# Patient Record
Sex: Male | Born: 1950
Health system: Southern US, Community
[De-identification: ages and names within clinical notes are randomized; demographics above are authoritative.]

## PROBLEM LIST (undated history)

## (undated) DIAGNOSIS — E669 Obesity, unspecified: Secondary | ICD-10-CM

## (undated) DIAGNOSIS — H409 Unspecified glaucoma: Secondary | ICD-10-CM

## (undated) DIAGNOSIS — H269 Unspecified cataract: Secondary | ICD-10-CM

## (undated) DIAGNOSIS — I714 Abdominal aortic aneurysm, without rupture, unspecified: Secondary | ICD-10-CM

## (undated) DIAGNOSIS — F410 Panic disorder [episodic paroxysmal anxiety] without agoraphobia: Secondary | ICD-10-CM

## (undated) DIAGNOSIS — E1142 Type 2 diabetes mellitus with diabetic polyneuropathy: Principal | ICD-10-CM

## (undated) DIAGNOSIS — E785 Hyperlipidemia, unspecified: Secondary | ICD-10-CM

## (undated) DIAGNOSIS — K219 Gastro-esophageal reflux disease without esophagitis: Secondary | ICD-10-CM

## (undated) DIAGNOSIS — R0683 Snoring: Secondary | ICD-10-CM

## (undated) DIAGNOSIS — F329 Major depressive disorder, single episode, unspecified: Secondary | ICD-10-CM

## (undated) DIAGNOSIS — R7989 Other specified abnormal findings of blood chemistry: Secondary | ICD-10-CM

## (undated) DIAGNOSIS — E119 Type 2 diabetes mellitus without complications: Secondary | ICD-10-CM

## (undated) HISTORY — DX: Major depressive disorder, single episode, unspecified: F32.9

## (undated) HISTORY — DX: Panic disorder (episodic paroxysmal anxiety): F41.0

## (undated) HISTORY — PX: OTHER SURGICAL HISTORY: SHX169

## (undated) HISTORY — DX: Snoring: R06.83

## (undated) HISTORY — DX: Abdominal aortic aneurysm, without rupture, unspecified: I71.40

## (undated) HISTORY — DX: Type 2 diabetes mellitus without complications: E11.9

## (undated) HISTORY — DX: Unspecified glaucoma: H40.9

## (undated) HISTORY — DX: Other specified abnormal findings of blood chemistry: R79.89

## (undated) HISTORY — DX: Hyperlipidemia, unspecified: E78.5

## (undated) HISTORY — DX: Obesity, unspecified: E66.9

## (undated) HISTORY — DX: Type 2 diabetes mellitus with diabetic polyneuropathy: E11.42

## (undated) HISTORY — DX: Unspecified cataract: H26.9

## (undated) HISTORY — DX: Gastro-esophageal reflux disease without esophagitis: K21.9

---

## 2001-03-17 ENCOUNTER — Emergency Department (HOSPITAL_COMMUNITY): Admission: EM | Admit: 2001-03-17 | Discharge: 2001-03-17 | Payer: Self-pay | Admitting: Emergency Medicine

## 2001-03-27 ENCOUNTER — Emergency Department (HOSPITAL_COMMUNITY): Admission: EM | Admit: 2001-03-27 | Discharge: 2001-03-27 | Payer: Self-pay | Admitting: Emergency Medicine

## 2004-03-13 ENCOUNTER — Ambulatory Visit: Admission: RE | Admit: 2004-03-13 | Discharge: 2004-03-13 | Payer: Self-pay | Admitting: Family Medicine

## 2004-03-20 ENCOUNTER — Ambulatory Visit: Admission: RE | Admit: 2004-03-20 | Discharge: 2004-03-20 | Payer: Self-pay | Admitting: Family Medicine

## 2011-09-14 ENCOUNTER — Other Ambulatory Visit: Payer: Self-pay | Admitting: Physician Assistant

## 2011-09-14 ENCOUNTER — Other Ambulatory Visit: Payer: Self-pay | Admitting: Family Medicine

## 2011-09-23 ENCOUNTER — Telehealth: Payer: Self-pay | Admitting: Family Medicine

## 2011-09-23 ENCOUNTER — Ambulatory Visit: Payer: Self-pay | Admitting: Family Medicine

## 2011-09-23 DIAGNOSIS — E785 Hyperlipidemia, unspecified: Secondary | ICD-10-CM | POA: Insufficient documentation

## 2011-09-23 DIAGNOSIS — E119 Type 2 diabetes mellitus without complications: Secondary | ICD-10-CM | POA: Insufficient documentation

## 2011-09-23 DIAGNOSIS — E291 Testicular hypofunction: Secondary | ICD-10-CM | POA: Insufficient documentation

## 2011-09-23 DIAGNOSIS — Z021 Encounter for pre-employment examination: Secondary | ICD-10-CM

## 2011-09-23 DIAGNOSIS — Z1211 Encounter for screening for malignant neoplasm of colon: Secondary | ICD-10-CM

## 2011-09-23 LAB — POCT GLYCOSYLATED HEMOGLOBIN (HGB A1C): Hemoglobin A1C: 6.5

## 2011-09-23 LAB — IFOBT (OCCULT BLOOD): IFOBT: NEGATIVE

## 2011-09-23 LAB — PSA: PSA: 1.4 ng/mL (ref <=4.00)

## 2011-09-23 MED ORDER — TESTOSTERONE CYPIONATE 200 MG/ML IM SOLN: 200.0000 mg | INTRAMUSCULAR | Status: DC

## 2011-09-23 MED ORDER — METFORMIN HCL 500 MG PO TABS: 500.0000 mg | ORAL_TABLET | ORAL | Status: DC

## 2011-09-23 MED ORDER — PRAVASTATIN SODIUM 40 MG PO TABS: 40.0000 mg | ORAL_TABLET | Freq: Every day | ORAL | Status: AC

## 2011-09-23 NOTE — Progress Notes (Signed)
  Patient Name: Randy Orr Date of Birth: [birth year] Medical Record Number: [id number] Gender: male Date of Encounter: 09/23/2011  History of Present Illness:  Randy Orr is a 61 y.o. very pleasant male patient who presents with the following:  Here for a physical exam/ follow- up of DM and hypogonadism.  Is having a very hard time with his employment status- has a part- time job but is trying very hard to try something else.  He has applied for a substitute teaching job and hopes this will turn into something permanent.  Finances are very difficult for him and he is currently living with his mother.  He hopes very much that things will improve for him soon.   Patient Active Problem List  Diagnoses  . Diabetes mellitus  . Hypogonadism male  . Hyperlipidemia   Past Medical History  Diagnosis Date  . Panic attacks   . Depression    History reviewed. No pertinent past surgical history. History  Substance Use Topics  . Smoking status: Former Games developer  . Smokeless tobacco: Never Used  . Alcohol Use: No   History reviewed. No pertinent family history. No Known Allergies  Medication list has been reviewed and updated.  Review of Systems: Feeling pretty good despite his situation.  Has not had any alcohol in about a year and a half.    Physical Examination: Filed Vitals:   09/23/11 0850  BP: 113/77  Pulse: 84  Temp: 98.1 F (36.7 C)  TempSrc: Oral  Resp: 16  Height: 6\' 3"  (1.905 m)  Weight: 233 lb 12.8 oz (106.051 kg)    Body mass index is 29.22 kg/(m^2).  GEN: WDWN, NAD, Non-toxic, A & O x 3 HEENT: Atraumatic, Normocephalic. Neck supple. No masses, No LAD. Ears and Nose: No external deformity. CV: RRR, No M/G/R. No JVD. No thrill. No extra heart sounds. PULM: CTA B, no wheezes, crackles, rhonchi. No retractions. No resp. distress. No accessory muscle use. ABD: S, NT, ND, +BS. No rebound. No HSM. GU: normal penis and scrotum/ testicles.  Prostate smooth, normal  size Extr: No c/c/e NEURO Normal gait.  PSYCH: Normally interactive. Conversant. Not depressed or anxious appearing.  Calm demeanor.   Results for orders placed in visit on 09/23/11  POCT GLYCOSYLATED HEMOGLOBIN (HGB A1C)      Component Value Range   Hemoglobin A1C 6.5    IFOBT (OCCULT BLOOD)      Component Value Range   IFOBT Negative      Assessment and Plan: 1. Diabetes mellitus  POCT glycosylated hemoglobin (Hb A1C), metFORMIN (GLUCOPHAGE) 500 MG tablet  2. Hypogonadism male  PSA, testosterone cypionate (DEPOTESTOTERONE CYPIONATE) 200 MG/ML injection  3. Hyperlipidemia  pravastatin (PRAVACHOL) 40 MG tablet  4. Colon cancer screening  IFOBT POC (occult bld, rslt in office)  5. 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   He cannot currently afford to do a colonoscopy, but is aware that he should have one.  Did FOBT testing today which was negative DM is well controlled- continue once daily metformin Restart statin.  Continue testosterone replacement. PSA- follow level. May need urology consultation if not stable.

## 2011-09-23 NOTE — Telephone Encounter (Signed)
Message copied by Kerney Elbe on Mon Sep 23, 2011  4:27 PM ------      Message from: Abbe Amsterdam C      Created: Mon Sep 23, 2011  3:45 PM       Please give me this paper chart.  Thanks!  JC

## 2011-09-25 ENCOUNTER — Encounter: Payer: Self-pay | Admitting: Family Medicine

## 2012-04-23 ENCOUNTER — Ambulatory Visit: Payer: Self-pay | Admitting: Family Medicine

## 2012-04-23 VITALS — BP 118/82 | HR 85 | Temp 98.4°F | Resp 16 | Ht 75.5 in | Wt 228.6 lb

## 2012-04-23 DIAGNOSIS — E291 Testicular hypofunction: Secondary | ICD-10-CM

## 2012-04-23 DIAGNOSIS — E119 Type 2 diabetes mellitus without complications: Secondary | ICD-10-CM

## 2012-04-23 LAB — POCT GLYCOSYLATED HEMOGLOBIN (HGB A1C): Hemoglobin A1C: 6.1

## 2012-04-23 MED ORDER — TESTOSTERONE CYPIONATE 200 MG/ML IM SOLN: 200.0000 mg | INTRAMUSCULAR | Status: DC

## 2012-04-23 NOTE — Progress Notes (Signed)
[facility] [address], [place] [address] [phone]  Date:  04/23/2012   Name:  Randy Orr   DOB:  27-Aug-1950   MRN:  [id number]  PCP:  No primary provider on file.    Chief Complaint: Follow-up   History of Present Illness:  Randy Orr is a 61 y.o. very pleasant male patient who presents with the following:  Here today to do blood work.  He will be getting insurance in about 2.5 months as he has a new job!  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   He last had his testosterone level checked about a year ago and it was ok.  He last had a CBC and PSA in February of this year  [name] feels well and that his testosterone level is ok- his hypogonadism symptoms are controlled  He does have DM but his A1c is under contol  Patient Active Problem List  Diagnosis  . Diabetes mellitus  . Hypogonadism male  . Hyperlipidemia    Past Medical History  Diagnosis Date  . Panic attacks   . Depression     No past surgical history on file.  History  Substance Use Topics  . Smoking status: Former Games developer  . Smokeless tobacco: Never Used  . Alcohol Use: No    No family history on file.  No Known Allergies  Medication list has been reviewed and updated.  Current Outpatient Prescriptions on File Prior to Visit  Medication Sig Dispense Refill  . metFORMIN (GLUCOPHAGE) 500 MG tablet Take 1 tablet (500 mg total) by mouth 1 day or 1 dose.  90 tablet  3  . pravastatin (PRAVACHOL) 40 MG tablet Take 1 tablet (40 mg total) by mouth daily.  90 tablet  3  . testosterone cypionate (DEPOTESTOTERONE CYPIONATE) 200 MG/ML injection Inject 1 mL (200 mg total) into the muscle every 14 (fourteen) days.  10 mL  5    Review of Systems:  As per HPI- otherwise negative.   Physical Examination: Filed Vitals:   04/23/12 0948  BP: 118/82  Pulse: 85  Temp: 98.4 F (36.9 C)  Resp: 16    Filed Vitals:   04/23/12 0948  Height: 6' 3.5" (1.918 m)  Weight: 228 lb 10.1 oz (103.706 kg)   Body mass index is 28.20 kg/(m^2). Ideal Body Weight: Weight in (lb) to have BMI = 25: 202.3   GEN: WDWN, NAD, Non-toxic, A & O x 3, mild overweight HEENT: Atraumatic, Normocephalic. Neck supple. No masses, No LAD. Ears and Nose: No external deformity. CV: RRR, No M/G/R. No JVD. No thrill. No extra heart sounds. PULM: CTA B, no wheezes, crackles, rhonchi. No retractions. No resp. distress. No accessory muscle use. EXTR: No c/c/e NEURO Normal gait.  PSYCH: Normally interactive. Conversant. Not depressed or anxious appearing.  Calm demeanor.   Results for orders placed in visit on 04/23/12  POCT GLYCOSYLATED HEMOGLOBIN (HGB A1C)      Component Value Range   Hemoglobin A1C 6.1      Assessment and Plan: 1. Diabetes mellitus type II  POCT glycosylated hemoglobin (Hb A1C)  2. Hypogonadism male  testosterone cypionate (DEPOTESTOTERONE CYPIONATE) 200 MG/ML injection   DM is controlled.  We need to recheck lipids/ PSA/ cholesterol/ testosterone.  However, he should have insurance within abou 2 months so we can delay until the .  He will RTC for labs once  his insurance is in place.  Let me know if any problems in the meantime   COPLAND,JESSICA, MD

## 2012-09-02 ENCOUNTER — Encounter: Payer: Self-pay | Admitting: Family Medicine

## 2012-09-02 ENCOUNTER — Ambulatory Visit: Payer: PRIVATE HEALTH INSURANCE

## 2012-09-02 ENCOUNTER — Ambulatory Visit (INDEPENDENT_AMBULATORY_CARE_PROVIDER_SITE_OTHER): Payer: PRIVATE HEALTH INSURANCE | Admitting: Family Medicine

## 2012-09-02 VITALS — BP 114/77 | HR 74 | Temp 98.1°F | Resp 18 | Ht 75.75 in | Wt 224.5 lb

## 2012-09-02 DIAGNOSIS — M79673 Pain in unspecified foot: Secondary | ICD-10-CM

## 2012-09-02 DIAGNOSIS — N529 Male erectile dysfunction, unspecified: Secondary | ICD-10-CM

## 2012-09-02 DIAGNOSIS — B001 Herpesviral vesicular dermatitis: Secondary | ICD-10-CM

## 2012-09-02 DIAGNOSIS — E119 Type 2 diabetes mellitus without complications: Secondary | ICD-10-CM

## 2012-09-02 DIAGNOSIS — M79609 Pain in unspecified limb: Secondary | ICD-10-CM

## 2012-09-02 DIAGNOSIS — E291 Testicular hypofunction: Secondary | ICD-10-CM

## 2012-09-02 DIAGNOSIS — E785 Hyperlipidemia, unspecified: Secondary | ICD-10-CM

## 2012-09-02 LAB — POCT CBC
HCT, POC: 47.1 % (ref 43.5–53.7)
Hemoglobin: 15.2 g/dL (ref 14.1–18.1)
MCH, POC: 31.1 pg (ref 27–31.2)
MCV: 96.3 fL (ref 80–97)
MID (cbc): 0.6 (ref 0–0.9)
RBC: 4.89 M/uL (ref 4.69–6.13)
WBC: 5.6 10*3/uL (ref 4.6–10.2)

## 2012-09-02 LAB — LIPID PANEL
Cholesterol: 153 mg/dL (ref 0–200)
HDL: 37 mg/dL — ABNORMAL LOW (ref >39)
Total CHOL/HDL Ratio: 4.1 Ratio
Triglycerides: 105 mg/dL (ref <150)
VLDL: 21 mg/dL (ref 0–40)

## 2012-09-02 LAB — COMPREHENSIVE METABOLIC PANEL
Alkaline Phosphatase: 39 U/L (ref 39–117)
BUN: 13 mg/dL (ref 6–23)
CO2: 28 mEq/L (ref 19–32)
Glucose, Bld: 100 mg/dL — ABNORMAL HIGH (ref 70–99)
Total Bilirubin: 0.4 mg/dL (ref 0.3–1.2)

## 2012-09-02 MED ORDER — TADALAFIL 5 MG PO TABS: 5.0000 mg | ORAL_TABLET | ORAL | Status: DC | PRN

## 2012-09-02 MED ORDER — METFORMIN HCL 500 MG PO TABS: 500.0000 mg | ORAL_TABLET | ORAL | Status: DC

## 2012-09-02 MED ORDER — TESTOSTERONE CYPIONATE 200 MG/ML IM SOLN: 200.0000 mg | INTRAMUSCULAR | Status: DC

## 2012-09-02 MED ORDER — VALACYCLOVIR HCL 1 G PO TABS: ORAL_TABLET | ORAL | Status: DC

## 2012-09-02 MED ORDER — TADALAFIL 5 MG PO TABS: 5.0000 mg | ORAL_TABLET | Freq: Every day | ORAL | Status: DC

## 2012-09-02 NOTE — Progress Notes (Signed)
[facility] [address], [place] [address] [phone]  Date:  09/02/2012   Name:  Randy Orr   DOB:  05/26/1951   MRN:  [id number]  PCP:  Abbe Amsterdam, MD    Chief Complaint: Follow-up, Chills, Generalized Body Aches, Cough and Mouth Lesions   History of Present Illness:  Randy Orr is a 62 y.o. very pleasant male patient who presents with the following:  Here for PSA/ fasting BW as well as illness today.  He is maintained on testosterone replacement therapy.    He was ill over the weekend with chills, aches, cough and ST.  He did have a fever- subjective. He felt pretty bad but thinks he is getting over it now.  He started to feel better 2 days ago.    He has health insurance now- he is very pleased about this.  He would like to catch up on some health issues.    He had noted lateral right foot pain- he had looked online and thought he might have "cuboid syndrome."  This has been a problem for abut 9 months.  There was no known acute injury.  It is feeling better than it had been.   He is using injectable testosterone right now.  He was finally able to do the injection himself at home.  He uses 200 mg every 2 weeks.  He is not sure if he wants to go back on androgel or not as he is concerned about transfer to others- He also met a new woman and is interested in pursing a relationship. He is concerned about possible contact to others with topical testosterone so he would like to stay with the injectable medication at least for now.    He might like to try some cialis or another similar medication in anticipation of becoming sexually active again.    He does not have heart disease, no history of CP.    He also has a recent cold sore in association with his illness and would like a medication for this if possible  Patient Active Problem List  Diagnosis  . Diabetes mellitus  . Hypogonadism male  . Hyperlipidemia    Past Medical History    Diagnosis Date  . Panic attacks   . Depression   . Low testosterone     History reviewed. No pertinent past surgical history.  History  Substance Use Topics  . Smoking status: Former Games developer  . Smokeless tobacco: Never Used  . Alcohol Use: No    Family History  Problem Relation Age of Onset  . Diabetes Brother     No Known Allergies  Medication list has been reviewed and updated.  Current Outpatient Prescriptions on File Prior to Visit  Medication Sig Dispense Refill  . metFORMIN (GLUCOPHAGE) 500 MG tablet Take 1 tablet (500 mg total) by mouth 1 day or 1 dose.  90 tablet  3  . pravastatin (PRAVACHOL) 40 MG tablet Take 1 tablet (40 mg total) by mouth daily.  90 tablet  3  . testosterone cypionate (DEPOTESTOTERONE CYPIONATE) 200 MG/ML injection Inject 1 mL (200 mg total) into the muscle every 14 (fourteen) days.  10 mL  5    Review of Systems:  As per HPI- otherwise negative.  Physical Examination: Filed Vitals:   09/02/12 0806  BP: 114/77  Pulse: 74  Temp: 98.1 F (36.7 C)  Resp: 18   Filed Vitals:   09/02/12 0806  Height: 6' 3.75" (1.924 m)  Weight: 224 lb 8 oz (101.833 kg)   Body mass index is 27.51 kg/(m^2). Ideal Body Weight: Weight in (lb) to have BMI = 25: 203.6   GEN: WDWN, NAD, Non-toxic, A & O x 3 HEENT: Atraumatic, Normocephalic. Neck supple. No masses, No LAD. Bilateral TM wnl, oropharynx normal.  PEERL,EOMI.   Ears and Nose: No external deformity. CV: RRR, No M/G/R. No JVD. No thrill. No extra heart sounds. PULM: CTA B, no wheezes, crackles, rhonchi. No retractions. No resp. distress. No accessory muscle use. ABD: S, NT, ND, +BS. No rebound. No HSM. EXTR: No c/c/e NEURO Normal gait.  PSYCH: Normally interactive. Conversant. Not depressed or anxious appearing.  Calm demeanor.  Right foot- no tenderness, swelling, or redness noted.  Foot and ankle with normal ROM [facility] reading (PRIMARY) by  Dr. Patsy Lager. Right foot:  Normal RIGHT FOOT COMPLETE -  3+ VIEW  Comparison: Preliminary reading of Dr. Patsy Lager  Findings: Three views of the right foot submitted. No acute fracture or subluxation. No radiopaque foreign body.  IMPRESSION: No acute fracture or subluxation.   Results for orders placed in visit on 09/02/12  POCT CBC      Component Value Range   WBC 5.6  4.6 - 10.2 K/uL   Lymph, poc 1.9  0.6 - 3.4   POC LYMPH PERCENT 33.7  10 - 50 %L   MID (cbc) 0.6  0 - 0.9   POC MID % 11.5  0 - 12 %M   POC Granulocyte 3.1  2 - 6.9   Granulocyte percent 54.8  37 - 80 %G   RBC 4.89  4.69 - 6.13 M/uL   Hemoglobin 15.2  14.1 - 18.1 g/dL   HCT, POC 16.1  09.6 - 53.7 %   MCV 96.3  80 - 97 fL   MCH, POC 31.1  27 - 31.2 pg   MCHC 32.3  31.8 - 35.4 g/dL   RDW, POC 04.5     Platelet Count, POC 234  142 - 424 K/uL   MPV 8.7  0 - 99.8 fL  POCT GLYCOSYLATED HEMOGLOBIN (HGB A1C)      Component Value Range   Hemoglobin A1C 6.2     Assessment and Plan: 1. Hypogonadism male  POCT CBC, PSA, Testosterone, testosterone cypionate (DEPOTESTOTERONE CYPIONATE) 200 MG/ML injection  2. Diabetes mellitus, type 2  POCT glycosylated hemoglobin (Hb A1C), Lipid panel, Comprehensive metabolic panel, metFORMIN (GLUCOPHAGE) 500 MG tablet  3. Foot pain  DG Foot Complete Right  4. Hyperlipidemia  Lipid panel  5. ED (erectile dysfunction)  tadalafil (CIALIS) 5 MG tablet   Await the rest of his labs today.  He is on T replacement, check T level today and refilled his medication DM is very well controlled.  Continue metformin Chronic foot pain.  Normal plain films today.  Offered to refer him to ortho but he would prefer to observe for now.  Let me know if getting worse Continue pravachol and await FLP Will try cialis daily dosing- See patient instructions for more details.   He does not use nitro or have any known CAD Gave valtrex Rx to use prn cold sore  Meds ordered this encounter  Medications  . Multiple Vitamin (MULTIVITAMIN) tablet    Sig: Take 1 tablet  by mouth daily.  Marland Kitchen DISCONTD: tadalafil (CIALIS) 5 MG tablet    Sig: Take 1 tablet (5 mg total) by mouth every other day as needed for erectile dysfunction.    Dispense:  30  tablet    Refill:  11  . metFORMIN (GLUCOPHAGE) 500 MG tablet    Sig: Take 1 tablet (500 mg total) by mouth 1 day or 1 dose.    Dispense:  90 tablet    Refill:  3  . testosterone cypionate (DEPOTESTOTERONE CYPIONATE) 200 MG/ML injection    Sig: Inject 1 mL (200 mg total) into the muscle every 14 (fourteen) days.    Dispense:  10 mL    Refill:  5  . tadalafil (CIALIS) 5 MG tablet    Sig: Take 1 tablet (5 mg total) by mouth daily.    Dispense:  30 tablet    Refill:  11  . valACYclovir (VALTREX) 1000 MG tablet    Sig: Take 2 pills in 2 doses, 12 hours apart.    Dispense:  20 tablet    Refill:  0     He stopped taking his pravachol several weeks ago- he has trouble with leg cramps when taking this medication COPLAND,JESSICA, MD

## 2012-09-02 NOTE — Patient Instructions (Addendum)
I will be in touch with the rest of your labs.  Try taking 1/2 of the daily cialis pill first- if that is not enough try taking a whole pill

## 2012-09-08 ENCOUNTER — Ambulatory Visit (INDEPENDENT_AMBULATORY_CARE_PROVIDER_SITE_OTHER): Payer: PRIVATE HEALTH INSURANCE | Admitting: Family Medicine

## 2012-09-08 VITALS — BP 111/73 | HR 103 | Temp 98.5°F | Resp 16 | Ht 73.5 in | Wt 224.0 lb

## 2012-09-08 DIAGNOSIS — R05 Cough: Secondary | ICD-10-CM

## 2012-09-08 DIAGNOSIS — R059 Cough, unspecified: Secondary | ICD-10-CM

## 2012-09-08 DIAGNOSIS — J310 Chronic rhinitis: Secondary | ICD-10-CM

## 2012-09-08 DIAGNOSIS — J019 Acute sinusitis, unspecified: Secondary | ICD-10-CM

## 2012-09-08 MED ORDER — HYDROCODONE-HOMATROPINE 5-1.5 MG/5ML PO SYRP: 5.0000 mL | ORAL_SOLUTION | Freq: Every evening | ORAL | Status: DC | PRN

## 2012-09-08 MED ORDER — AMOXICILLIN-POT CLAVULANATE 875-125 MG PO TABS: 1.0000 | ORAL_TABLET | Freq: Two times a day (BID) | ORAL | Status: DC

## 2012-09-08 MED ORDER — BENZONATATE 100 MG PO CAPS: 200.0000 mg | ORAL_CAPSULE | Freq: Two times a day (BID) | ORAL | Status: AC | PRN

## 2012-09-08 MED ORDER — IPRATROPIUM BROMIDE 0.03 % NA SOLN: 2.0000 | Freq: Two times a day (BID) | NASAL | Status: DC

## 2012-09-08 NOTE — Progress Notes (Signed)
[facility]:  Office Visit  Chief Complaint:  Chief Complaint  Patient presents with  . Cough  . Sinusitis  . Nasal Congestion    with green mucus    HPI: Randy Orr is a 62 y.o. male who complains of  Cough mostly dry, sinus congestion, mucus is green, fever. Aleve, Claritin and MUcinex D. BIlateral maxillary pain. NO ear pain. No throat pain. Has hoarsness, he is an Radio producer and cannot sing.   Past Medical History  Diagnosis Date  . Panic attacks   . Depression   . Low testosterone    No past surgical history on file. History   Social History  . Marital Status: Divorced    Spouse Name: N/A    Number of Children: N/A  . Years of Education: N/A   Social History Main Topics  . Smoking status: Former Games developer  . Smokeless tobacco: Never Used  . Alcohol Use: No  . Drug Use: No  . Sexually Active: None   Other Topics Concern  . None   Social History Narrative  . None   Family History  Problem Relation Age of Onset  . Diabetes Brother    No Known Allergies Prior to Admission medications   Medication Sig Start Date End Date Taking? Authorizing Provider  metFORMIN (GLUCOPHAGE) 500 MG tablet Take 1 tablet (500 mg total) by mouth 1 day or 1 dose. 09/02/12 09/02/13  Pearline Cables, MD  Multiple Vitamin (MULTIVITAMIN) tablet Take 1 tablet by mouth daily.    Historical Provider, MD  pravastatin (PRAVACHOL) 40 MG tablet Take 1 tablet (40 mg total) by mouth daily. 09/23/11 09/22/12  Gwenlyn Found Copland, MD  tadalafil (CIALIS) 5 MG tablet Take 1 tablet (5 mg total) by mouth daily. 09/02/12   Pearline Cables, MD  testosterone cypionate (DEPOTESTOTERONE CYPIONATE) 200 MG/ML injection Inject 1 mL (200 mg total) into the muscle every 14 (fourteen) days. 09/02/12   Gwenlyn Found Copland, MD  valACYclovir (VALTREX) 1000 MG tablet Take 2 pills in 2 doses, 12 hours apart. 09/02/12   Gwenlyn Found Copland, MD     ROS: The patient denies night sweats, unintentional weight  loss, chest pain, palpitations, wheezing, dyspnea on exertion, nausea, vomiting, abdominal pain, dysuria, hematuria, melena, numbness, weakness, or tingling.  All other systems have been reviewed and were otherwise negative with the exception of those mentioned in the HPI and as above.    PHYSICAL EXAM: Filed Vitals:   09/08/12 1738  BP: 111/73  Pulse: 103  Temp: 98.5 F (36.9 C)  Resp: 16   Filed Vitals:   09/08/12 1738  Height: 6' 1.5" (1.867 m)  Weight: 224 lb (101.606 kg)   Body mass index is 29.15 kg/(m^2).  General: Alert, no acute distress HEENT:  Normocephalic, atraumatic, oropharynx patent. + sinus tenderness, TM nl, + boggy nares, no exudates, TM nl Cardiovascular:  Regular rate and rhythm, no rubs murmurs or gallops.  No Carotid bruits, radial pulse intact. No pedal edema.  Respiratory: Clear to auscultation bilaterally.  No wheezes, rales, or rhonchi.  No cyanosis, no use of accessory musculature GI: No organomegaly, abdomen is soft and non-tender, positive bowel sounds.  No masses. Skin: No rashes. Neurologic: Facial musculature symmetric. Psychiatric: Patient is appropriate throughout our interaction. Lymphatic: No cervical lymphadenopathy Musculoskeletal: Gait intact.   LABS: Results for orders placed in visit on 09/02/12  POCT CBC      Component Value Range   WBC 5.6  4.6 - 10.2  K/uL   Lymph, poc 1.9  0.6 - 3.4   POC LYMPH PERCENT 33.7  10 - 50 %L   MID (cbc) 0.6  0 - 0.9   POC MID % 11.5  0 - 12 %M   POC Granulocyte 3.1  2 - 6.9   Granulocyte percent 54.8  37 - 80 %G   RBC 4.89  4.69 - 6.13 M/uL   Hemoglobin 15.2  14.1 - 18.1 g/dL   HCT, POC 16.1  09.6 - 53.7 %   MCV 96.3  80 - 97 fL   MCH, POC 31.1  27 - 31.2 pg   MCHC 32.3  31.8 - 35.4 g/dL   RDW, POC 04.5     Platelet Count, POC 234  142 - 424 K/uL   MPV 8.7  0 - 99.8 fL  POCT GLYCOSYLATED HEMOGLOBIN (HGB A1C)      Component Value Range   Hemoglobin A1C 6.2    PSA      Component Value Range     PSA 2.16  <=4.00 ng/mL  LIPID PANEL      Component Value Range   Cholesterol 153  0 - 200 mg/dL   Triglycerides 409  <811 mg/dL   HDL 37 (*) >91 mg/dL   Total CHOL/HDL Ratio 4.1     VLDL 21  0 - 40 mg/dL   LDL Cholesterol 95  0 - 99 mg/dL  TESTOSTERONE      Component Value Range   Testosterone 373.64  300 - 890 ng/dL  COMPREHENSIVE METABOLIC PANEL      Component Value Range   Sodium 137  135 - 145 mEq/L   Potassium 4.2  3.5 - 5.3 mEq/L   Chloride 103  96 - 112 mEq/L   CO2 28  19 - 32 mEq/L   Glucose, Bld 100 (*) 70 - 99 mg/dL   BUN 13  6 - 23 mg/dL   Creat 4.78  2.95 - 6.21 mg/dL   Total Bilirubin 0.4  0.3 - 1.2 mg/dL   Alkaline Phosphatase 39  39 - 117 U/L   AST 31  0 - 37 U/L   ALT 37  0 - 53 U/L   Total Protein 6.1  6.0 - 8.3 g/dL   Albumin 4.0  3.5 - 5.2 g/dL   Calcium 8.6  8.4 - 30.8 mg/dL     EKG/XRAY:   Primary read interpreted by Dr. Conley Rolls at [facility].   ASSESSMENT/PLAN: Encounter Diagnoses  Name Primary?  . Acute sinusitis Yes  . Rhinitis   . Cough    Augmentin Hydromet Tessalon PErles Atrovent NS F/u prn    Randy Orr PHUONG, DO 09/09/2012 11:28 AM

## 2012-09-10 ENCOUNTER — Telehealth: Payer: Self-pay

## 2012-09-10 DIAGNOSIS — E291 Testicular hypofunction: Secondary | ICD-10-CM

## 2012-09-10 NOTE — Telephone Encounter (Signed)
PT HAD LABS DONE AND WOULD LIKE TO DISCUSS WITH DR [place] THE RESULTS PLEASE CALL [phone]

## 2012-09-10 NOTE — Telephone Encounter (Signed)
Called pt and LMOM that the labs look good and that Dr Patsy Lager had sent him a letter/copy last week, apologized that he has not received it and advised we have resent it to him. Asked for CB w/any specific?s.

## 2012-09-10 NOTE — Telephone Encounter (Signed)
Please give him a call- labs looked good, I sent a letter last week.  Something must have happened to it, so will resend today.  Sorry for the delay!

## 2012-09-10 NOTE — Addendum Note (Signed)
Addended by: Sheppard Plumber A on: 09/10/2012 03:41 PM   Modules accepted: Orders

## 2012-09-10 NOTE — Telephone Encounter (Signed)
Review labs and advise what I should tell pt.

## 2012-09-10 NOTE — Telephone Encounter (Signed)
Pt CB and has a concern because his testosterone level is so much lower than it was when he was halfway between injections at the last lab test. Pt has also noticed a difference in the way he feels w/his testosterone level running lower. Pt requests to be changed back to the Androgel 1% packs that he used to be on w/better success. He was increased to using #2 of the 5 gm packs QD. Dr Patsy Lager is it OK to send in a Rx for this to [facility]? I have pended it for your approval.

## 2012-09-10 NOTE — Telephone Encounter (Signed)
Dr Patsy Lager, I believe these are the 1% Androgel packs that I have pended. I did not put any RFs on Rx, please fill in the # you want to give him.

## 2012-09-11 MED ORDER — TESTOSTERONE 50 MG/5GM (1%) TD GEL: 10.0000 g | Freq: Every day | TRANSDERMAL | Status: DC

## 2012-09-11 NOTE — Telephone Encounter (Signed)
Filled the androgel rx with one RF, called and let him know.  Let's check his T in 4- 6 weeks.  Left message on machine with this infor

## 2012-09-11 NOTE — Addendum Note (Signed)
Addended by: Abbe Amsterdam C on: 09/11/2012 08:47 PM   Modules accepted: Orders

## 2012-10-01 NOTE — Progress Notes (Unsigned)
I completed a prior auth for pt's Rx for Cialis 5 mg taken daily. PA was denied d/t they only cover #4/30 days. I have put the denial letter in Dr Cyndie Chime box. Dr Patsy Lager do you want to change the Rx quantity and sig?

## 2012-10-02 ENCOUNTER — Telehealth: Payer: Self-pay | Admitting: Family Medicine

## 2012-10-02 DIAGNOSIS — N529 Male erectile dysfunction, unspecified: Secondary | ICD-10-CM

## 2012-10-02 MED ORDER — TADALAFIL 20 MG PO TABS: ORAL_TABLET | ORAL | Status: DC

## 2012-10-02 NOTE — Progress Notes (Signed)
Changed rx for him, left message on his phone

## 2012-10-02 NOTE — Progress Notes (Signed)
Called and LMOM for pt- I had given him a one month free voucher- was he able to get the rx and start using it, and if so how is it going? If not we need to talk about doing prn dosing instead

## 2012-10-02 NOTE — Telephone Encounter (Signed)
Changed to prn dosing of cialis and erx for him

## 2012-10-02 NOTE — Progress Notes (Unsigned)
Pt CB and stated he had gotten a message from Dr Patsy Lager but could not understand it d/t the connection being garbled. I explained PA being denied for daily dosing of Cialis and pt does request that we send in a Rx for the #4 per mos. I did not have this message open while speaking w/pt so I did not get to ask him if he was able to get the 1 mos free from voucher. Do you want to Rx the prn dosing?

## 2012-10-04 ENCOUNTER — Telehealth: Payer: Self-pay

## 2012-10-04 NOTE — Telephone Encounter (Signed)
Pt is calling because he has had a Insurance change and the medicine he is on is not covered and wants to know if Dr. Patsy Lager would be able to do something so he could still be able to take the medication and for to be covered.

## 2012-10-05 ENCOUNTER — Telehealth: Payer: Self-pay | Admitting: *Deleted

## 2012-10-05 NOTE — Telephone Encounter (Signed)
Androgel is not covered. He states injectable testosterone is difficult for him because he can not inject himself. He wants to know if we can try to get this medication approved. He will go to the pharmacy and see if he can have them send the forms to [place] for prior authorization. He states if this does not work he will come here to have [place] do his injections.

## 2012-10-05 NOTE — Telephone Encounter (Signed)
Which medication? I have left message for him to call me back.

## 2012-10-05 NOTE — Telephone Encounter (Signed)
Only need return call if his new script for Cialis is not appropriate.

## 2012-10-09 ENCOUNTER — Ambulatory Visit (INDEPENDENT_AMBULATORY_CARE_PROVIDER_SITE_OTHER): Payer: PRIVATE HEALTH INSURANCE | Admitting: Emergency Medicine

## 2012-10-09 ENCOUNTER — Ambulatory Visit: Payer: PRIVATE HEALTH INSURANCE

## 2012-10-09 VITALS — BP 142/88 | HR 75 | Temp 97.5°F | Resp 18 | Ht 75.75 in | Wt 225.8 lb

## 2012-10-09 DIAGNOSIS — J3489 Other specified disorders of nose and nasal sinuses: Secondary | ICD-10-CM

## 2012-10-09 DIAGNOSIS — R059 Cough, unspecified: Secondary | ICD-10-CM

## 2012-10-09 DIAGNOSIS — R0981 Nasal congestion: Secondary | ICD-10-CM

## 2012-10-09 DIAGNOSIS — J329 Chronic sinusitis, unspecified: Secondary | ICD-10-CM

## 2012-10-09 MED ORDER — FLUTICASONE PROPIONATE 50 MCG/ACT NA SUSP: 2.0000 | Freq: Every day | NASAL | Status: DC

## 2012-10-09 MED ORDER — AZITHROMYCIN 250 MG PO TABS: ORAL_TABLET | ORAL | Status: DC

## 2012-10-09 MED ORDER — PREDNISONE 20 MG PO TABS: ORAL_TABLET | ORAL | Status: DC

## 2012-10-09 NOTE — Patient Instructions (Addendum)

## 2012-10-09 NOTE — Progress Notes (Signed)
  Subjective:    Patient ID: Randy Orr, male    DOB: [birth year], 62 y.o.   MRN: [id number]  HPI patient enters with the chief complaint of head congestion and dry cough. He has already been seen twice for this illness . He has been treated with Atrovent inhaler Tessalon Perles and received a course of amoxicillin. He continues to feel poorly but mainly of having head congestion. He is unable to breathe. He has minimal cough but has had a colored nasal drainage    Review of Systems is a nonsmoker     Objective:   Physical Exam HEENT exam reveals nasal congestion. TMs are dull. Throat is normal. Chest is clear to both auscultation and percussion  [facility] reading (PRIMARY) by  Dr. Luciana Axe x-ray has a possible retrocardiac infiltrate on lateral. Sinus films revealed thickening on the left          Assessment & Plan:   Patient here with an acute sinusitis we'll treat with Zithromax for antibiotic coverage since he has done well with this before along with Flonase nasal spray and a prednisone taper.

## 2012-10-11 ENCOUNTER — Encounter: Payer: Self-pay | Admitting: Family Medicine

## 2012-10-12 ENCOUNTER — Telehealth: Payer: Self-pay

## 2012-10-12 NOTE — Telephone Encounter (Signed)
I had faxed completed prior auth form to [facility] on 10/06/12 for pt's Androgel 1% 2 pkgs QD. Received approval from 10/08/12 - 10/08/13 and notified pt. Faxed approval notice to pharmacy.  Dr Patsy Lager, forwarding this FYI. Pt had sent you a MyChart's email, and he is aware that I am notifying you of the approval.

## 2012-11-09 ENCOUNTER — Other Ambulatory Visit: Payer: Self-pay | Admitting: Family Medicine

## 2012-11-09 ENCOUNTER — Telehealth: Payer: Self-pay

## 2012-11-09 DIAGNOSIS — E291 Testicular hypofunction: Secondary | ICD-10-CM

## 2012-11-09 NOTE — Telephone Encounter (Signed)
He should be aware this is a controlled substance, it is the law, we must follow the law. He is asking for 12 month refill. I have advised him there are regulations regarding how often we can renew this for him.

## 2012-11-09 NOTE — Telephone Encounter (Signed)
PT ISN'T VERY HAPPY ABOUT EVERYTIME HE GOES TO THE PHARMACY FOR HIS ANDROGEL  STATES HE IS GETTING TIRED OF THIS. PLEASE CALL [phone] IT HAVE ALREADY BEEN APPROVED BY THE INSURANCE COMPANY FROM DR COPLAND SO HE DOESN'T SEE WHAT THE PROBLEM IS

## 2012-11-10 ENCOUNTER — Encounter: Payer: Self-pay | Admitting: Family Medicine

## 2012-11-10 NOTE — Telephone Encounter (Signed)
Called [facility] and confirmed that his androgel is available and ready for pick- up

## 2012-11-20 NOTE — Telephone Encounter (Signed)
Patient was advised, Randy Orr

## 2013-03-10 ENCOUNTER — Ambulatory Visit (INDEPENDENT_AMBULATORY_CARE_PROVIDER_SITE_OTHER): Payer: PRIVATE HEALTH INSURANCE | Admitting: Family Medicine

## 2013-03-10 VITALS — BP 112/68 | HR 68 | Temp 98.0°F | Resp 16 | Ht 76.0 in | Wt 226.0 lb

## 2013-03-10 DIAGNOSIS — E119 Type 2 diabetes mellitus without complications: Secondary | ICD-10-CM

## 2013-03-10 DIAGNOSIS — E785 Hyperlipidemia, unspecified: Secondary | ICD-10-CM

## 2013-03-10 DIAGNOSIS — E291 Testicular hypofunction: Secondary | ICD-10-CM

## 2013-03-10 LAB — POCT CBC
Granulocyte percent: 61.4 %G (ref 37–80)
Hemoglobin: 15.8 g/dL (ref 14.1–18.1)
MCH, POC: 32.4 pg — AB (ref 27–31.2)
MPV: 9.2 fL (ref 0–99.8)
POC MID %: 7.6 %M (ref 0–12)
RBC: 4.87 M/uL (ref 4.69–6.13)
WBC: 5.6 10*3/uL (ref 4.6–10.2)

## 2013-03-10 LAB — LIPID PANEL
Cholesterol: 187 mg/dL (ref 0–200)
HDL: 43 mg/dL (ref >39)
LDL Cholesterol: 126 mg/dL — ABNORMAL HIGH (ref 0–99)
Triglycerides: 90 mg/dL (ref <150)

## 2013-03-10 LAB — BASIC METABOLIC PANEL
BUN: 11 mg/dL (ref 6–23)
CO2: 28 mEq/L (ref 19–32)
Chloride: 105 mEq/L (ref 96–112)
Glucose, Bld: 114 mg/dL — ABNORMAL HIGH (ref 70–99)
Potassium: 4.1 mEq/L (ref 3.5–5.3)

## 2013-03-10 MED ORDER — METFORMIN HCL 500 MG PO TABS: 500.0000 mg | ORAL_TABLET | ORAL | Status: DC

## 2013-03-10 MED ORDER — TESTOSTERONE 50 MG/5GM (1%) TD GEL: 10.0000 g | Freq: Every day | TRANSDERMAL | Status: DC

## 2013-03-10 NOTE — Progress Notes (Signed)
[facility] [address], [place] [address] [phone]  Date:  03/10/2013   Name:  Randy Orr   DOB:  [birth year]   MRN:  [id number]  PCP:  Abbe Amsterdam, MD    Chief Complaint: Follow-up   History of Present Illness:  Randy Orr is a 62 y.o. very pleasant male patient who presents with the following:  Here today for a recheck.  He is doing well- he has a new SO, they moved in together and are thinking of getting married.   He is still trying to act as a career, although he would like to find a steadier form of employment.  However, he has not found much else for work.  His GF does have a good job which helps.   Overall he is happier that he has been in some time.   He has been off his metformin for about 3 days.  He has not had time to get a RF.    He did have some pain in his foot about one year ago.  He now notes some minimal numbness in the toes of his right foot for about one year.  He is not sure if it is there all the time or if it comes and goes. It does not effect his function- he moslty just notices it at night when he pulls the sheet over his toes.    He is fasting today  Patient Active Problem List   Diagnosis Date Noted  . Diabetes mellitus 09/23/2011  . Hypogonadism male 09/23/2011  . Hyperlipidemia 09/23/2011    Past Medical History  Diagnosis Date  . Panic attacks   . Depression   . Low testosterone   . Diabetes mellitus without complication     History reviewed. No pertinent past surgical history.  History  Substance Use Topics  . Smoking status: Former Games developer  . Smokeless tobacco: Never Used  . Alcohol Use: Yes    Family History  Problem Relation Age of Onset  . Diabetes Brother     No Known Allergies  Medication list has been reviewed and updated.  Current Outpatient Prescriptions on File Prior to Visit  Medication Sig Dispense Refill  . metFORMIN (GLUCOPHAGE) 500 MG tablet Take 1 tablet (500 mg total)  by mouth 1 day or 1 dose.  90 tablet  3  . Multiple Vitamin (MULTIVITAMIN) tablet Take 1 tablet by mouth daily.      Marland Kitchen testosterone cypionate (DEPOTESTOTERONE CYPIONATE) 200 MG/ML injection Inject 1 mL (200 mg total) into the muscle every 14 (fourteen) days.  10 mL  5  . triamcinolone (KENALOG) 0.025 % cream Apply topically 2 (two) times daily.      Marland Kitchen amoxicillin-clavulanate (AUGMENTIN) 875-125 MG per tablet Take 1 tablet by mouth 2 (two) times daily.  20 tablet  0  . ANDROGEL 50 MG/5GM GEL APPLY CONTENTS OF 2 PACKS ONTO SKIN ONCE A DAY  300 g  5  . azithromycin (ZITHROMAX) 250 MG tablet Take 2 tabs PO x 1 dose, then 1 tab PO QD x 4 days  6 tablet  0  . fluticasone (FLONASE) 50 MCG/ACT nasal spray Place 2 sprays into the nose daily.  16 g  6  . HYDROcodone-homatropine (HYCODAN) 5-1.5 MG/5ML syrup Take 5 mLs by mouth at bedtime as needed for cough.  120 mL  0  . ipratropium (ATROVENT) 0.03 % nasal spray Place 2 sprays into the nose every 12 (twelve) hours.  30 mL  0  . predniSONE (DELTASONE) 20 MG tablet Take 3 a  Day for 3 days 2 a day for 3 days one a day for 3 days  18 tablet  0  . tadalafil (CIALIS) 20 MG tablet Take 1/2 or 1 tablet as needed daily prior to sexual intercourse.  Max 20 mg in 24 hours.  4 tablet  11  . valACYclovir (VALTREX) 1000 MG tablet Take 2 pills in 2 doses, 12 hours apart.  20 tablet  0   No current facility-administered medications on file prior to visit.    Review of Systems:  As per HPI- otherwise negative.   Physical Examination: Filed Vitals:   03/10/13 0754  BP: 112/68  Pulse: 68  Temp: 98 F (36.7 C)  Resp: 16   Filed Vitals:   03/10/13 0754  Height: 6\' 4"  (1.93 m)  Weight: 226 lb (102.513 kg)   Body mass index is 27.52 kg/(m^2). Ideal Body Weight: Weight in (lb) to have BMI = 25: 205  GEN: WDWN, NAD, Non-toxic, A & O x 3, looks well HEENT: Atraumatic, Normocephalic. Neck supple. No masses, No LAD. Ears and Nose: No external deformity. CV:  RRR, No M/G/R. No JVD. No thrill. No extra heart sounds. PULM: CTA B, no wheezes, crackles, rhonchi. No retractions. No resp. distress. No accessory muscle use. ABD: S, NT, ND, +BS. No rebound. No HSM. EXTR: No c/c/e NEURO Normal gait.  PSYCH: Normally interactive. Conversant. Not depressed or anxious appearing.  Calm demeanor.  Performed diabetic foot exam- normal except he has slight decreased sensation over the right great toe. Can still perceive filament   Results for orders placed in visit on 03/10/13  POCT CBC      Result Value Range   WBC 5.6  4.6 - 10.2 K/uL   Lymph, poc 1.7  0.6 - 3.4   POC LYMPH PERCENT 31.0  10 - 50 %L   MID (cbc) 0.4  0 - 0.9   POC MID % 7.6  0 - 12 %M   POC Granulocyte 3.4  2 - 6.9   Granulocyte percent 61.4  37 - 80 %G   RBC 4.87  4.69 - 6.13 M/uL   Hemoglobin 15.8  14.1 - 18.1 g/dL   HCT, POC 16.1  09.6 - 53.7 %   MCV 98.5 (*) 80 - 97 fL   MCH, POC 32.4 (*) 27 - 31.2 pg   MCHC 32.9  31.8 - 35.4 g/dL   RDW, POC 04.5     Platelet Count, POC 225  142 - 424 K/uL   MPV 9.2  0 - 99.8 fL  POCT GLYCOSYLATED HEMOGLOBIN (HGB A1C)      Result Value Range   Hemoglobin A1C 5.5      Assessment and Plan: Hypogonadism male - Plan: POCT CBC, PSA, Lipid panel, Testosterone, testosterone (ANDROGEL) 50 MG/5GM GEL  Type II or unspecified type diabetes mellitus without mention of complication, not stated as uncontrolled - Plan: POCT glycosylated hemoglobin (Hb A1C), Basic metabolic panel  Diabetes mellitus, type 2 - Plan: metFORMIN (GLUCOPHAGE) 500 MG tablet  DM is well controlled. Await other labs.  Refilled his testosterone.   Overall Randy Orr is doing very well- he will keep up the good work!   Signed Abbe Amsterdam, MD

## 2013-03-10 NOTE — Patient Instructions (Addendum)
I will be in touch with the rest of your labs when they come in.  Keep up the good work!   Let's see you again in about 6 months.   If your foot numbness is getting worse please let me know.

## 2013-03-26 ENCOUNTER — Encounter: Payer: Self-pay | Admitting: Family Medicine

## 2013-05-14 ENCOUNTER — Other Ambulatory Visit: Payer: Self-pay | Admitting: Family Medicine

## 2013-06-17 ENCOUNTER — Other Ambulatory Visit: Payer: Self-pay

## 2013-08-28 ENCOUNTER — Other Ambulatory Visit: Payer: Self-pay | Admitting: Family Medicine

## 2013-09-16 ENCOUNTER — Other Ambulatory Visit: Payer: Self-pay | Admitting: Family Medicine

## 2013-09-16 DIAGNOSIS — E291 Testicular hypofunction: Secondary | ICD-10-CM

## 2013-09-16 NOTE — Telephone Encounter (Signed)
Ok to refill androgel with 3RF.   Please do for me- thanks!

## 2013-09-17 MED ORDER — TESTOSTERONE 50 MG/5GM (1%) TD GEL: 10.0000 g | Freq: Every day | TRANSDERMAL | Status: DC

## 2013-09-17 NOTE — Telephone Encounter (Signed)
Called in RFs. 

## 2013-09-17 NOTE — Addendum Note (Signed)
Addended by: Elwyn Reach A on: 09/17/2013 01:17 PM   Modules accepted: Orders

## 2013-10-20 ENCOUNTER — Ambulatory Visit (INDEPENDENT_AMBULATORY_CARE_PROVIDER_SITE_OTHER): Payer: PRIVATE HEALTH INSURANCE | Admitting: Family Medicine

## 2013-10-20 VITALS — BP 126/78 | HR 77 | Temp 98.4°F | Resp 16 | Ht 76.0 in | Wt 235.2 lb

## 2013-10-20 DIAGNOSIS — Z5181 Encounter for therapeutic drug level monitoring: Secondary | ICD-10-CM

## 2013-10-20 DIAGNOSIS — N529 Male erectile dysfunction, unspecified: Secondary | ICD-10-CM

## 2013-10-20 DIAGNOSIS — H9190 Unspecified hearing loss, unspecified ear: Secondary | ICD-10-CM

## 2013-10-20 DIAGNOSIS — E119 Type 2 diabetes mellitus without complications: Secondary | ICD-10-CM

## 2013-10-20 DIAGNOSIS — E291 Testicular hypofunction: Secondary | ICD-10-CM

## 2013-10-20 DIAGNOSIS — Z23 Encounter for immunization: Secondary | ICD-10-CM

## 2013-10-20 LAB — MICROALBUMIN, URINE: Microalb, Ur: 0.5 mg/dL (ref 0.00–1.89)

## 2013-10-20 LAB — POCT GLYCOSYLATED HEMOGLOBIN (HGB A1C): Hemoglobin A1C: 6.2

## 2013-10-20 LAB — LIPID PANEL
CHOL/HDL RATIO: 4.6 ratio
Cholesterol: 213 mg/dL — ABNORMAL HIGH (ref 0–200)
HDL: 46 mg/dL (ref >39)
LDL CALC: 145 mg/dL — AB (ref 0–99)
TRIGLYCERIDES: 110 mg/dL (ref <150)
VLDL: 22 mg/dL (ref 0–40)

## 2013-10-20 LAB — POCT CBC
Granulocyte percent: 64.3 %G (ref 37–80)
HCT, POC: 47.7 % (ref 43.5–53.7)
Hemoglobin: 16.1 g/dL (ref 14.1–18.1)
Lymph, poc: 2 (ref 0.6–3.4)
MCH, POC: 32.3 pg — AB (ref 27–31.2)
MCHC: 33.8 g/dL (ref 31.8–35.4)
MCV: 95.7 fL (ref 80–97)
MID (CBC): 0.5 (ref 0–0.9)
MPV: 9.3 fL (ref 0–99.8)
PLATELET COUNT, POC: 269 10*3/uL (ref 142–424)
POC Granulocyte: 4.5 (ref 2–6.9)
POC LYMPH PERCENT: 28.3 %L (ref 10–50)
POC MID %: 7.4 % (ref 0–12)
RBC: 4.98 M/uL (ref 4.69–6.13)
RDW, POC: 13 %
WBC: 7 10*3/uL (ref 4.6–10.2)

## 2013-10-20 LAB — COMPREHENSIVE METABOLIC PANEL
ALK PHOS: 49 U/L (ref 39–117)
ALT: 44 U/L (ref 0–53)
AST: 24 U/L (ref 0–37)
Albumin: 4.2 g/dL (ref 3.5–5.2)
BUN: 14 mg/dL (ref 6–23)
CALCIUM: 9.3 mg/dL (ref 8.4–10.5)
CO2: 27 mEq/L (ref 19–32)
CREATININE: 1.1 mg/dL (ref 0.50–1.35)
Chloride: 104 mEq/L (ref 96–112)
Glucose, Bld: 146 mg/dL — ABNORMAL HIGH (ref 70–99)
Potassium: 4.7 mEq/L (ref 3.5–5.3)
Sodium: 139 mEq/L (ref 135–145)
Total Bilirubin: 0.5 mg/dL (ref 0.2–1.2)
Total Protein: 6.8 g/dL (ref 6.0–8.3)

## 2013-10-20 LAB — TESTOSTERONE: Testosterone: 259 ng/dL — ABNORMAL LOW (ref 300–890)

## 2013-10-20 LAB — PSA: PSA: 1.64 ng/mL (ref <=4.00)

## 2013-10-20 MED ORDER — METFORMIN HCL 500 MG PO TABS: 500.0000 mg | ORAL_TABLET | ORAL | Status: DC

## 2013-10-20 MED ORDER — TADALAFIL 20 MG PO TABS: ORAL_TABLET | ORAL | Status: DC

## 2013-10-20 NOTE — Progress Notes (Addendum)
[facility] [address] [place] [address] [phone]  Date:  10/20/2013   Name:  Randy Orr   DOB:  09-17-1950   MRN:  [id number]  PCP:  Lamar Blinks, MD    Chief Complaint: Medication Refill   History of Present Illness:  Randy Orr is a 63 y.o. very pleasant male patient who presents with the following:  Here to discuss labs and medication today.  Last seen in July 2014.   At that time his A1c was 5.5, LDL 126.   PSA has been stable, T low at 216 at last check. However did explain that these numbers are based off of younger controls.  He is taking androgel.  Has noted some ED over the last 6 weeks or so- just some of the time.  He may want to go back to shots for his T replacement due to possible decreased efficacy.  He is also concerned about transferring the testosterone to his fiance.  He is not really using the cialis but might like to have a refill  He is a little concerned about his hearing and would like to have this looked at today.  He has noted some tinnitus and is aware that his hearing is not great.   He has never had a colonoscopy- he is somewhat interested in having this done  He has not had a pneumovax, his last tetanus was in 2005 or 2006.    He ran out of his androgel yesterday Declines flu shot today   Patient Active Problem List   Diagnosis Date Noted  . Diabetes mellitus 09/23/2011  . Hypogonadism male 09/23/2011  . Hyperlipidemia 09/23/2011    Past Medical History  Diagnosis Date  . Panic attacks   . Depression   . Low testosterone   . Diabetes mellitus without complication     History reviewed. No pertinent past surgical history.  History  Substance Use Topics  . Smoking status: Former Research scientist (life sciences)  . Smokeless tobacco: Never Used  . Alcohol Use: Yes    Family History  Problem Relation Age of Onset  . Diabetes Brother     No Known Allergies  Medication list has been reviewed and updated.  Current  Outpatient Prescriptions on File Prior to Visit  Medication Sig Dispense Refill  . metFORMIN (GLUCOPHAGE) 500 MG tablet Take 1 tablet (500 mg total) by mouth 1 day or 1 dose.  90 tablet  3  . Multiple Vitamin (MULTIVITAMIN) tablet Take 1 tablet by mouth daily.      . tadalafil (CIALIS) 20 MG tablet Take 1/2 or 1 tablet as needed daily prior to sexual intercourse.  Max 20 mg in 24 hours.  4 tablet  11  . testosterone (ANDROGEL) 50 MG/5GM GEL Place 10 g onto the skin daily.  300 g  3  . triamcinolone (KENALOG) 0.025 % cream Apply topically 2 (two) times daily.      . fluticasone (FLONASE) 50 MCG/ACT nasal spray Place 2 sprays into the nose daily.  16 g  6  . HYDROcodone-homatropine (HYCODAN) 5-1.5 MG/5ML syrup Take 5 mLs by mouth at bedtime as needed for cough.  120 mL  0  . ipratropium (ATROVENT) 0.03 % nasal spray Place 2 sprays into the nose every 12 (twelve) hours.  30 mL  0  . valACYclovir (VALTREX) 1000 MG tablet Take 2 pills in 2 doses, 12 hours apart.  20 tablet  0   No current facility-administered medications on file prior  to visit.    Review of Systems:  As per HPI- otherwise negative.   Physical Examination: Filed Vitals:   10/20/13 0806  BP: 126/78  Pulse: 77  Temp: 98.4 F (36.9 C)  Resp: 16   Filed Vitals:   10/20/13 0806  Height: 6\' 4"  (1.93 m)  Weight: 235 lb 3.2 oz (106.686 kg)   Body mass index is 28.64 kg/(m^2). Ideal Body Weight: Weight in (lb) to have BMI = 25: 205  GEN: WDWN, NAD, Non-toxic, A & O x 3, looks well HEENT: Atraumatic, Normocephalic. Neck supple. No masses, No LAD.  Bilateral TM wnl, oropharynx normal.  PEERL,EOMI.  Right ear with cerumen impaction- cleared and ear then normal Ears and Nose: No external deformity. CV: RRR, No M/G/R. No JVD. No thrill. No extra heart sounds. PULM: CTA B, no wheezes, crackles, rhonchi. No retractions. No resp. distress. No accessory muscle use. ABD: S, NT, ND, +BS. No rebound. No HSM. EXTR: No c/c/e NEURO  Normal gait.  PSYCH: Normally interactive. Conversant. Not depressed or anxious appearing.  Calm demeanor.  Foot exam today- see coments  Audiometry: see results. Some hearing loss, especially at higher ranges  Results for orders placed in visit on 10/20/13  POCT CBC      Result Value Ref Range   WBC 7.0  4.6 - 10.2 K/uL   Lymph, poc 2.0  0.6 - 3.4   POC LYMPH PERCENT 28.3  10 - 50 %L   MID (cbc) 0.5  0 - 0.9   POC MID % 7.4  0 - 12 %M   POC Granulocyte 4.5  2 - 6.9   Granulocyte percent 64.3  37 - 80 %G   RBC 4.98  4.69 - 6.13 M/uL   Hemoglobin 16.1  14.1 - 18.1 g/dL   HCT, POC 47.7  43.5 - 53.7 %   MCV 95.7  80 - 97 fL   MCH, POC 32.3 (*) 27 - 31.2 pg   MCHC 33.8  31.8 - 35.4 g/dL   RDW, POC 13.0     Platelet Count, POC 269  142 - 424 K/uL   MPV 9.3  0 - 99.8 fL  POCT GLYCOSYLATED HEMOGLOBIN (HGB A1C)      Result Value Ref Range   Hemoglobin A1C 6.2       Assessment and Plan: Hypogonadism male - Plan: PSA, Testosterone  Type II or unspecified type diabetes mellitus without mention of complication, not stated as uncontrolled - Plan: Lipid panel, POCT glycosylated hemoglobin (Hb A1C), Microalbumin, urine, HM DIABETES FOOT EXAM  Medication monitoring encounter - Plan: POCT CBC, Comprehensive metabolic panel, Lipid panel  Immunization due - Plan: Pneumococcal polysaccharide vaccine 23-valent greater than or equal to 2yo subcutaneous/IM, Tdap vaccine greater than or equal to 7yo IM  Diabetes mellitus, type 2 - Plan: metFORMIN (GLUCOPHAGE) 500 MG tablet  ED (erectile dysfunction) - Plan: tadalafil (CIALIS) 20 MG tablet  Dm is still under control. He is working on his diet again- got off course over the holidays Updated pneumovax and tdap as above, gave rx for zostavax Will check his labs as above today for ED and hypogonadism.  He may prefer to go back to testosterone shots; will be in touch with him pending his labs  See patient instructions for more details.    Encouraged an eye exam  Signed Lamar Blinks, MD  Prior auth for androgel: [phone]

## 2013-10-20 NOTE — Patient Instructions (Signed)
I will be in touch with your labs as soon as they come in.  We can decide what to do about your testosterone then.  Congratulations on your engagement!   Your A1c looks good today.   Now that you are 62 you are eligible to have the shingles vaccine.  You can have this done at most major drug stores.  You do want to wait one month after the pneumonia shot to have the shingles vaccine.   It is time to get your colonoscopy!  Look into [facility] or [facility]  As a diabetic, there are several things you can do to monitor your condition and maintain your health.  1. Check your feet daily for any skin breakdown 2. Exercise and keep track of your diet 3. Let us know before you run out of your medications 4. Get your annual flu shot, and ask if you need a pneumonia shot 5. Ask if you are up to date on your labs; you should have an A1c every 6 months, a urine protein test annually, and a cholesterol test annually.  Your doctor may decide to do labs more often if indicated 6. Take off your shoes and socks at each visit.  Be sure your doctor examines your feet.   7. Ask about your blood pressure.  Your goal is 130/ 80 or less 8. Get an annual eye exam.  Please ask your ophthalmologist to send [place] your report 9. Keep up with your dental cleanings and exams.

## 2013-10-21 ENCOUNTER — Encounter: Payer: Self-pay | Admitting: Family Medicine

## 2013-10-21 DIAGNOSIS — E291 Testicular hypofunction: Secondary | ICD-10-CM

## 2013-10-22 ENCOUNTER — Telehealth: Payer: Self-pay

## 2013-10-22 NOTE — Telephone Encounter (Signed)
error 
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
## 2013-10-22 NOTE — Telephone Encounter (Signed)
Lm for rtn call-  This is a normal reaction to the PN shot. Pt should not be concerned it will resolve in a few days. May take ibuprofen or tylenol for the pain. RTC if area does not resolve or worsens.

## 2013-10-23 ENCOUNTER — Telehealth: Payer: Self-pay | Admitting: Family Medicine

## 2013-10-23 MED ORDER — TESTOSTERONE CYPIONATE 200 MG/ML IM SOLN: INTRAMUSCULAR | Status: DC

## 2013-10-23 NOTE — Addendum Note (Signed)
Addended by: Lamar Blinks C on: 10/23/2013 02:55 PM   Modules accepted: Orders

## 2013-10-23 NOTE — Telephone Encounter (Signed)
See email. Pt is talking to Dr. Lorelei Pont about this subject.

## 2013-10-23 NOTE — Telephone Encounter (Signed)
Patient is calling to find out when he can come in for his testosterone injections. Would like to know if he can schedule an appointment to have this done every two weeks. Also, wants to know if he needs a script for the injection or will that already be available at the office.   [phone]

## 2013-10-24 NOTE — Telephone Encounter (Signed)
Pt notified and it the area is getting better.

## 2013-10-25 ENCOUNTER — Ambulatory Visit (INDEPENDENT_AMBULATORY_CARE_PROVIDER_SITE_OTHER): Payer: PRIVATE HEALTH INSURANCE | Admitting: Family Medicine

## 2013-10-25 ENCOUNTER — Encounter: Payer: Self-pay | Admitting: Family Medicine

## 2013-10-25 VITALS — BP 123/80 | HR 82

## 2013-10-25 DIAGNOSIS — E291 Testicular hypofunction: Secondary | ICD-10-CM

## 2013-10-25 MED ORDER — TESTOSTERONE CYPIONATE 200 MG/ML IM SOLN
200.0000 mg | INTRAMUSCULAR | Status: DC
  Administered 2013-10-25 – 2014-07-08 (×15): 200 mg via INTRAMUSCULAR

## 2013-10-25 MED ORDER — TESTOSTERONE CYPIONATE 200 MG/ML IM SOLN: INTRAMUSCULAR | Status: DC

## 2013-10-25 NOTE — Progress Notes (Signed)
Ok to have 200mg  of depo- testosterone today.  Repeat in 2 weeks.

## 2013-10-25 NOTE — Addendum Note (Signed)
Addended by: Lamar Blinks C on: 10/25/2013 09:48 PM   Modules accepted: Orders

## 2013-11-06 ENCOUNTER — Other Ambulatory Visit: Payer: Self-pay | Admitting: Family Medicine

## 2013-11-08 ENCOUNTER — Ambulatory Visit (INDEPENDENT_AMBULATORY_CARE_PROVIDER_SITE_OTHER): Payer: PRIVATE HEALTH INSURANCE | Admitting: Family Medicine

## 2013-11-08 DIAGNOSIS — E291 Testicular hypofunction: Secondary | ICD-10-CM

## 2013-11-11 MED ORDER — TESTOSTERONE CYPIONATE 200 MG/ML IM SOLN: 200.0000 mg | INTRAMUSCULAR | Status: DC

## 2013-11-22 ENCOUNTER — Ambulatory Visit (INDEPENDENT_AMBULATORY_CARE_PROVIDER_SITE_OTHER): Payer: PRIVATE HEALTH INSURANCE | Admitting: Radiology

## 2013-11-22 DIAGNOSIS — E291 Testicular hypofunction: Secondary | ICD-10-CM

## 2013-12-06 ENCOUNTER — Ambulatory Visit (INDEPENDENT_AMBULATORY_CARE_PROVIDER_SITE_OTHER): Payer: PRIVATE HEALTH INSURANCE | Admitting: *Deleted

## 2013-12-06 DIAGNOSIS — E291 Testicular hypofunction: Secondary | ICD-10-CM

## 2013-12-17 ENCOUNTER — Other Ambulatory Visit: Payer: Self-pay | Admitting: Family Medicine

## 2013-12-20 ENCOUNTER — Ambulatory Visit (INDEPENDENT_AMBULATORY_CARE_PROVIDER_SITE_OTHER): Payer: PRIVATE HEALTH INSURANCE | Admitting: Radiology

## 2013-12-20 DIAGNOSIS — E291 Testicular hypofunction: Secondary | ICD-10-CM

## 2013-12-24 ENCOUNTER — Other Ambulatory Visit: Payer: Self-pay | Admitting: Family Medicine

## 2013-12-28 ENCOUNTER — Other Ambulatory Visit: Payer: Self-pay | Admitting: Family Medicine

## 2014-01-04 ENCOUNTER — Ambulatory Visit (INDEPENDENT_AMBULATORY_CARE_PROVIDER_SITE_OTHER): Payer: PRIVATE HEALTH INSURANCE | Admitting: Family Medicine

## 2014-01-04 DIAGNOSIS — E291 Testicular hypofunction: Secondary | ICD-10-CM

## 2014-01-04 MED ORDER — TESTOSTERONE CYPIONATE 100 MG/ML IM SOLN
200.0000 mg | Freq: Once | INTRAMUSCULAR | Status: AC
  Administered 2014-01-04: 200 mg via INTRAMUSCULAR

## 2014-01-18 ENCOUNTER — Ambulatory Visit (INDEPENDENT_AMBULATORY_CARE_PROVIDER_SITE_OTHER): Payer: PRIVATE HEALTH INSURANCE | Admitting: Radiology

## 2014-01-18 DIAGNOSIS — E291 Testicular hypofunction: Secondary | ICD-10-CM

## 2014-01-31 ENCOUNTER — Ambulatory Visit (INDEPENDENT_AMBULATORY_CARE_PROVIDER_SITE_OTHER): Payer: PRIVATE HEALTH INSURANCE | Admitting: Radiology

## 2014-01-31 DIAGNOSIS — E291 Testicular hypofunction: Secondary | ICD-10-CM

## 2014-02-15 ENCOUNTER — Ambulatory Visit (INDEPENDENT_AMBULATORY_CARE_PROVIDER_SITE_OTHER): Payer: PRIVATE HEALTH INSURANCE | Admitting: *Deleted

## 2014-02-15 DIAGNOSIS — E291 Testicular hypofunction: Secondary | ICD-10-CM

## 2014-02-15 NOTE — Progress Notes (Signed)
   Subjective:    Patient ID: Randy Orr, male    DOB: 25-Nov-1950, 63 y.o.   MRN: [id number]  HPI  pt here for testerone injection only.   Review of Systems     Objective:   Physical Exam        Assessment & Plan:

## 2014-02-28 ENCOUNTER — Ambulatory Visit (INDEPENDENT_AMBULATORY_CARE_PROVIDER_SITE_OTHER): Payer: PRIVATE HEALTH INSURANCE | Admitting: Family Medicine

## 2014-02-28 DIAGNOSIS — E291 Testicular hypofunction: Secondary | ICD-10-CM

## 2014-02-28 MED ORDER — TESTOSTERONE CYPIONATE 100 MG/ML IM SOLN
200.0000 mg | Freq: Once | INTRAMUSCULAR | Status: AC
  Administered 2014-02-28: 200 mg via INTRAMUSCULAR

## 2014-02-28 NOTE — Progress Notes (Signed)
   Subjective:    Patient ID: Randy Orr, male    DOB: 01-14-1951, 63 y.o.   MRN: [id number]  HPI  Patient here for injection of testosterone per Dr. Lorelei Pont    Review of Systems     Objective:   Physical Exam        Assessment & Plan:

## 2014-03-14 ENCOUNTER — Encounter: Payer: Self-pay | Admitting: *Deleted

## 2014-03-14 ENCOUNTER — Ambulatory Visit (INDEPENDENT_AMBULATORY_CARE_PROVIDER_SITE_OTHER): Payer: PRIVATE HEALTH INSURANCE | Admitting: *Deleted

## 2014-03-14 VITALS — BP 132/80 | Temp 98.3°F

## 2014-03-14 DIAGNOSIS — E291 Testicular hypofunction: Secondary | ICD-10-CM

## 2014-03-14 MED ORDER — TESTOSTERONE CYPIONATE 200 MG/ML IM SOLN
200.0000 mg | Freq: Once | INTRAMUSCULAR | Status: AC
  Administered 2014-03-14: 200 mg via INTRAMUSCULAR

## 2014-03-14 NOTE — Progress Notes (Signed)
   Subjective:    Patient ID: Randy Orr, male    DOB: [birth year], 63 y.o.   MRN: [id number]  HPI  Patient here for testosterone injection only.    Review of Systems     Objective:   Physical Exam        Assessment & Plan:

## 2014-03-28 ENCOUNTER — Ambulatory Visit (INDEPENDENT_AMBULATORY_CARE_PROVIDER_SITE_OTHER): Payer: PRIVATE HEALTH INSURANCE | Admitting: *Deleted

## 2014-03-28 DIAGNOSIS — E291 Testicular hypofunction: Secondary | ICD-10-CM

## 2014-03-28 NOTE — Progress Notes (Signed)
   Subjective:    Patient ID: Randy Orr, male    DOB: 05/16/1951, 63 y.o.   MRN: [id number]  HPI patient here for Testosterone injection only.   Review of Systems     Objective:   Physical Exam        Assessment & Plan:

## 2014-04-11 ENCOUNTER — Ambulatory Visit (INDEPENDENT_AMBULATORY_CARE_PROVIDER_SITE_OTHER): Payer: PRIVATE HEALTH INSURANCE | Admitting: Family Medicine

## 2014-04-11 DIAGNOSIS — E291 Testicular hypofunction: Secondary | ICD-10-CM

## 2014-04-11 NOTE — Progress Notes (Signed)
   Subjective:    Patient ID: Randy Orr, male    DOB: October 03, 1950, 64 y.o.   MRN: [id number]  HPI Pt presents to office for testosterone inj only.   Review of Systems     Objective:   Physical Exam        Assessment & Plan:

## 2014-04-26 ENCOUNTER — Ambulatory Visit (INDEPENDENT_AMBULATORY_CARE_PROVIDER_SITE_OTHER): Payer: PRIVATE HEALTH INSURANCE | Admitting: Family Medicine

## 2014-04-26 DIAGNOSIS — E291 Testicular hypofunction: Secondary | ICD-10-CM

## 2014-05-09 ENCOUNTER — Ambulatory Visit (INDEPENDENT_AMBULATORY_CARE_PROVIDER_SITE_OTHER): Payer: PRIVATE HEALTH INSURANCE | Admitting: Radiology

## 2014-05-09 DIAGNOSIS — E291 Testicular hypofunction: Secondary | ICD-10-CM

## 2014-05-16 ENCOUNTER — Encounter: Payer: Self-pay | Admitting: Family Medicine

## 2014-05-16 ENCOUNTER — Ambulatory Visit (INDEPENDENT_AMBULATORY_CARE_PROVIDER_SITE_OTHER): Payer: PRIVATE HEALTH INSURANCE | Admitting: Family Medicine

## 2014-05-16 VITALS — BP 118/72 | HR 92 | Temp 98.2°F | Resp 16 | Ht 75.25 in | Wt 226.0 lb

## 2014-05-16 DIAGNOSIS — E1110 Type 2 diabetes mellitus with ketoacidosis without coma: Secondary | ICD-10-CM

## 2014-05-16 DIAGNOSIS — Z23 Encounter for immunization: Secondary | ICD-10-CM

## 2014-05-16 DIAGNOSIS — R200 Anesthesia of skin: Secondary | ICD-10-CM

## 2014-05-16 DIAGNOSIS — L309 Dermatitis, unspecified: Secondary | ICD-10-CM

## 2014-05-16 DIAGNOSIS — E291 Testicular hypofunction: Secondary | ICD-10-CM

## 2014-05-16 DIAGNOSIS — R208 Other disturbances of skin sensation: Secondary | ICD-10-CM

## 2014-05-16 DIAGNOSIS — E1310 Other specified diabetes mellitus with ketoacidosis without coma: Secondary | ICD-10-CM

## 2014-05-16 LAB — POCT GLYCOSYLATED HEMOGLOBIN (HGB A1C): Hemoglobin A1C: 6.2

## 2014-05-16 LAB — CBC
HCT: 45.9 % (ref 39.0–52.0)
Hemoglobin: 16.1 g/dL (ref 13.0–17.0)
MCH: 31.3 pg (ref 26.0–34.0)
MCHC: 35.1 g/dL (ref 30.0–36.0)
MCV: 89.1 fL (ref 78.0–100.0)
PLATELETS: 252 10*3/uL (ref 150–400)
RBC: 5.15 MIL/uL (ref 4.22–5.81)
RDW: 13.2 % (ref 11.5–15.5)
WBC: 5.6 10*3/uL (ref 4.0–10.5)

## 2014-05-16 LAB — BASIC METABOLIC PANEL
BUN: 17 mg/dL (ref 6–23)
CO2: 25 mEq/L (ref 19–32)
Calcium: 9.3 mg/dL (ref 8.4–10.5)
Chloride: 102 mEq/L (ref 96–112)
Creat: 1.15 mg/dL (ref 0.50–1.35)
Glucose, Bld: 109 mg/dL — ABNORMAL HIGH (ref 70–99)
Potassium: 4.3 mEq/L (ref 3.5–5.3)
Sodium: 136 mEq/L (ref 135–145)

## 2014-05-16 LAB — TESTOSTERONE: Testosterone: 721 ng/dL (ref 300–890)

## 2014-05-16 MED ORDER — TESTOSTERONE CYPIONATE 200 MG/ML IM SOLN: 200.0000 mg | INTRAMUSCULAR | Status: DC

## 2014-05-16 MED ORDER — TRIAMCINOLONE ACETONIDE 0.025 % EX CREA: TOPICAL_CREAM | Freq: Two times a day (BID) | CUTANEOUS | Status: DC

## 2014-05-16 NOTE — Patient Instructions (Addendum)
We will get you in to see neurology about the numbness in your feet.  They may want to do some nerve conduction studies  Please give [facility] or [facility] a call and schedule a screening colonoscopy.  You can look them up online and choose from there.    I will be in touch with the rest of your labs.    Please come and see me in about 6 months!   Results for orders placed in visit on 05/16/14  POCT GLYCOSYLATED HEMOGLOBIN (HGB A1C)      Result Value Ref Range   Hemoglobin A1C 6.2     Your A1c looks great!  Also great job on your weight loss and congratulations on your marriage!!

## 2014-05-16 NOTE — Progress Notes (Signed)
[facility] [address], [place] [address] [phone]  Date:  05/16/2014   Name:  Randy Orr   DOB:  Oct 02, 1950   MRN:  [id number]  PCP:  Lamar Blinks, MD    Chief Complaint: Diabetes and Hyperlipidemia   History of Present Illness:  Randy Orr is a 63 y.o. very pleasant male patient who presents with the following:  Here today to go over health maintenance. Last labs in March of this year.   He is now married and quite happy, he is working on a low carb diet along with his wife.  He would like to get to around 210 lbs.   His testosterone was last administered one week ago; he does 200 mg every 2 weeks  He still has numbness in his toes- he is trying some sore of infrared light treatment that his wife suggested. This does not seem to be helping.  He notes that his shoe size has gone up from 12 to 13. The numbness does not really bother him that much but he is concerned about what is causing it.  He would like to see a neurologist in hopes of making sure this is nothing to be more concerned about.    Wt Readings from Last 3 Encounters:  05/16/14 226 lb (102.513 kg)  10/20/13 235 lb 3.2 oz (106.686 kg)  03/10/13 226 lb (102.513 kg)    Lab Results  Component Value Date   HGBA1C 6.2 10/20/2013     Patient Active Problem List   Diagnosis Date Noted  . Diabetes mellitus 09/23/2011  . Hypogonadism male 09/23/2011  . Hyperlipidemia 09/23/2011    Past Medical History  Diagnosis Date  . Panic attacks   . Depression   . Low testosterone   . Diabetes mellitus without complication     No past surgical history on file.  History  Substance Use Topics  . Smoking status: Former Research scientist (life sciences)  . Smokeless tobacco: Never Used  . Alcohol Use: Yes    Family History  Problem Relation Age of Onset  . Diabetes Brother     No Known Allergies  Medication list has been reviewed and updated.  Current Outpatient Prescriptions on File Prior to Visit   Medication Sig Dispense Refill  . metFORMIN (GLUCOPHAGE) 500 MG tablet Take 1 tablet (500 mg total) by mouth 1 day or 1 dose.  90 tablet  2  . Multiple Vitamin (MULTIVITAMIN) tablet Take 1 tablet by mouth daily.      . tadalafil (CIALIS) 20 MG tablet Take 1/2 or 1 tablet as needed daily prior to sexual intercourse.  Max 20 mg in 24 hours.  4 tablet  11  . testosterone cypionate (DEPOTESTOTERONE CYPIONATE) 200 MG/ML injection Inject 1 mL (200 mg total) into the muscle every 14 (fourteen) days.  6 mL  1  . triamcinolone (KENALOG) 0.025 % cream Apply topically 2 (two) times daily.      Marland Kitchen ipratropium (ATROVENT) 0.03 % nasal spray Place 2 sprays into the nose every 12 (twelve) hours.  30 mL  0  . testosterone (ANDROGEL) 50 MG/5GM GEL Place 10 g onto the skin daily.  300 g  3  . valACYclovir (VALTREX) 1000 MG tablet Take 2 pills in 2 doses, 12 hours apart.  20 tablet  0   Current Facility-Administered Medications on File Prior to Visit  Medication Dose Route Frequency Provider Last Rate Last Dose  . testosterone cypionate (DEPOTESTOTERONE CYPIONATE) injection 200 mg  200  mg Intramuscular Q14 Days [name], MD   200 mg at 05/09/14 1223    Review of Systems:  As per HPI- otherwise negative.   Physical Examination: Filed Vitals:   05/16/14 0805  BP: 118/72  Pulse: 92  Temp: 98.2 F (36.8 C)  Resp: 16   Filed Vitals:   05/16/14 0805  Height: 6' 3.25" (1.911 m)  Weight: 226 lb (102.513 kg)   Body mass index is 28.07 kg/(m^2). Ideal Body Weight: Weight in (lb) to have BMI = 25: 200.9  GEN: WDWN, NAD, Non-toxic, A & O x 3, looks well today HEENT: Atraumatic, Normocephalic. Neck supple. No masses, No LAD. Ears and Nose: No external deformity. CV: RRR, No M/G/R. No JVD. No thrill. No extra heart sounds. PULM: CTA B, no wheezes, crackles, rhonchi. No retractions. No resp. distress. No accessory muscle use. EXTR: No c/c/e NEURO Normal gait.  PSYCH: Normally interactive.  Conversant. Not depressed or anxious appearing.  Calm demeanor.  Feet: both appear to be in excellent condition with normal pulses and cap refill. Healthy skin, no lesions.  Grossly no numbness is present  Results for orders placed in visit on 05/16/14  POCT GLYCOSYLATED HEMOGLOBIN (HGB A1C)      Result Value Ref Range   Hemoglobin A1C 6.2      Assessment and Plan: DM (diabetes mellitus) type 2, uncontrolled, with ketoacidosis - Plan: POCT glycosylated hemoglobin (Hb H6P), Basic metabolic panel  Immunization due - Plan: Flu Vaccine QUAD 36+ mos IM  Numbness in feet - Plan: Ambulatory referral to Neurology  Hypogonadism male - Plan: testosterone cypionate (DEPOTESTOTERONE CYPIONATE) 200 MG/ML injection, Testosterone, CBC  Eczema - Plan: triamcinolone (KENALOG) 0.025 % cream  He continues to have excellent DM control.  Other labs done in spring, await BMP Will refer to neurology to evaluate his feet Refilled his prn triamcinolone and his testosterone, await T level  Signed Lamar Blinks, MD

## 2014-05-17 ENCOUNTER — Encounter: Payer: Self-pay | Admitting: Neurology

## 2014-05-17 ENCOUNTER — Ambulatory Visit (INDEPENDENT_AMBULATORY_CARE_PROVIDER_SITE_OTHER): Payer: PRIVATE HEALTH INSURANCE | Admitting: Neurology

## 2014-05-17 ENCOUNTER — Encounter: Payer: Self-pay | Admitting: Internal Medicine

## 2014-05-17 VITALS — BP 155/90 | HR 72 | Resp 16 | Ht 75.5 in | Wt 228.0 lb

## 2014-05-17 DIAGNOSIS — E1142 Type 2 diabetes mellitus with diabetic polyneuropathy: Secondary | ICD-10-CM

## 2014-05-17 DIAGNOSIS — E1149 Type 2 diabetes mellitus with other diabetic neurological complication: Secondary | ICD-10-CM

## 2014-05-17 DIAGNOSIS — R0683 Snoring: Secondary | ICD-10-CM

## 2014-05-17 DIAGNOSIS — E669 Obesity, unspecified: Secondary | ICD-10-CM

## 2014-05-17 HISTORY — DX: Obesity, unspecified: E66.9

## 2014-05-17 HISTORY — DX: Type 2 diabetes mellitus with diabetic polyneuropathy: E11.42

## 2014-05-17 HISTORY — DX: Snoring: R06.83

## 2014-05-17 MED ORDER — L-METHYLFOLATE-B6-B12 3-35-2 MG PO TABS: 1.0000 | ORAL_TABLET | Freq: Every day | ORAL | Status: AC

## 2014-05-17 NOTE — Progress Notes (Signed)
S[facility]EP [facility]INE [facility]IC   Provider:  Larey Seat, M D  Referring Provider: [name], MD Primary Care Physician:  Randy Blinks, MD  Abnormal skin sensation.  HPI:  Randy Orr is a 63 y.o. caucasian, right handed male, who is seen here as a referral from Dr. Janett Billow  Orr for a neuropathy evaluation,  Randy Orr , a "underemployed " Art professor at [employer], was seen just yesterday by Dr. Edilia Bo for health maintenance visit. He recently got married and as working on maintaining a low carb diet. The patient also 10 pounds over the last 6 months and 30 pounds over the last 24 months, soon after being diagnosed with diabetes at weight 255 pounds.  He was briefly on a statin medication but he discontinued after experiencing achiness and soreness in the thigh muscles. A recent testosterone test revealed low numbers and he started with androgel supplement. He recovered energy and libido with the supplement, now using injectable testosterone.    Over the last 2 years he developed a full increase in foot size form size 12 to 13, felt as if paper scratched between his toes, a layer of cover decreasing his fine touch sensation. The heel feels occasional burning pain. When the pain stops , the numbness takes over . He noted, that he cannot feel the sheets covering his legs in bed. He has tenderness of the bunion area and normal circulation, no wounds, no cyanosis and no palor. He likely has a small fiber neuropathy. No edema and no circulatory problems. No dysautonomia.    Review of Systems: Out of a complete 14 system review, the patient complains of only the following symptoms, and all other reviewed systems are negative. sensory impairment. Numbness.  Snorer, retrognathia.   Epworth score 3 , Fatigue severity score not obtained  , depression score not obtained    History   Social History  . Marital Status: Divorced    Spouse Name: N/A    Number of Children: 1  . Years of  Education: AAS   Occupational History  . SECURITY OFFICER    Social History Main Topics  . Smoking status: Former Research scientist (life sciences)  . Smokeless tobacco: Never Used  . Alcohol Use: Yes  . Drug Use: No  . Sexual Activity: Not on file   Other Topics Concern  . Not on file   Social History Narrative  . No narrative on file    Family History  Problem Relation Age of Onset  . Diabetes Brother   . Heart attack Father     Past Medical History  Diagnosis Date  . Panic attacks   . Depression   . Low testosterone   . Diabetes mellitus without complication     Past Surgical History  Procedure Laterality Date  . None      Current Outpatient Prescriptions  Medication Sig Dispense Refill  . metFORMIN (GLUCOPHAGE) 500 MG tablet Take 1 tablet (500 mg total) by mouth 1 day or 1 dose.  90 tablet  2  . Multiple Vitamin (MULTIVITAMIN) tablet Take 1 tablet by mouth daily.      Marland Kitchen testosterone cypionate (DEPOTESTOTERONE CYPIONATE) 200 MG/ML injection Inject 1 mL (200 mg total) into the muscle every 14 (fourteen) days.  6 mL  4  . triamcinolone (KENALOG) 0.025 % cream Apply topically 2 (two) times daily.  60 g  2  . l-methylfolate-B6-B12 (METANX) 3-35-2 MG TABS Take 1 tablet by mouth daily.  60 tablet  1   Current Facility-Administered  Medications  Medication Dose Route Frequency Provider Last Rate Last Dose  . testosterone cypionate (DEPOTESTOTERONE CYPIONATE) injection 200 mg  200 mg Intramuscular Q14 Days Gay Filler Copland, MD   200 mg at 05/09/14 1223    Allergies as of 05/17/2014  . (No Known Allergies)    Vitals: BP 155/90  Pulse 72  Resp 16  Ht 6' 3.5" (1.918 m)  Wt 228 lb (103.42 kg)  BMI 28.11 kg/m2 Last Weight:  Wt Readings from Last 1 Encounters:  05/17/14 228 lb (103.42 kg)       Last Height:   Ht Readings from Last 1 Encounters:  05/17/14 6' 3.5" (1.918 m)    Physical exam:  General: The patient is awake, alert and appears not in acute distress. The patient is well  groomed. Head: Normocephalic, atraumatic. Neck is supple. Mallampati 2 ,  neck circumference: 17. Nasal airflow unrestricted  TMJ is  Evident, mild click  . Retrognathia is seen.  Cardiovascular:  Regular rate and rhythm , without  murmurs or carotid bruit, and without distended neck veins. Respiratory: Lungs are clear to auscultation. Skin:  Without evidence of edema, or rash Trunk: BMI is mildly  elevated and patient  has normal posture.  Neurologic exam : The patient is awake and alert, oriented to place and time.   Memory subjective described as intact. There is a normal attention span & concentration ability. Speech is fluent, eloquent, without  dysarthria, dysphonia or aphasia. Mood and affect are appropriate.  Cranial nerves: Pupils are equal and briskly reactive to light. Funduscopic exam without  evidence of pallor or edema.  Extraocular movements  in vertical and horizontal planes intact and without nystagmus. Visual fields by finger perimetry are intact. Hearing to finger rub intact.  Facial sensation intact to fine touch. Facial motor strength is symmetric and tongue and uvula move midline.  Motor exam:  Normal tone, muscle bulk and symmetric strength in all extremities.  Sensory:  Fine touch, pinprick and vibration were tested in all extremities.  Proprioception is normal.  Coordination: Rapid alternating movements in the fingers/hands is normal.  Finger-to-nose maneuver  normal without evidence of ataxia, dysmetria or tremor.  Gait and station: Patient walks without assistive device and is able unassisted to climb up to the exam table.  Strength within normal limits. Stance is stable and normal. Tandem gait is unfragmented. Romberg testing is negative.  Deep tendon reflexes: in the upper and lower extremities are symmetric and intact. Babinski maneuver downgoing.  Assessment:  After physical and neurologic examination, review of laboratory studies, imaging, neurophysiology  testing and pre-existing records, assessment is    1)Sensory , not motor,  neuropathy. diabetes related , small fiber, no dysautonomia.  2) Loss of the arch of the foot- likely the reason for his increase in  shoe size.  Weight will contribute.   No sign of acromegaly. 3) not fatigued- testosterone has helped, he is a snorer and has retrognathia. He sleeps well and sound.   The patient was advised of the nature of the diagnosed sleep disorder , the treatment options and risks for general a health and wellness arising from not treating the condition. Visit duration was 30 minutes.   Plan:  Treatment plan and additional workup :  I suggested a NCV , but the outcome would not influence the treatment. I Recommend Metanex. Vit B 12 and vit D , continue infra-red treatments.     Asencion Partridge Rafaela Dinius MD  05/17/2014

## 2014-05-17 NOTE — Patient Instructions (Signed)
Diabetic Neuropathy Diabetic neuropathy is a nerve disease or nerve damage that is caused by diabetes mellitus. About half of all people with diabetes mellitus have some form of nerve damage. Nerve damage is more common in those who have had diabetes mellitus for many years and who generally have not had good control of their blood sugar (glucose) level. Diabetic neuropathy is a common complication of diabetes mellitus. There are three more common types of diabetic neuropathy and a fourth type that is less common and less understood:   Peripheral neuropathy--This is the most common type of diabetic neuropathy. It causes damage to the nerves of the feet and legs first and then eventually the hands and arms.The damage affects the ability to sense touch.  Autonomic neuropathy--This type causes damage to the autonomic nervous system, which controls the following functions:  Heartbeat.  Body temperature.  Blood pressure.  Urination.  Digestion.  Sweating.  Sexual function.  Focal neuropathy--Focal neuropathy can be painful and unpredictable and occurs most often in older adults with diabetes mellitus. It involves a specific nerve or one area and often comes on suddenly. It usually does not cause long-term problems.  Radiculoplexus neuropathy-- Sometimes called lumbosacral radiculoplexus neuropathy, radiculoplexus neuropathy affects the nerves of the thighs, hips, buttocks, or legs. It is more common in people with type 2 diabetes mellitus and in older men. It is characterized by debilitating pain, weakness, and atrophy, usually in the thigh muscles. CAUSES  The cause of peripheral, autonomic, and focal neuropathies is diabetes mellitus that is uncontrolled and high glucose levels. The cause of radiculoplexus neuropathy is unknown. However, it is thought to be caused by inflammation related to uncontrolled glucose levels. SIGNS AND SYMPTOMS  Peripheral Neuropathy Peripheral neuropathy develops  slowly over time. When the nerves of the feet and legs no longer work there may be:   Burning, stabbing, or aching pain in the legs or feet.  Inability to feel pressure or pain in your feet. This can lead to:  Thick calluses over pressure areas.  Pressure sores.  Ulcers.  Foot deformities.  Reduced ability to feel temperature changes.  Muscle weakness. Autonomic Neuropathy The symptoms of autonomic neuropathy vary depending on which nerves are affected. Symptoms may include:  Problems with digestion, such as:  Feeling sick to your stomach (nausea).  Vomiting.  Bloating.  Constipation.  Diarrhea.  Abdominal pain.  Difficulty with urination. This occurs if you lose your ability to sense when your bladder is full. Problems include:  Urine leakage (incontinence).  Inability to empty your bladder completely (retention).  Rapid or irregular heartbeat (palpitations).  Blood pressure drops when you stand up (orthostatic hypotension). When you stand up you may feel:  Dizzy.  Weak.  Faint.  In men, inability to attain and maintain an erection.  In women, vaginal dryness and problems with decreased sexual desire and arousal.  Problems with body temperature regulation.  Increased or decreased sweating. Focal Neuropathy  Abnormal eye movements or abnormal alignment of both eyes.  Weakness in the wrist.  Foot drop. This results in an inability to lift the foot properly and abnormal walking or foot movement.  Paralysis on one side of your face (Bell palsy).  Chest or abdominal pain. Radiculoplexus Neuropathy  Sudden, severe pain in your hip, thigh, or buttocks.  Weakness and wasting of thigh muscles.  Difficulty rising from a seated position.  Abdominal swelling.  Unexplained weight loss (usually more than 10 lb [4.5 kg]). DIAGNOSIS  Peripheral Neuropathy Your senses may   be tested. Sensory function testing can be done with:  A light touch using a  monofilament.  A vibration with tuning fork.  A sharp sensation with a pin prick. Other tests that can help diagnose neuropathy are:  Nerve conduction velocity. This test checks the transmission of an electrical current through a nerve.  Electromyography. This shows how muscles respond to electrical signals transmitted by nearby nerves.  Quantitative sensory testing. This is used to assess how your nerves respond to vibrations and changes in temperature. Autonomic Neuropathy Diagnosis is often based on reported symptoms. Tell your health care provider if you experience:   Dizziness.   Constipation.   Diarrhea.   Inappropriate urination or inability to urinate.   Inability to get or maintain an erection.  Tests that may be done include:   Electrocardiography or Holter monitor. These are tests that can help show problems with the heart rate or heart rhythm.   An X-ray exam may be done. Focal Neuropathy Diagnosis is made based on your symptoms and what your health care provider finds during your exam. Other tests may be done. They may include:  Nerve conduction velocities. This checks the transmission of electrical current through a nerve.  Electromyography. This shows how muscles respond to electrical signals transmitted by nearby nerves.  Quantitative sensory testing. This test is used to assess how your nerves respond to vibration and changes in temperature. Radiculoplexus Neuropathy  Often the first thing is to eliminate any other issue or problems that might be the cause, as there is no stick test for diagnosis.  X-ray exam of your spine and lumbar region.  Spinal tap to rule out cancer.  MRI to rule out other lesions. TREATMENT  Once nerve damage occurs, it cannot be reversed. The goal of treatment is to keep the disease or nerve damage from getting worse and affecting more nerve fibers. Controlling your blood glucose level is the key. Most people with  radiculoplexus neuropathy see at least a partial improvement over time. You will need to keep your blood glucose and HbA1c levels in the target range determined by your health care provider. Things that help control blood glucose levels include:   Blood glucose monitoring.   Meal planning.   Physical activity.   Diabetes medicine.  Over time, maintaining lower blood glucose levels helps lessen symptoms. Sometimes, prescription pain medicine is needed. HOME CARE INSTRUCTIONS:  Do not smoke.  Keep your blood glucose level in the range that you and your health care provider have determined acceptable for you.  Keep your blood pressure level in the range that you and your health care provider have determined acceptable for you.  Eat a well-balanced diet.  Be active every day.  Check your feet every day. SEEK MEDICAL CARE IF:   You have burning, stabbing, or aching pain in the legs or feet.  You are unable to feel pressure or pain in your feet.  You develop problems with digestion such as:  Nausea.  Vomiting.  Bloating.  Constipation.  Diarrhea.  Abdominal pain.  You have difficulty with urination, such as:  Incontinence.  Retention.  You have palpitations.  You develop orthostatic hypotension. When you stand up you may feel:  Dizzy.  Weak.  Faint.  You cannot attain and maintain an erection (in men).  You have vaginal dryness and problems with decreased sexual desire and arousal (in women).  You have severe pain in your thighs, legs, or buttocks.  You have unexplained weight loss.   Document Released: 10/07/2001 Document Revised: 05/19/2013 Document Reviewed: 01/07/2013 [facility] Patient Information 2015 [place], [place]. This information is not intended to replace advice given to you by your health care provider. Make sure you discuss any questions you have with your health care provider. Diabetes and Foot Care Diabetes may cause you to have problems  because of poor blood supply (circulation) to your feet and legs. This may cause the skin on your feet to become thinner, break easier, and heal more slowly. Your skin may become dry, and the skin may peel and crack. You may also have nerve damage in your legs and feet causing decreased feeling in them. You may not notice minor injuries to your feet that could lead to infections or more serious problems. Taking care of your feet is one of the most important things you can do for yourself.  HOME CARE INSTRUCTIONS  Wear shoes at all times, even in the house. Do not go barefoot. Bare feet are easily injured.  Check your feet daily for blisters, cuts, and redness. If you cannot see the bottom of your feet, use a mirror or ask someone for help.  Wash your feet with warm water (do not use hot water) and mild soap. Then pat your feet and the areas between your toes until they are completely dry. Do not soak your feet as this can dry your skin.  Apply a moisturizing lotion or petroleum jelly (that does not contain alcohol and is unscented) to the skin on your feet and to dry, brittle toenails. Do not apply lotion between your toes.  Trim your toenails straight across. Do not dig under them or around the cuticle. File the edges of your nails with an emery board or nail file.  Do not cut corns or calluses or try to remove them with medicine.  Wear clean socks or stockings every day. Make sure they are not too tight. Do not wear knee-high stockings since they may decrease blood flow to your legs.  Wear shoes that fit properly and have enough cushioning. To break in new shoes, wear them for just a few hours a day. This prevents you from injuring your feet. Always look in your shoes before you put them on to be sure there are no objects inside.  Do not cross your legs. This may decrease the blood flow to your feet.  If you find a minor scrape, cut, or break in the skin on your feet, keep it and the skin  around it clean and dry. These areas may be cleansed with mild soap and water. Do not cleanse the area with peroxide, alcohol, or iodine.  When you remove an adhesive bandage, be sure not to damage the skin around it.  If you have a wound, look at it several times a day to make sure it is healing.  Do not use heating pads or hot water bottles. They may burn your skin. If you have lost feeling in your feet or legs, you may not know it is happening until it is too late.  Make sure your health care provider performs a complete foot exam at least annually or more often if you have foot problems. Report any cuts, sores, or bruises to your health care provider immediately. SEEK MEDICAL CARE IF:   You have an injury that is not healing.  You have cuts or breaks in the skin.  You have an ingrown nail.  You notice redness on your legs or feet.  You feel burning or tingling in your legs or feet.  You have pain or cramps in your legs and feet.  Your legs or feet are numb.  Your feet always feel cold. SEEK IMMEDIATE MEDICAL CARE IF:   There is increasing redness, swelling, or pain in or around a wound.  There is a red line that goes up your leg.  Pus is coming from a wound.  You develop a fever or as directed by your health care provider.  You notice a bad smell coming from an ulcer or wound. Document Released: 07/26/2000 Document Revised: 03/31/2013 Document Reviewed: 01/05/2013 [facility] Patient Information 2015 [place], [place]. This information is not intended to replace advice given to you by your health care provider. Make sure you discuss any questions you have with your health care provider.

## 2014-05-22 ENCOUNTER — Other Ambulatory Visit: Payer: Self-pay | Admitting: Family Medicine

## 2014-05-22 DIAGNOSIS — E291 Testicular hypofunction: Secondary | ICD-10-CM

## 2014-05-22 DIAGNOSIS — E668 Other obesity: Secondary | ICD-10-CM

## 2014-05-26 ENCOUNTER — Ambulatory Visit (INDEPENDENT_AMBULATORY_CARE_PROVIDER_SITE_OTHER): Payer: PRIVATE HEALTH INSURANCE | Admitting: *Deleted

## 2014-05-26 DIAGNOSIS — E291 Testicular hypofunction: Secondary | ICD-10-CM

## 2014-05-26 NOTE — Progress Notes (Signed)
   Subjective:    Patient ID: Randy Orr, male    DOB: Jun 29, 1951, 63 y.o.   MRN: [id number]  HPI    Review of Systems     Objective:   Physical Exam        Assessment & Plan:  Patient here for testosterone cypionate injection only

## 2014-06-09 ENCOUNTER — Ambulatory Visit (INDEPENDENT_AMBULATORY_CARE_PROVIDER_SITE_OTHER): Payer: PRIVATE HEALTH INSURANCE | Admitting: *Deleted

## 2014-06-09 DIAGNOSIS — E291 Testicular hypofunction: Secondary | ICD-10-CM

## 2014-06-09 NOTE — Progress Notes (Signed)
   Subjective:    Patient ID: Randy Orr, male    DOB: 1951/05/30, 64 y.o.   MRN: [id number]  HPI    Review of Systems     Objective:   Physical Exam        Assessment & Plan:  Patient here for testosterone injection only.

## 2014-06-24 ENCOUNTER — Ambulatory Visit (INDEPENDENT_AMBULATORY_CARE_PROVIDER_SITE_OTHER): Payer: PRIVATE HEALTH INSURANCE

## 2014-06-24 DIAGNOSIS — E291 Testicular hypofunction: Secondary | ICD-10-CM

## 2014-06-24 NOTE — Progress Notes (Signed)
   Subjective:    Patient ID: Randy Orr, male    DOB: 02/06/1951, 63 y.o.   MRN: [id number]  HPI  Patient here for injection only.  Review of Systems     Objective:   Physical Exam        Assessment & Plan:

## 2014-07-04 ENCOUNTER — Ambulatory Visit (AMBULATORY_SURGERY_CENTER): Payer: Self-pay | Admitting: *Deleted

## 2014-07-04 VITALS — Ht 75.0 in | Wt 235.0 lb

## 2014-07-04 DIAGNOSIS — Z1211 Encounter for screening for malignant neoplasm of colon: Secondary | ICD-10-CM

## 2014-07-04 MED ORDER — MOVIPREP 100 G PO SOLR: ORAL | Status: DC

## 2014-07-04 NOTE — Progress Notes (Signed)
Patient denies any allergies to eggs or soy. Patient denies any past surgeries. Patient denies any oxygen use at home and does not take any diet/weight loss medications. EMMI education assisgned to patient on colonoscopy, this was explained and instructions given to patient. 

## 2014-07-05 ENCOUNTER — Encounter: Payer: Self-pay | Admitting: Family Medicine

## 2014-07-05 DIAGNOSIS — E291 Testicular hypofunction: Secondary | ICD-10-CM

## 2014-07-08 ENCOUNTER — Ambulatory Visit (INDEPENDENT_AMBULATORY_CARE_PROVIDER_SITE_OTHER): Payer: PRIVATE HEALTH INSURANCE | Admitting: Radiology

## 2014-07-08 VITALS — BP 118/76 | HR 72

## 2014-07-08 DIAGNOSIS — E291 Testicular hypofunction: Secondary | ICD-10-CM

## 2014-07-11 NOTE — Progress Notes (Signed)
Hypogonadism in male injection only.

## 2014-07-17 MED ORDER — TESTOSTERONE CYPIONATE 200 MG/ML IM SOLN: INTRAMUSCULAR | Status: DC

## 2014-07-17 NOTE — Addendum Note (Signed)
Addended by: Lamar Blinks C on: 07/17/2014 06:08 PM   Modules accepted: Orders

## 2014-07-18 ENCOUNTER — Encounter: Payer: Self-pay | Admitting: Internal Medicine

## 2014-07-18 ENCOUNTER — Ambulatory Visit (AMBULATORY_SURGERY_CENTER): Payer: No Typology Code available for payment source | Admitting: Internal Medicine

## 2014-07-18 VITALS — BP 125/82 | HR 69 | Temp 97.5°F | Resp 11 | Ht 75.0 in | Wt 235.0 lb

## 2014-07-18 DIAGNOSIS — D124 Benign neoplasm of descending colon: Secondary | ICD-10-CM

## 2014-07-18 DIAGNOSIS — D122 Benign neoplasm of ascending colon: Secondary | ICD-10-CM

## 2014-07-18 DIAGNOSIS — D128 Benign neoplasm of rectum: Secondary | ICD-10-CM

## 2014-07-18 DIAGNOSIS — Z1211 Encounter for screening for malignant neoplasm of colon: Secondary | ICD-10-CM

## 2014-07-18 HISTORY — PX: COLONOSCOPY: SHX174

## 2014-07-18 MED ORDER — SODIUM CHLORIDE 0.9 % IV SOLN: 500.0000 mL | INTRAVENOUS | Status: DC

## 2014-07-18 NOTE — Progress Notes (Signed)
Called to room to assist during endoscopic procedure.  Patient ID and intended procedure confirmed with present staff. Received instructions for my participation in the procedure from the performing physician.  

## 2014-07-18 NOTE — Progress Notes (Signed)
Report to PACU, RN, vss, BBS= Clear.  

## 2014-07-18 NOTE — Patient Instructions (Signed)
Discharge instructions given. Handouts on polyps. Resume previous medications. YOU HAD AN ENDOSCOPIC PROCEDURE TODAY AT THE [facility]: Refer to the procedure report that was given to you for any specific questions about what was found during the examination.  If the procedure report does not answer your questions, please call your gastroenterologist to clarify.  If you requested that your care partner not be given the details of your procedure findings, then the procedure report has been included in a sealed envelope for you to review at your convenience later.  YOU SHOULD EXPECT: Some feelings of bloating in the abdomen. Passage of more gas than usual.  Walking can help get rid of the air that was put into your GI tract during the procedure and reduce the bloating. If you had a lower endoscopy (such as a colonoscopy or flexible sigmoidoscopy) you may notice spotting of blood in your stool or on the toilet paper. If you underwent a bowel prep for your procedure, then you may not have a normal bowel movement for a few days.  DIET: Your first meal following the procedure should be a light meal and then it is ok to progress to your normal diet.  A half-sandwich or bowl of soup is an example of a good first meal.  Heavy or fried foods are harder to digest and may make you feel nauseous or bloated.  Likewise meals heavy in dairy and vegetables can cause extra gas to form and this can also increase the bloating.  Drink plenty of fluids but you should avoid alcoholic beverages for 24 hours.  ACTIVITY: Your care partner should take you home directly after the procedure.  You should plan to take it easy, moving slowly for the rest of the day.  You can resume normal activity the day after the procedure however you should NOT DRIVE or use heavy machinery for 24 hours (because of the sedation medicines used during the test).    SYMPTOMS TO REPORT IMMEDIATELY: A gastroenterologist can be reached at any  hour.  During normal business hours, 8:30 AM to 5:00 PM Monday through Friday, call [phone].  After hours and on weekends, please call the GI answering service at [phone] who will take a message and have the physician on call contact you.   Following lower endoscopy (colonoscopy or flexible sigmoidoscopy):  Excessive amounts of blood in the stool  Significant tenderness or worsening of abdominal pains  Swelling of the abdomen that is new, acute  Fever of 100F or higher  FOLLOW UP: If any biopsies were taken you will be contacted by phone or by letter within the next 1-3 weeks.  Call your gastroenterologist if you have not heard about the biopsies in 3 weeks.  Our staff will call the home number listed on your records the next business day following your procedure to check on you and address any questions or concerns that you may have at that time regarding the information given to you following your procedure. This is a courtesy call and so if there is no answer at the home number and we have not heard from you through the emergency physician on call, we will assume that you have returned to your regular daily activities without incident.  SIGNATURES/CONFIDENTIALITY: You and/or your care partner have signed paperwork which will be entered into your electronic medical record.  These signatures attest to the fact that that the information above on your After Visit Summary has been reviewed and is   understood.  Full responsibility of the confidentiality of this discharge information lies with you and/or your care-partner. 

## 2014-07-19 ENCOUNTER — Telehealth: Payer: Self-pay

## 2014-07-19 NOTE — Telephone Encounter (Signed)
  Follow up Call-  Call back number 07/18/2014  Post procedure Call Back phone  # [phone] cell  Permission to leave phone message Yes     Patient questions:  Do you have a fever, pain , or abdominal swelling? No. Pain Score  0 *  Have you tolerated food without any problems? Yes.    Have you been able to return to your normal activities? Yes.    Do you have any questions about your discharge instructions: Diet   No. Medications  No. Follow up visit  No.  Do you have questions or concerns about your Care? No.  Actions: * If pain score is 4 or above: No action needed, pain <4.

## 2014-07-20 NOTE — Op Note (Signed)
[facility]  [employer] [place], [id number]   COLONOSCOPY PROCEDURE REPORT  PATIENT: Randy Orr, Randy Orr  MR#: [id number] BIRTHDATE: 1951-06-29 , 63  yrs. old GENDER: male ENDOSCOPIST: Jerene Bears, MD REFERRED BS:[id number] Copland, M.D. PROCEDURE DATE:  07/18/2014 PROCEDURE:   Colonoscopy with snare polypectomy First Screening Colonoscopy - Avg.  risk and is 50 yrs.  old or older Yes.  Prior Negative Screening - Now for repeat screening. N/A  History of Adenoma - Now for follow-up colonoscopy & has been > or = to 3 yrs.  N/A  Polyps Removed Today? Yes. ASA CLASS:   Class II INDICATIONS:average risk for colorectal cancer and first colonoscopy. MEDICATIONS: Monitored anesthesia care and Propofol 200 mg IV  DESCRIPTION OF PROCEDURE:   After the risks benefits and alternatives of the procedure were thoroughly explained, informed consent was obtained.  The digital rectal exam revealed no rectal mass.   The LB [id number]  endoscope was introduced through the anus and advanced to the cecum, which was identified by both the appendix and ileocecal valve. No adverse events experienced. The quality of the prep was good, using MoviPrep  The instrument was then slowly withdrawn as the colon was fully examined.  COLON FINDINGS: Three sessile polyps ranging from 3 to 67mm in size were found in the descending colon, ascending colon, and rectum. Polypectomies were performed with a cold snare.  The resection was complete, the polyp tissue was partially retrieved (diminutive rectal polyp not retrieved) and sent to histology.   The examination was otherwise normal.  Retroflexed views revealed no abnormalities. The time to cecum=3 minutes 21 seconds.  Withdrawal time=11 minutes 42 seconds.  The scope was withdrawn and the procedure completed. COMPLICATIONS: There were no immediate complications.  ENDOSCOPIC IMPRESSION: 1.   Three sessile polyps ranging from 3 to 62mm in size  were found in the descending colon, ascending colon, and rectum; polypectomies were performed with a cold snare 2.   The examination was otherwise normal  RECOMMENDATIONS: 1.  Await pathology results 2.  If the polyps removed today are proven to be adenomatous (pre-cancerous) polyps, you will need a repeat colonoscopy in 5 years.  Otherwise you should continue to follow colorectal cancer screening guidelines for "routine risk" patients with colonoscopy in 10 years.  You will receive a letter within 1-2 weeks with the results of your biopsy as well as final recommendations.  Please call my office if you have not received a letter after 3 weeks.  eSigned:  Jerene Bears, MD 07/18/2014 9:21 AM   cc: Lamar Blinks, MD and The Patient

## 2014-07-22 ENCOUNTER — Ambulatory Visit (INDEPENDENT_AMBULATORY_CARE_PROVIDER_SITE_OTHER): Payer: PRIVATE HEALTH INSURANCE | Admitting: *Deleted

## 2014-07-22 ENCOUNTER — Encounter: Payer: Self-pay | Admitting: Internal Medicine

## 2014-07-22 DIAGNOSIS — E291 Testicular hypofunction: Secondary | ICD-10-CM

## 2014-07-22 MED ORDER — TESTOSTERONE CYPIONATE 200 MG/ML IM SOLN
200.0000 mg | INTRAMUSCULAR | Status: AC
  Administered 2014-07-22 – 2014-08-08 (×2): 200 mg via INTRAMUSCULAR

## 2014-07-22 NOTE — Progress Notes (Signed)
   Subjective:    Patient ID: Randy Orr, male    DOB: 05/29/1951, 63 y.o.   MRN: [id number]  HPI Patient is here for testosterone injection only.    Review of Systems     Objective:   Physical Exam        Assessment & Plan:   

## 2014-08-08 ENCOUNTER — Ambulatory Visit (INDEPENDENT_AMBULATORY_CARE_PROVIDER_SITE_OTHER): Payer: PRIVATE HEALTH INSURANCE | Admitting: *Deleted

## 2014-08-08 DIAGNOSIS — E291 Testicular hypofunction: Secondary | ICD-10-CM

## 2014-08-08 NOTE — Progress Notes (Signed)
   Subjective:    Patient ID: Randy Orr, male    DOB: May 09, 1951, 63 y.o.   MRN: [id number]  HPI Patient is here for testosterone injection only.    Review of Systems     Objective:   Physical Exam        Assessment & Plan:

## 2014-10-27 ENCOUNTER — Other Ambulatory Visit: Payer: Self-pay | Admitting: Family Medicine

## 2014-12-03 ENCOUNTER — Other Ambulatory Visit: Payer: Self-pay | Admitting: Family Medicine

## 2015-01-25 ENCOUNTER — Encounter: Payer: Self-pay | Admitting: Family Medicine

## 2015-01-25 ENCOUNTER — Other Ambulatory Visit: Payer: Self-pay | Admitting: Physician Assistant

## 2015-01-26 MED ORDER — METFORMIN HCL 500 MG PO TABS: ORAL_TABLET | ORAL | Status: DC

## 2015-03-01 ENCOUNTER — Ambulatory Visit (INDEPENDENT_AMBULATORY_CARE_PROVIDER_SITE_OTHER): Payer: Self-pay | Admitting: Physician Assistant

## 2015-03-01 VITALS — BP 110/70 | HR 75 | Temp 98.7°F | Resp 16 | Ht 75.0 in | Wt 219.0 lb

## 2015-03-01 DIAGNOSIS — E291 Testicular hypofunction: Secondary | ICD-10-CM

## 2015-03-01 DIAGNOSIS — E118 Type 2 diabetes mellitus with unspecified complications: Secondary | ICD-10-CM

## 2015-03-01 LAB — POCT CBC
Granulocyte percent: 71.6 %G (ref 37–80)
HCT, POC: 48.2 % (ref 43.5–53.7)
Hemoglobin: 16.3 g/dL (ref 14.1–18.1)
Lymph, poc: 1.8 (ref 0.6–3.4)
MCH, POC: 30.7 pg (ref 27–31.2)
MCHC: 33.9 g/dL (ref 31.8–35.4)
MCV: 90.7 fL (ref 80–97)
MID (CBC): 0.2 (ref 0–0.9)
MPV: 7.1 fL (ref 0–99.8)
POC GRANULOCYTE: 5 (ref 2–6.9)
POC LYMPH %: 25 % (ref 10–50)
POC MID %: 3.4 %M (ref 0–12)
Platelet Count, POC: 254 10*3/uL (ref 142–424)
RBC: 5.31 M/uL (ref 4.69–6.13)
RDW, POC: 13 %
WBC: 7 10*3/uL (ref 4.6–10.2)

## 2015-03-01 LAB — LIPID PANEL
Cholesterol: 239 mg/dL — ABNORMAL HIGH (ref 0–200)
HDL: 53 mg/dL (ref >=40)
LDL Cholesterol: 163 mg/dL — ABNORMAL HIGH (ref 0–99)
Total CHOL/HDL Ratio: 4.5 Ratio
Triglycerides: 114 mg/dL (ref <150)
VLDL: 23 mg/dL (ref 0–40)

## 2015-03-01 LAB — POCT GLYCOSYLATED HEMOGLOBIN (HGB A1C): Hemoglobin A1C: 6.3

## 2015-03-01 LAB — COMPLETE METABOLIC PANEL WITH GFR
ALT: 27 U/L (ref 0–53)
AST: 20 U/L (ref 0–37)
Albumin: 4.4 g/dL (ref 3.5–5.2)
Alkaline Phosphatase: 46 U/L (ref 39–117)
BUN: 16 mg/dL (ref 6–23)
CALCIUM: 9.6 mg/dL (ref 8.4–10.5)
CHLORIDE: 102 meq/L (ref 96–112)
CO2: 27 meq/L (ref 19–32)
CREATININE: 1.12 mg/dL (ref 0.50–1.35)
GFR, Est African American: 82 mL/min
GFR, Est Non African American: 71 mL/min
GLUCOSE: 137 mg/dL — AB (ref 70–99)
Potassium: 4.9 mEq/L (ref 3.5–5.3)
Sodium: 138 mEq/L (ref 135–145)
Total Bilirubin: 0.6 mg/dL (ref 0.2–1.2)
Total Protein: 7 g/dL (ref 6.0–8.3)

## 2015-03-01 LAB — TESTOSTERONE: Testosterone: 274 ng/dL — ABNORMAL LOW (ref 300–890)

## 2015-03-01 LAB — GLUCOSE, POCT (MANUAL RESULT ENTRY): POC Glucose: 119 mg/dl — AB (ref 70–99)

## 2015-03-01 MED ORDER — METFORMIN HCL 500 MG PO TABS: ORAL_TABLET | ORAL | Status: DC

## 2015-03-01 MED ORDER — TESTOSTERONE CYPIONATE 200 MG/ML IM SOLN
INTRAMUSCULAR | Status: DC
Start: 2015-03-01 — End: 2016-02-21

## 2015-03-01 NOTE — Progress Notes (Signed)
[facility] [address] [place] [address] [phone]  Date:  03/01/2015   Name:  Randy Orr   DOB:  1951/07/03   MRN:  [id number]  PCP:  Lamar Blinks, MD    History of Present Illness:  Randy Orr is a 64 y.o. male patient who presents to [facility] for refill of metformin and his testosterone.  He reports that he is doing well.  He denies any fatigue, chest pains, palpitations, dizziness, or lower extremity swelling. He does not monitor his fasting blood sugar at home.  He takes 1 metformin daily.  He denies any frequency, nausea, vomiting, diarrhea, or polyuria.  He continues to have that 1st right toe neuropathy that was followed by a neurologist.  He reports no increase in severity.  He currently is not exercising at this time, but will be moving in closer proximity to his gym.  He has a new home, busy with renovations, and he should be able to start a regimen in the next 2 weeks.   He has been having a restricted diet, of low calorie intake during the days, with lean meats, shakes, broiled fish, and spinach salads.  He has witnessed 15lb weight loss due to this change.   He gives his testosterone injections by wife bi-weekly, which has worked out well.  He also has concern of knee pain that has gone on for months.  It is a nagging pain that occurs more at night when he lays down at his left medial anterior knee.  He has tried ice to no avail.   Patient Active Problem List   Diagnosis Date Noted  . Peripheral sensory neuropathy due to type 2 diabetes mellitus 05/17/2014  . Snoring 05/17/2014  . Obesity 05/17/2014  . Diabetes mellitus 09/23/2011  . Hypogonadism male 09/23/2011  . Hyperlipidemia 09/23/2011    Past Medical History  Diagnosis Date  . Panic attacks   . Depression   . Low testosterone   . Peripheral sensory neuropathy due to type 2 diabetes mellitus 05/17/2014  . Snoring 05/17/2014  . Obesity 05/17/2014  . Diabetes mellitus without  complication     DM type 2    Past Surgical History  Procedure Laterality Date  . None      History  Substance Use Topics  . Smoking status: Former Research scientist (life sciences)  . Smokeless tobacco: Never Used     Comment: smoked 30 years ago.  . Alcohol Use: 3.0 oz/week    5 Glasses of wine per week    Family History  Problem Relation Age of Onset  . Diabetes Brother   . Heart attack Father   . Colon cancer Neg Hx   . Esophageal cancer Neg Hx   . Rectal cancer Neg Hx   . Stomach cancer Neg Hx     No Known Allergies  Medication list has been reviewed and updated.  Current Outpatient Prescriptions on File Prior to Visit  Medication Sig Dispense Refill  . l-methylfolate-B6-B12 (METANX) 3-35-2 MG TABS Take 1 tablet by mouth daily. 60 tablet 1  . metFORMIN (GLUCOPHAGE) 500 MG tablet TAKE 1 TABLET (500 MG TOTAL) BY MOUTH DAILY.  "OV NEEDED FOR ADDITIONAL REFILLS" -2ND 30 tablet 0  . Multiple Vitamin (MULTIVITAMIN) tablet Take 1 tablet by mouth daily.    Marland Kitchen testosterone cypionate (DEPOTESTOTERONE CYPIONATE) 200 MG/ML injection INJECT 1 ML INTO THE MUSCLE EVERY 14 DAYS. 10 mL 5  . triamcinolone (KENALOG) 0.025 % cream Apply topically 2 (two) times  daily. 60 g 2  . Coenzyme Q10 (CO Q 10 PO) Take 1 tablet by mouth daily.     No current facility-administered medications on file prior to visit.    ROS ROS otherwise unremarkable unless listed above.   Physical Examination: BP 110/70 mmHg  Pulse 75  Temp(Src) 98.7 F (37.1 C) (Oral)  Resp 16  Ht 6\' 3"  (1.905 m)  Wt 219 lb (99.338 kg)  BMI 27.37 kg/m2  SpO2 98% Ideal Body Weight: Weight in (lb) to have BMI = 25: 199.6 Wt Readings from Last 3 Encounters:  03/01/15 219 lb (99.338 kg)  07/18/14 235 lb (106.595 kg)  07/04/14 235 lb (106.595 kg)     Physical Exam Alert cooperative and oriented 4. PERRLA with normal conjunctiva. No thyromegaly to be noted. Heart sounds are particular rate and rhythm without murmurs, rubs, or gallops. Breath  sounds are normal without wheezing or rhonchi no lower extremity swelling DP pulses are 2+ as well as radial.  Left knee without crepitus.  Normal ROM and strength.  Normal DP pulses.  Monofilament 4/5 of right foot (1st toe), and 5/5 of the left foot.  Normal capillary refill.    Results for orders placed or performed in visit on 03/01/15  POCT glucose (manual entry)  Result Value Ref Range   POC Glucose 119 (A) 70 - 99 mg/dl  POCT CBC  Result Value Ref Range   WBC 7.0 4.6 - 10.2 K/uL   Lymph, poc 1.8 0.6 - 3.4   POC LYMPH PERCENT 25.0 10 - 50 %L   MID (cbc) 0.2 0 - 0.9   POC MID % 3.4 0 - 12 %M   POC Granulocyte 5.0 2 - 6.9   Granulocyte percent 71.6 37 - 80 %G   RBC 5.31 4.69 - 6.13 M/uL   Hemoglobin 16.3 14.1 - 18.1 g/dL   HCT, POC 48.2 43.5 - 53.7 %   MCV 90.7 80 - 97 fL   MCH, POC 30.7 27 - 31.2 pg   MCHC 33.9 31.8 - 35.4 g/dL   RDW, POC 13.0 %   Platelet Count, POC 254.0 142 - 424 K/uL   MPV 7.1 0 - 99.8 fL   Results for orders placed or performed in visit on 03/01/15  POCT glycosylated hemoglobin (Hb A1C)  Result Value Ref Range   Hemoglobin A1C 6.3   POCT glucose (manual entry)  Result Value Ref Range   POC Glucose 119 (A) 70 - 99 mg/dl  POCT CBC  Result Value Ref Range   WBC 7.0 4.6 - 10.2 K/uL   Lymph, poc 1.8 0.6 - 3.4   POC LYMPH PERCENT 25.0 10 - 50 %L   MID (cbc) 0.2 0 - 0.9   POC MID % 3.4 0 - 12 %M   POC Granulocyte 5.0 2 - 6.9   Granulocyte percent 71.6 37 - 80 %G   RBC 5.31 4.69 - 6.13 M/uL   Hemoglobin 16.3 14.1 - 18.1 g/dL   HCT, POC 48.2 43.5 - 53.7 %   MCV 90.7 80 - 97 fL   MCH, POC 30.7 27 - 31.2 pg   MCHC 33.9 31.8 - 35.4 g/dL   RDW, POC 13.0 %   Platelet Count, POC 254.0 142 - 424 K/uL   MPV 7.1 0 - 99.8 fL   Assessment and Plan: 64 year old male is here today for refill of his metformin and his testosterone injections.  We have discussed including his exercise regimen asap.  I have advised him to ice and bandage the knee at this time.   Advised that he return in 6 months for followup of his testosterone and diabetes.  Encouraged his weight loss and exercise.   Hypogonadism in male - Plan: Lipid panel, COMPLETE METABOLIC PANEL WITH GFR, Testosterone, CANCELED: CBC  Type 2 diabetes mellitus with complication - Plan: POCT glycosylated hemoglobin (Hb A1C), POCT glucose (manual entry), Lipid panel, COMPLETE METABOLIC PANEL WITH GFR, metFORMIN (GLUCOPHAGE) 500 MG tablet  Hypogonadism male - Plan: testosterone cypionate (DEPOTESTOSTERONE CYPIONATE) 200 MG/ML injection, POCT CBC  Ivar Drape, PA-C [facility] 03/01/2015 8:24 AM

## 2015-03-01 NOTE — Patient Instructions (Addendum)
Continue to take your metformin and testosterone as instructed.  I will have your lab results within the next 7 days.  I would like you to return in the next 6 months for recheck.  Please be intentional about restarting your exercise regimen.  This is paramount to your health.  Your a1c went up by .1.  This can be difficult with moving for EVERYONE, because things just aren't accessbile.  BUT your health should come first.  I have added below, some dietary precautions to include in your regime.  All in all, doing very well.  See you in 6 months.

## 2015-04-21 ENCOUNTER — Other Ambulatory Visit: Payer: Self-pay | Admitting: Family Medicine

## 2015-09-01 ENCOUNTER — Encounter: Payer: Self-pay | Admitting: Family Medicine

## 2015-09-06 ENCOUNTER — Encounter: Payer: Self-pay | Admitting: Family Medicine

## 2015-09-21 ENCOUNTER — Other Ambulatory Visit: Payer: Self-pay | Admitting: Physician Assistant

## 2015-09-22 ENCOUNTER — Other Ambulatory Visit: Payer: Self-pay

## 2015-09-22 MED ORDER — METFORMIN HCL 500 MG PO TABS: ORAL_TABLET | ORAL | Status: DC

## 2015-10-26 ENCOUNTER — Other Ambulatory Visit: Payer: Self-pay

## 2015-10-26 MED ORDER — METFORMIN HCL 500 MG PO TABS: ORAL_TABLET | ORAL | Status: DC

## 2015-11-13 ENCOUNTER — Telehealth: Payer: Self-pay

## 2015-11-13 NOTE — Telephone Encounter (Signed)
Pharm reqs RF of metformin. I called and LMOM that pt is overdue for f/up and asked for RTC or CB w/plans.

## 2015-11-23 ENCOUNTER — Ambulatory Visit (INDEPENDENT_AMBULATORY_CARE_PROVIDER_SITE_OTHER): Payer: Self-pay | Admitting: Family Medicine

## 2015-11-23 ENCOUNTER — Encounter: Payer: Self-pay | Admitting: Family Medicine

## 2015-11-23 VITALS — BP 116/78 | HR 76 | Temp 97.7°F | Ht 75.0 in | Wt 239.0 lb

## 2015-11-23 DIAGNOSIS — Z79899 Other long term (current) drug therapy: Secondary | ICD-10-CM

## 2015-11-23 DIAGNOSIS — E119 Type 2 diabetes mellitus without complications: Secondary | ICD-10-CM

## 2015-11-23 DIAGNOSIS — M26609 Unspecified temporomandibular joint disorder, unspecified side: Secondary | ICD-10-CM

## 2015-11-23 DIAGNOSIS — E785 Hyperlipidemia, unspecified: Secondary | ICD-10-CM

## 2015-11-23 DIAGNOSIS — Z79890 Hormone replacement therapy (postmenopausal): Secondary | ICD-10-CM

## 2015-11-23 DIAGNOSIS — Z5181 Encounter for therapeutic drug level monitoring: Secondary | ICD-10-CM

## 2015-11-23 LAB — CBC
HEMATOCRIT: 45.6 % (ref 39.0–52.0)
HEMOGLOBIN: 15.9 g/dL (ref 13.0–17.0)
MCHC: 34.9 g/dL (ref 30.0–36.0)
MCV: 92.4 fl (ref 78.0–100.0)
Platelets: 230 10*3/uL (ref 150.0–400.0)
RBC: 4.94 Mil/uL (ref 4.22–5.81)
RDW: 12.8 % (ref 11.5–15.5)
WBC: 6.5 10*3/uL (ref 4.0–10.5)

## 2015-11-23 LAB — LIPID PANEL
CHOLESTEROL: 221 mg/dL — AB (ref 0–200)
HDL: 43.6 mg/dL (ref >39.00)
LDL CALC: 148 mg/dL — AB (ref 0–99)
NonHDL: 177.14
TRIGLYCERIDES: 144 mg/dL (ref 0.0–149.0)
Total CHOL/HDL Ratio: 5
VLDL: 28.8 mg/dL (ref 0.0–40.0)

## 2015-11-23 LAB — HEMOGLOBIN A1C: Hgb A1c MFr Bld: 7.5 % — ABNORMAL HIGH (ref 4.6–6.5)

## 2015-11-23 LAB — PSA: PSA: 1.67 ng/mL (ref 0.10–4.00)

## 2015-11-23 MED ORDER — METFORMIN HCL 500 MG PO TABS: ORAL_TABLET | ORAL | Status: DC

## 2015-11-23 MED ORDER — CYCLOBENZAPRINE HCL 10 MG PO TABS: 10.0000 mg | ORAL_TABLET | Freq: Every day | ORAL | Status: DC

## 2015-11-23 NOTE — Progress Notes (Signed)
Pre visit review using our clinic review tool, if applicable. No additional management support is needed unless otherwise documented below in the visit note. 

## 2015-11-23 NOTE — Progress Notes (Signed)
[place] [facility] [address] [place], [place] [address] [phone]  Date:  11/23/2015   Name:  Randy Orr   DOB:  1950/10/07   MRN:  [id number]  PCP:  Lamar Blinks, MD    Chief Complaint: Medication Refill   History of Present Illness:  Randy Orr is a 65 y.o. very pleasant male patient who presents with the following: Established pt for a periodic recheck today History of DM, hypogonadism, hyperlipidemia, overweight. Here today for a periodic recheck.  Last labs in July of 2016- at that time his A1c was 6.3, LDL was a bit high PSA check is due for a check  He is on testosterone replacement for hypogonadism.  Last shot was almost a week ago- he uses them every 2 weeks.  He feels like his T may be a little low. He has felt a bit sluggish but is not sure if this is due to gaining weight again.  He is disappointed that he gained back the weight he lost las summer  Wt Readings from Last 3 Encounters:  11/23/15 239 lb (108.41 kg)  03/01/15 219 lb (99.338 kg)  07/18/14 235 lb (106.595 kg)    He and his wife bought a house and were doing some renovations. They moved in with his MIL for a few months and had to rely on her rather unhelathy cooking. They are now back in their own home and he hopes to get back on track.   His son is getting married soon- he is marrying a newly graduated family doctor.  They will be living in [place] which he is pleased about.   He recently underwent a lot of dental work on the right only and seemed to develop some right sided TMJ.  He is using ibuprofen which helps some. He would like to try something stronger perhaps for night as his jaw will be really sore on the right in the am   Patient Active Problem List   Diagnosis Date Noted  . Peripheral sensory neuropathy due to type 2 diabetes mellitus ([place]) 05/17/2014  . Snoring 05/17/2014  . Obesity 05/17/2014  . Diabetes mellitus  ([place]) 09/23/2011  . Hypogonadism male 09/23/2011  . Hyperlipidemia 09/23/2011    Past Medical History  Diagnosis Date  . Panic attacks   . Depression   . Low testosterone   . Peripheral sensory neuropathy due to type 2 diabetes mellitus ([place]) 05/17/2014  . Snoring 05/17/2014  . Obesity 05/17/2014  . Diabetes mellitus without complication ([place])     DM type 2    Past Surgical History  Procedure Laterality Date  . None      Social History  Substance Use Topics  . Smoking status: Former Research scientist (life sciences)  . Smokeless tobacco: Never Used     Comment: smoked 30 years ago.  . Alcohol Use: 3.0 oz/week    5 Glasses of wine per week    Family History  Problem Relation Age of Onset  . Diabetes Brother   . Heart attack Father   . Colon cancer Neg Hx   . Esophageal cancer Neg Hx   . Rectal cancer Neg Hx   . Stomach cancer Neg Hx     No Known Allergies  Medication list has been reviewed and updated.  Current Outpatient Prescriptions on File Prior to Visit  Medication Sig Dispense Refill  . Coenzyme Q10 (CO Q 10 PO) Take 1 tablet by mouth daily.    Marland Kitchen  l-methylfolate-B6-B12 (METANX) 3-35-2 MG TABS Take 1 tablet by mouth daily. 60 tablet 1  . metFORMIN (GLUCOPHAGE) 500 MG tablet TAKE 1 TABLET (500 MG TOTAL) BY MOUTH DAILY. 15 tablet 0  . Multiple Vitamin (MULTIVITAMIN) tablet Take 1 tablet by mouth daily.    Marland Kitchen testosterone cypionate (DEPOTESTOSTERONE CYPIONATE) 200 MG/ML injection INJECT 1 ML INTO THE MUSCLE EVERY 14 DAYS. 10 mL 5  . triamcinolone (KENALOG) 0.025 % cream Apply topically 2 (two) times daily. (Patient taking differently: Apply 1 application topically 2 (two) times daily as needed. ) 60 g 2   No current facility-administered medications on file prior to visit.    Review of Systems:  As per HPI- otherwise negative.   Physical Examination: Filed Vitals:   11/23/15 0854  BP: 116/78  Pulse: 76  Temp: 97.7 F (36.5 C)   Filed Vitals:   11/23/15 0854  Height: 6\' 3"   (1.905 m)  Weight: 239 lb (108.41 kg)   Body mass index is 29.87 kg/(m^2). Ideal Body Weight: Weight in (lb) to have BMI = 25: 199.6  GEN: WDWN, NAD, Non-toxic, A & O x 3, overweight, looks well HEENT: Atraumatic, Normocephalic. Neck supple. No masses, No LAD.  Bilateral TM wnl, oropharynx normal.  PEERL,EOMI.   Tenderness right TMJ with ROM of jaw Ears and Nose: No external deformity. CV: RRR, No M/G/R. No JVD. No thrill. No extra heart sounds. PULM: CTA B, no wheezes, crackles, rhonchi. No retractions. No resp. distress. No accessory muscle use. EXTR: No c/c/e NEURO Normal gait.  PSYCH: Normally interactive. Conversant. Not depressed or anxious appearing.  Calm demeanor.    Assessment and Plan: Type 2 diabetes mellitus without complication, without long-term current use of insulin (HCC) - Plan: Hemoglobin A1c, Lipid panel, metFORMIN (GLUCOPHAGE) 500 MG tablet  Hyperlipidemia - Plan: Hemoglobin A1c, Lipid panel  Encounter for monitoring testosterone replacement therapy - Plan: PSA, Testosterone Total,Free,Bio, Males, CBC  Medication monitoring encounter - Plan: CBC  TMJ (temporomandibular joint syndrome) - Plan: cyclobenzaprine (FLEXERIL) 10 MG tablet  Labs today to monitor his lipids May need to adjust his T as he is having some sx of weight gain and sluggishness- await level today CBC and PSA needed as he is on testosterone  Lipids were a bit high last check- monitor today DM- A1c check is due, he is generally well controlled Continue ibuprofen and can try flexeril at night for his TMJ pain Will plan further follow- up pending labs. See patient instructions for more details.     Signed Lamar Blinks, MD

## 2015-11-23 NOTE — Patient Instructions (Signed)
It was great to see you today- congrats on the new house and on your son's upcoming wedding! Try taking a flexeril muscle relaxer at night for your jaw pain. You can continue ibuprofen as well We will check your T level and adjust your medication if needed I will be back in touch with your labs asap Work on losing those 10 lbs again- the warmer weather can make this easier   Let's plan to recheck in 6-9 months

## 2015-11-24 LAB — TESTOSTERONE TOTAL,FREE,BIO, MALES
Albumin: 4.4 g/dL (ref 3.6–5.1)
SEX HORMONE BINDING: 21 nmol/L — AB (ref 22–77)
TESTOSTERONE FREE: 147 pg/mL (ref 47.0–244.0)
TESTOSTERONE: 705 ng/dL (ref 250–827)
Testosterone, Bioavailable: 296 ng/dL (ref 130.5–681.7)

## 2016-02-21 ENCOUNTER — Telehealth: Payer: Self-pay | Admitting: Family Medicine

## 2016-02-21 DIAGNOSIS — E291 Testicular hypofunction: Secondary | ICD-10-CM

## 2016-02-21 MED ORDER — TESTOSTERONE CYPIONATE 200 MG/ML IM SOLN: INTRAMUSCULAR | Status: DC

## 2016-02-21 NOTE — Telephone Encounter (Signed)
Refill request for testosterone cypionate.    Pharmacy: [id number] [place], [place]

## 2016-05-27 ENCOUNTER — Encounter: Payer: Self-pay | Admitting: Family Medicine

## 2016-05-27 ENCOUNTER — Ambulatory Visit (INDEPENDENT_AMBULATORY_CARE_PROVIDER_SITE_OTHER): Payer: Self-pay | Admitting: Family Medicine

## 2016-05-27 VITALS — BP 138/80 | HR 66 | Temp 97.7°F | Ht 75.0 in | Wt 237.4 lb

## 2016-05-27 DIAGNOSIS — M25562 Pain in left knee: Secondary | ICD-10-CM

## 2016-05-27 DIAGNOSIS — Z5181 Encounter for therapeutic drug level monitoring: Secondary | ICD-10-CM

## 2016-05-27 DIAGNOSIS — E119 Type 2 diabetes mellitus without complications: Secondary | ICD-10-CM

## 2016-05-27 DIAGNOSIS — G8929 Other chronic pain: Secondary | ICD-10-CM

## 2016-05-27 DIAGNOSIS — E782 Mixed hyperlipidemia: Secondary | ICD-10-CM

## 2016-05-27 LAB — COMPREHENSIVE METABOLIC PANEL
ALT: 40 U/L (ref 0–53)
AST: 21 U/L (ref 0–37)
Albumin: 4.2 g/dL (ref 3.5–5.2)
Alkaline Phosphatase: 40 U/L (ref 39–117)
BILIRUBIN TOTAL: 0.5 mg/dL (ref 0.2–1.2)
BUN: 13 mg/dL (ref 6–23)
CALCIUM: 8.8 mg/dL (ref 8.4–10.5)
CHLORIDE: 103 meq/L (ref 96–112)
CO2: 28 meq/L (ref 19–32)
Creatinine, Ser: 1.04 mg/dL (ref 0.40–1.50)
GFR: 76.63 mL/min (ref >60.00)
GLUCOSE: 157 mg/dL — AB (ref 70–99)
Potassium: 4.2 mEq/L (ref 3.5–5.1)
Sodium: 137 mEq/L (ref 135–145)
Total Protein: 6.8 g/dL (ref 6.0–8.3)

## 2016-05-27 LAB — MICROALBUMIN / CREATININE URINE RATIO
CREATININE, U: 148.2 mg/dL
MICROALB UR: 0.7 mg/dL (ref 0.0–1.9)
MICROALB/CREAT RATIO: 0.5 mg/g (ref 0.0–30.0)

## 2016-05-27 LAB — CBC
HEMATOCRIT: 43.8 % (ref 39.0–52.0)
HEMOGLOBIN: 15.3 g/dL (ref 13.0–17.0)
MCHC: 34.9 g/dL (ref 30.0–36.0)
MCV: 92.3 fl (ref 78.0–100.0)
PLATELETS: 231 10*3/uL (ref 150.0–400.0)
RBC: 4.74 Mil/uL (ref 4.22–5.81)
RDW: 12.9 % (ref 11.5–15.5)
WBC: 5.3 10*3/uL (ref 4.0–10.5)

## 2016-05-27 LAB — HEMOGLOBIN A1C: Hgb A1c MFr Bld: 6.7 % — ABNORMAL HIGH (ref 4.6–6.5)

## 2016-05-27 NOTE — Progress Notes (Signed)
[place] [facility] [address] [place], [place] [address] [phone]  Date:  05/27/2016   Name:  Randy Orr   DOB:  1951-06-13   MRN:  [id number]  PCP:  Lamar Blinks, MD    Chief Complaint: Follow-up (Pt here for f/u visit. Will get flu vaccine today. Would like to disucss getting a hearing test. c/o knee swelling and pain. )   History of Present Illness:  Randy Orr is a 65 y.o. very pleasant male patient who presents with the following:  Here today for a follow-up visit History of DM, hypogonadism, hyperlipidemia He had been obese but has lost weight through diet and exercise.  He thinks he has lost more than shown on our scale today, at least per his scale at home.   BP Readings from Last 3 Encounters:  05/27/16 138/80  11/23/15 116/78  03/01/15 110/70   Lab Results  Component Value Date   HGBA1C 7.5 (H) 11/23/2015   Due for an A1c check, last lipids done in April as well He does his testosterone injections at home without a problem.  He is mid- way between injections now Last T level in April PSA in April of this year- normal at 1.67  Due for micro albumin, flu shot, A1c, eye exam  Wt Readings from Last 3 Encounters:  05/27/16 237 lb 6.4 oz (107.7 kg)  11/23/15 239 lb (108.4 kg)  03/01/15 219 lb (99.3 kg)   He is doing a "5-2" diet and has lost some weight.  He is overall doing well- his step- son got his driver license and is becoming more independent, but he can still be tough to deal with. He lives with them 50% of the time and is not very helpful around the house.    He has thought about getting a hearing test- he has noted some difficulty hearing at work.    He has also had some trouble with his left knee- it has been hurting "to some level" for the last 6 months. It is often swollen.  He thinks he may have a meniscal tear.  The knee does not click, pop or get stuck. The knee does hold him  back from his usual activities and can have pain with stairs.    He will get his flu shot at the drugstore for cost savings.    Patient Active Problem List   Diagnosis Date Noted  . Peripheral sensory neuropathy due to type 2 diabetes mellitus ([place]) 05/17/2014  . Snoring 05/17/2014  . Obesity 05/17/2014  . Diabetes mellitus ([place]) 09/23/2011  . Hypogonadism male 09/23/2011  . Hyperlipidemia 09/23/2011    Past Medical History:  Diagnosis Date  . Depression   . Diabetes mellitus without complication ([place])    DM type 2  . Low testosterone   . Obesity 05/17/2014  . Panic attacks   . Peripheral sensory neuropathy due to type 2 diabetes mellitus ([place]) 05/17/2014  . Snoring 05/17/2014    Past Surgical History:  Procedure Laterality Date  . none      Social History  Substance Use Topics  . Smoking status: Former Research scientist (life sciences)  . Smokeless tobacco: Never Used     Comment: smoked 30 years ago.  . Alcohol use 3.0 oz/week    5 Glasses of wine per week    Family History  Problem Relation Age of Onset  . Diabetes Brother   . Heart attack Father   .  Colon cancer Neg Hx   . Esophageal cancer Neg Hx   . Rectal cancer Neg Hx   . Stomach cancer Neg Hx     No Known Allergies  Medication list has been reviewed and updated.  Current Outpatient Prescriptions on File Prior to Visit  Medication Sig Dispense Refill  . Coenzyme Q10 (CO Q 10 PO) Take 1 tablet by mouth daily.    . cyclobenzaprine (FLEXERIL) 10 MG tablet Take 1 tablet (10 mg total) by mouth at bedtime. Use as needed for TMJ pain 30 tablet 0  . l-methylfolate-B6-B12 (METANX) 3-35-2 MG TABS Take 1 tablet by mouth daily. 60 tablet 1  . metFORMIN (GLUCOPHAGE) 500 MG tablet TAKE 1 TABLET (500 MG TOTAL) BY MOUTH DAILY. 90 tablet 3  . Multiple Vitamin (MULTIVITAMIN) tablet Take 1 tablet by mouth daily.    . Multiple Vitamins-Minerals (ZINC PO) Take by mouth.    . testosterone cypionate (DEPOTESTOSTERONE CYPIONATE) 200 MG/ML injection  INJECT 1 ML INTO THE MUSCLE EVERY 14 DAYS. 6 mL 1  . triamcinolone (KENALOG) 0.025 % cream Apply topically 2 (two) times daily. (Patient taking differently: Apply 1 application topically 2 (two) times daily as needed. ) 60 g 2   No current facility-administered medications on file prior to visit.     Review of Systems:  As per HPI- otherwise negative. No fever, chills, nausea or vomiting He has tried ibuprofen for his knee for a while, but does not like to have to take it every day. He also uses naproxen as needed, maybe once a day.    Physical Examination: Vitals:   05/27/16 0908  BP: 138/80  Pulse: 66  Temp: 97.7 F (36.5 C)   Vitals:   05/27/16 0908  Weight: 237 lb 6.4 oz (107.7 kg)  Height: 6\' 3"  (1.905 m)   Body mass index is 29.67 kg/m. Ideal Body Weight: Weight in (lb) to have BMI = 25: 199.6  GEN: WDWN, NAD, Non-toxic, A & O x 3, looks well,  HEENT: Atraumatic, Normocephalic. Neck supple. No masses, No LAD.  Bilateral TM wnl, oropharynx normal.  PEERL,EOMI.   Ears and Nose: No external deformity. CV: RRR, No M/G/R. No JVD. No thrill. No extra heart sounds. PULM: CTA B, no wheezes, crackles, rhonchi. No retractions. No resp. distress. No accessory muscle use. ABD: S, NT, ND, +BS. No rebound. No HSM. EXTR: No c/c/e NEURO Normal gait.  PSYCH: Normally interactive. Conversant. Not depressed or anxious appearing.  Calm demeanor.  Left knee: he has mild crepitus, no effusion, heat or redness, knee is stable, tender at medial joint line  He has had knee films per his chiropractor and will get a copy of his films.    Assessment and Plan: Type 2 diabetes mellitus without complication, without long-term current use of insulin ([place]) - Plan: Urine Microalbumin w/creat. ratio, Comprehensive metabolic panel, Hemoglobin A1C  Mixed hyperlipidemia  Medication monitoring encounter - Plan: CBC, Testosterone Total,Free,Bio, Males  Chronic pain of left knee  Here today for a  periodic recheck Follow-up on his DM today CBC and testosterone today Lipids done in April For the time being he does not wish to do anything further for his knee due to finances.  He will let me know if this changes.  His chiropractor did films for him and he will get a copy  Signed Lamar Blinks, MD

## 2016-05-27 NOTE — Progress Notes (Signed)
Pre visit review using our clinic review tool, if applicable. No additional management support is needed unless otherwise documented below in the visit note. 

## 2016-05-27 NOTE — Patient Instructions (Addendum)
It was good to see you today- we will be in touch with your labs asap  We are glad to help you see a sports doc for you knee if needed- let me know  You can certainly get your flu shot at the drug store- this is the least expensive way to get the shot.   Keep up the good work with your diet  It does look like you are due for an eye exam- please get this done when you can, a lower cost eye center such as at [facility] or [facility] is fine!

## 2016-05-28 LAB — TESTOSTERONE TOTAL,FREE,BIO, MALES
Albumin: 4.2 g/dL (ref 3.6–5.1)
Sex Hormone Binding: 25 nmol/L (ref 22–77)
TESTOSTERONE BIOAVAILABLE: 134.3 ng/dL (ref 110.0–575.0)
Testosterone, Free: 69.7 pg/mL (ref 46.0–224.0)
Testosterone: 415 ng/dL (ref 250–827)

## 2016-05-29 ENCOUNTER — Encounter: Payer: Self-pay | Admitting: Family Medicine

## 2016-05-29 ENCOUNTER — Telehealth: Payer: Self-pay | Admitting: Family Medicine

## 2016-05-29 DIAGNOSIS — M25562 Pain in left knee: Secondary | ICD-10-CM

## 2016-05-29 NOTE — Telephone Encounter (Signed)
Pt dropped off Disc for PCP to have and see and when done seeing disc, pt would like to be called to pick it up at tel [phone]. Disc given to [place].

## 2016-06-04 ENCOUNTER — Ambulatory Visit (INDEPENDENT_AMBULATORY_CARE_PROVIDER_SITE_OTHER): Payer: Self-pay | Admitting: Family Medicine

## 2016-06-04 ENCOUNTER — Encounter: Payer: Self-pay | Admitting: Family Medicine

## 2016-06-04 DIAGNOSIS — G8929 Other chronic pain: Secondary | ICD-10-CM

## 2016-06-04 DIAGNOSIS — M25562 Pain in left knee: Secondary | ICD-10-CM

## 2016-06-04 NOTE — Patient Instructions (Signed)
Your knee pain is either due to arthritis or a degenerative meniscus tear. Both are treated similarly initially. These are the different classes of medicine you can take for this: Tylenol 500mg  1-2 tabs three times a day for pain. Aleve 2 tabs twice a day with food for pain and inflammation (we could try a different one if this isn't helping enough). Glucosamine sulfate 750mg  twice a day is a supplement that may help. Capsaicin, aspercreme, or biofreeze topically up to four times a day may also help with pain. Cortisone injections are an option. If cortisone injections do not help, there are different types of shots that may help but they take longer to take effect. It's important that you continue to stay active. Straight leg raises, knee extensions 3 sets of 10 once a day (add ankle weight if these become too easy). Consider physical therapy to strengthen muscles around the joint that hurts to take pressure off of the joint itself. Shoe inserts with good arch support may be helpful. Heat or ice 15 minutes at a time 3-4 times a day as needed to help with pain. Follow up with me in 1 month or as needed. Call me if you want to go ahead with physical therapy or injection.

## 2016-06-06 DIAGNOSIS — M25562 Pain in left knee: Secondary | ICD-10-CM | POA: Insufficient documentation

## 2016-06-06 NOTE — Progress Notes (Signed)
PCP and consultation requested by: Lamar Blinks, MD  Subjective:   HPI: Patient is a 65 y.o. male here for left knee pain.  Patient reports he's had anterior left knee pain since April 2017. Reports achy sensation - currently 1/10 level. Pain is constant, worse after a lot of activity. Tried icing, ibuprofen, naproxen. Bothers him when squatting as well. Went for a walk on Saturday and next day knee was swollen. Tried inserts with some improvement. Sees a Restaurant manager, fast food. No skin changes, numbness.  Past Medical History:  Diagnosis Date  . Depression   . Diabetes mellitus without complication ([place])    DM type 2  . Low testosterone   . Obesity 05/17/2014  . Panic attacks   . Peripheral sensory neuropathy due to type 2 diabetes mellitus ([place]) 05/17/2014  . Snoring 05/17/2014    Current Outpatient Prescriptions on File Prior to Visit  Medication Sig Dispense Refill  . Coenzyme Q10 (CO Q 10 PO) Take 1 tablet by mouth daily.    . cyclobenzaprine (FLEXERIL) 10 MG tablet Take 1 tablet (10 mg total) by mouth at bedtime. Use as needed for TMJ pain 30 tablet 0  . l-methylfolate-B6-B12 (METANX) 3-35-2 MG TABS Take 1 tablet by mouth daily. 60 tablet 1  . metFORMIN (GLUCOPHAGE) 500 MG tablet TAKE 1 TABLET (500 MG TOTAL) BY MOUTH DAILY. 90 tablet 3  . Multiple Vitamin (MULTIVITAMIN) tablet Take 1 tablet by mouth daily.    . Multiple Vitamins-Minerals (ZINC PO) Take by mouth.    . testosterone cypionate (DEPOTESTOSTERONE CYPIONATE) 200 MG/ML injection INJECT 1 ML INTO THE MUSCLE EVERY 14 DAYS. 6 mL 1  . triamcinolone (KENALOG) 0.025 % cream Apply topically 2 (two) times daily. (Patient taking differently: Apply 1 application topically 2 (two) times daily as needed. ) 60 g 2   No current facility-administered medications on file prior to visit.     Past Surgical History:  Procedure Laterality Date  . none      No Known Allergies  Social History   Social History  . Marital status:  Divorced    Spouse name: N/A  . Number of children: 1  . Years of education: AAS   Occupational History  . [employer] History Main Topics  . Smoking status: Former Research scientist (life sciences)  . Smokeless tobacco: Never Used     Comment: smoked 30 years ago.  . Alcohol use 3.0 oz/week    5 Glasses of wine per week  . Drug use: No  . Sexual activity: Not on file   Other Topics Concern  . Not on file   Social History Narrative  . No narrative on file    Family History  Problem Relation Age of Onset  . Diabetes Brother   . Heart attack Father   . Colon cancer Neg Hx   . Esophageal cancer Neg Hx   . Rectal cancer Neg Hx   . Stomach cancer Neg Hx     BP 133/87   Pulse 89   Ht 6\' 4"  (1.93 m)   Wt 227 lb (103 kg)   BMI 27.63 kg/m   Review of Systems: See HPI above.    Objective:  Physical Exam:  Gen: NAD, comfortable in exam room  Left knee: No gross deformity, ecchymoses, effusion. No TTP. FROM. Negative ant/post drawers. Negative valgus/varus testing. Negative lachmanns. Negative mcmurrays, apleys, patellar apprehension. NV intact distally.  Right knee: FROM without pain.    Assessment & Plan:  1. Left  knee pain - exam reassuring.  History consistent with flare of arthritis vs degenerative meniscus tear.  Discussed tylenol, aleve, glucosamine, topical medications.  He will consider injection, physical therapy.  Follow up with [place] in 1 month or prn.

## 2016-06-06 NOTE — Assessment & Plan Note (Signed)
exam reassuring.  History consistent with flare of arthritis vs degenerative meniscus tear.  Discussed tylenol, aleve, glucosamine, topical medications.  He will consider injection, physical therapy.  Follow up with [place] in 1 month or prn.

## 2016-08-19 ENCOUNTER — Other Ambulatory Visit: Payer: Self-pay | Admitting: Family Medicine

## 2016-08-19 DIAGNOSIS — E291 Testicular hypofunction: Secondary | ICD-10-CM

## 2016-08-20 ENCOUNTER — Other Ambulatory Visit: Payer: Self-pay | Admitting: Emergency Medicine

## 2016-08-20 DIAGNOSIS — E291 Testicular hypofunction: Secondary | ICD-10-CM

## 2016-08-20 MED ORDER — TESTOSTERONE CYPIONATE 200 MG/ML IM SOLN: INTRAMUSCULAR | 1 refills | Status: DC

## 2016-08-20 NOTE — Telephone Encounter (Signed)
Received refill request for testosterone cypionate (DEPOTESTOSTERONE CYPIONATE) 200 MG/ML injection. Last office visit 05/27/16 and last refill 02/21/16. Is it ok to refill? Please advise.

## 2016-10-30 LAB — HM DIABETES EYE EXAM

## 2016-11-07 ENCOUNTER — Other Ambulatory Visit: Payer: Self-pay | Admitting: Family Medicine

## 2016-11-07 DIAGNOSIS — E119 Type 2 diabetes mellitus without complications: Secondary | ICD-10-CM

## 2016-11-25 ENCOUNTER — Ambulatory Visit: Payer: Self-pay | Admitting: Family Medicine

## 2017-01-01 ENCOUNTER — Encounter: Payer: Self-pay | Admitting: Family Medicine

## 2017-01-01 ENCOUNTER — Ambulatory Visit (INDEPENDENT_AMBULATORY_CARE_PROVIDER_SITE_OTHER): Payer: Self-pay | Admitting: Family Medicine

## 2017-01-01 VITALS — BP 120/78 | HR 75 | Temp 98.0°F | Ht 75.75 in | Wt 221.8 lb

## 2017-01-01 DIAGNOSIS — R229 Localized swelling, mass and lump, unspecified: Secondary | ICD-10-CM

## 2017-01-01 DIAGNOSIS — Z125 Encounter for screening for malignant neoplasm of prostate: Secondary | ICD-10-CM

## 2017-01-01 DIAGNOSIS — Z1159 Encounter for screening for other viral diseases: Secondary | ICD-10-CM

## 2017-01-01 DIAGNOSIS — E291 Testicular hypofunction: Secondary | ICD-10-CM

## 2017-01-01 DIAGNOSIS — Z5181 Encounter for therapeutic drug level monitoring: Secondary | ICD-10-CM

## 2017-01-01 DIAGNOSIS — E782 Mixed hyperlipidemia: Secondary | ICD-10-CM

## 2017-01-01 DIAGNOSIS — E119 Type 2 diabetes mellitus without complications: Secondary | ICD-10-CM

## 2017-01-01 DIAGNOSIS — L309 Dermatitis, unspecified: Secondary | ICD-10-CM

## 2017-01-01 LAB — CBC
HCT: 46.5 % (ref 39.0–52.0)
Hemoglobin: 16 g/dL (ref 13.0–17.0)
MCHC: 34.3 g/dL (ref 30.0–36.0)
MCV: 93.3 fl (ref 78.0–100.0)
Platelets: 251 10*3/uL (ref 150.0–400.0)
RBC: 4.98 Mil/uL (ref 4.22–5.81)
RDW: 12.7 % (ref 11.5–15.5)
WBC: 6 10*3/uL (ref 4.0–10.5)

## 2017-01-01 LAB — LIPID PANEL
CHOL/HDL RATIO: 6
Cholesterol: 232 mg/dL — ABNORMAL HIGH (ref 0–200)
HDL: 42 mg/dL (ref >39.00)
LDL CALC: 166 mg/dL — AB (ref 0–99)
NonHDL: 190.4
Triglycerides: 120 mg/dL (ref 0.0–149.0)
VLDL: 24 mg/dL (ref 0.0–40.0)

## 2017-01-01 LAB — COMPREHENSIVE METABOLIC PANEL
ALBUMIN: 4.3 g/dL (ref 3.5–5.2)
ALT: 31 U/L (ref 0–53)
AST: 20 U/L (ref 0–37)
Alkaline Phosphatase: 47 U/L (ref 39–117)
BUN: 16 mg/dL (ref 6–23)
CHLORIDE: 105 meq/L (ref 96–112)
CO2: 27 meq/L (ref 19–32)
CREATININE: 1.15 mg/dL (ref 0.40–1.50)
Calcium: 9.2 mg/dL (ref 8.4–10.5)
GFR: 68.1 mL/min (ref >60.00)
GLUCOSE: 158 mg/dL — AB (ref 70–99)
POTASSIUM: 4.5 meq/L (ref 3.5–5.1)
SODIUM: 138 meq/L (ref 135–145)
Total Bilirubin: 0.4 mg/dL (ref 0.2–1.2)
Total Protein: 6.5 g/dL (ref 6.0–8.3)

## 2017-01-01 LAB — HEMOGLOBIN A1C: HEMOGLOBIN A1C: 7.2 % — AB (ref 4.6–6.5)

## 2017-01-01 LAB — PSA: PSA: 2.16 ng/mL (ref 0.10–4.00)

## 2017-01-01 MED ORDER — TRIAMCINOLONE ACETONIDE 0.025 % EX CREA: TOPICAL_CREAM | Freq: Two times a day (BID) | CUTANEOUS | 2 refills | Status: DC

## 2017-01-01 NOTE — Progress Notes (Addendum)
[place] at [facility] [address] [place], [place] [address] [phone]  Date:  01/01/2017   Name:  Randy Orr   DOB:  15-Jul-1951   MRN:  [id number]  PCP:  [name], MD    Chief Complaint: Follow-up (Pt here for f/u visit with labs. Request refill on Triamcinolone Acetonide Cream.)   History of Present Illness:  Randy Orr is a 66 y.o. very pleasant male patient who presents with the following:  History of hyperlipidemia, DM, hypogonadism. Here today for a periodic recheck.  Last seen here in October of 2017 Needs PSA, hep C in addition to other monitoring labs today Lab Results  Component Value Date   HGBA1C 6.7 (H) 05/27/2016   BP Readings from Last 3 Encounters:  01/01/17 120/78  06/04/16 133/87  05/27/16 138/80    Also due foot exam which we will do today He was seen at [facility] for a URI not long ago- treated with zpack and cough meds, nasal spray.  Finished the zpack He is now on the upswing from recent illness although not 100% well yet He is in the middle of his T shot schedule, takes q 2 weeks- tolerating this well  His wife Maudie Mercury is concerned about a spot on his shoulder which he would like for me to examine.  He also has noted a spot on his scalp for 3 years or so  He has been feeling well except for recent illness.  He has tried some intermittent fasting.  He has lost several lbs by doing this and is pleased with his progress   Wt Readings from Last 3 Encounters:  01/01/17 221 lb 12.8 oz (100.6 kg)  06/04/16 227 lb (103 kg)  05/27/16 237 lb 6.4 oz (107.7 kg)      Patient Active Problem List   Diagnosis Date Noted  . Left knee pain 06/06/2016  . Peripheral sensory neuropathy due to type 2 diabetes mellitus ([place]) 05/17/2014  . Snoring 05/17/2014  . Obesity 05/17/2014  . Diabetes mellitus ([place]) 09/23/2011  . Hypogonadism male 09/23/2011  . Hyperlipidemia 09/23/2011    Past Medical  History:  Diagnosis Date  . Depression   . Diabetes mellitus without complication ([place])    DM type 2  . Low testosterone   . Obesity 05/17/2014  . Panic attacks   . Peripheral sensory neuropathy due to type 2 diabetes mellitus ([place]) 05/17/2014  . Snoring 05/17/2014    Past Surgical History:  Procedure Laterality Date  . none      Social History  Substance Use Topics  . Smoking status: Former Research scientist (life sciences)  . Smokeless tobacco: Never Used     Comment: smoked 30 years ago.  . Alcohol use 3.0 oz/week    5 Glasses of wine per week    Family History  Problem Relation Age of Onset  . Diabetes Brother   . Heart attack Father   . Colon cancer Neg Hx   . Esophageal cancer Neg Hx   . Rectal cancer Neg Hx   . Stomach cancer Neg Hx     No Known Allergies  Medication list has been reviewed and updated.  Current Outpatient Prescriptions on File Prior to Visit  Medication Sig Dispense Refill  . l-methylfolate-B6-B12 (METANX) 3-35-2 MG TABS Take 1 tablet by mouth daily. 60 tablet 1  . metFORMIN (GLUCOPHAGE) 500 MG tablet TAKE 1 TABLET (500 MG TOTAL) BY MOUTH DAILY. 90 tablet 3  .  Multiple Vitamin (MULTIVITAMIN) tablet Take 1 tablet by mouth daily.    . Multiple Vitamins-Minerals (ZINC PO) Take by mouth.    . testosterone cypionate (DEPOTESTOSTERONE CYPIONATE) 200 MG/ML injection INJECT 1 ML INTO THE MUSCLE EVERY 14 DAYS. 6 mL 1  . Coenzyme Q10 (CO Q 10 PO) Take 1 tablet by mouth daily.    . cyclobenzaprine (FLEXERIL) 10 MG tablet Take 1 tablet (10 mg total) by mouth at bedtime. Use as needed for TMJ pain (Patient not taking: Reported on 01/01/2017) 30 tablet 0   No current facility-administered medications on file prior to visit.     Review of Systems:  As per HPI- otherwise negative.   Physical Examination: Vitals:   01/01/17 0904  BP: 120/78  Pulse: 75  Temp: 98 F (36.7 C)   Vitals:   01/01/17 0904  Weight: 221 lb 12.8 oz (100.6 kg)  Height: 6' 3.75" (1.924 m)   Body  mass index is 27.18 kg/m. Ideal Body Weight: Weight in (lb) to have BMI = 25: 203.6  GEN: WDWN, NAD, Non-toxic, A & O x 3, looks well, mild overweight HEENT: Atraumatic, Normocephalic. Neck supple. No masses, No LAD.  Bilateral TM wnl, oropharynx normal.  PEERL,EOMI.   Ears and Nose: No external deformity. CV: RRR, No M/G/R. No JVD. No thrill. No extra heart sounds. PULM: CTA B, no wheezes, crackles, rhonchi. No retractions. No resp. distress. No accessory muscle use. ABD: S, NT, ND, +BS. No rebound. No HSM. EXTR: No c/c/e NEURO Normal gait.  PSYCH: Normally interactive. Conversant. Not depressed or anxious appearing.  Calm demeanor.  Normal foot exam Nodule right shoulder- raised, firm nodule approx 99mm in diameter  Assessment and Plan: Type 2 diabetes mellitus without complication, without long-term current use of insulin (HCC) - Plan: Comprehensive metabolic panel, Hemoglobin A1c  Mixed hyperlipidemia - Plan: Lipid panel  Medication monitoring encounter - Plan: CBC, Comprehensive metabolic panel, PSA, Testosterone Total,Free,Bio, Males  Hypogonadism male - Plan: PSA, Testosterone Total,Free,Bio, Males  Screening for prostate cancer - Plan: PSA  Encounter for hepatitis C screening test for low risk patient - Plan: Hepatitis C antibody  Eczema, unspecified type - Plan: triamcinolone (KENALOG) 0.025 % cream  Skin nodule  Here today for a followup visit Await A1c Lipid panel today Monitoring for his T therapy PSA Hep C screening  Refilled his eczema medication Will come back for skin nodule bx another day  Received his labs 5/24- message to pt  Results for orders placed or performed in visit on 01/01/17  CBC  Result Value Ref Range   WBC 6.0 4.0 - 10.5 K/uL   RBC 4.98 4.22 - 5.81 Mil/uL   Platelets 251.0 150.0 - 400.0 K/uL   Hemoglobin 16.0 13.0 - 17.0 g/dL   HCT 46.5 39.0 - 52.0 %   MCV 93.3 78.0 - 100.0 fl   MCHC 34.3 30.0 - 36.0 g/dL   RDW 12.7 11.5 - 15.5 %   Comprehensive metabolic panel  Result Value Ref Range   Sodium 138 135 - 145 mEq/L   Potassium 4.5 3.5 - 5.1 mEq/L   Chloride 105 96 - 112 mEq/L   CO2 27 19 - 32 mEq/L   Glucose, Bld 158 (H) 70 - 99 mg/dL   BUN 16 6 - 23 mg/dL   Creatinine, Ser 1.15 0.40 - 1.50 mg/dL   Total Bilirubin 0.4 0.2 - 1.2 mg/dL   Alkaline Phosphatase 47 39 - 117 U/L   AST 20 0 - 37 U/L  ALT 31 0 - 53 U/L   Total Protein 6.5 6.0 - 8.3 g/dL   Albumin 4.3 3.5 - 5.2 g/dL   Calcium 9.2 8.4 - 10.5 mg/dL   GFR 68.10 >60.00 mL/min  Hemoglobin A1c  Result Value Ref Range   Hgb A1c MFr Bld 7.2 (H) 4.6 - 6.5 %  Hepatitis C antibody  Result Value Ref Range   HCV Ab NEGATIVE NEGATIVE  Lipid panel  Result Value Ref Range   Cholesterol 232 (H) 0 - 200 mg/dL   Triglycerides 120.0 0.0 - 149.0 mg/dL   HDL 42.00 >39.00 mg/dL   VLDL 24.0 0.0 - 40.0 mg/dL   LDL Cholesterol 166 (H) 0 - 99 mg/dL   Total CHOL/HDL Ratio 6    NonHDL 190.40   PSA  Result Value Ref Range   PSA 2.16 0.10 - 4.00 ng/mL  Testosterone Total,Free,Bio, Males  Result Value Ref Range   Testosterone 824 250 - 827 ng/dL   Albumin 4.1 3.6 - 5.1 g/dL   Sex Hormone Binding 24 22 - 77 nmol/L   Testosterone, Free 167.5 46.0 - 224.0 pg/mL   Testosterone, Bioavailable 315.4 110.0 - 575.0 ng/dL     Signed Lamar Blinks, MD

## 2017-01-01 NOTE — Patient Instructions (Addendum)
It was excellent to see you today!  Take care and I will be in touch with your labs asap Great job with weight loss!  Hopefully we will see this reflected in your A1c today I would like to take the skin lesion off your right shoulder at your convenience in the next couple of months.  Please schedule a 30 minute appt with me at some point this summer

## 2017-01-02 LAB — HEPATITIS C ANTIBODY: HCV Ab: NEGATIVE

## 2017-01-02 LAB — TESTOSTERONE TOTAL,FREE,BIO, MALES
Albumin: 4.1 g/dL (ref 3.6–5.1)
SEX HORMONE BINDING: 24 nmol/L (ref 22–77)
TESTOSTERONE FREE: 167.5 pg/mL (ref 46.0–224.0)
Testosterone, Bioavailable: 315.4 ng/dL (ref 110.0–575.0)
Testosterone: 824 ng/dL (ref 250–827)

## 2017-01-05 ENCOUNTER — Encounter: Payer: Self-pay | Admitting: Family Medicine

## 2017-01-07 ENCOUNTER — Other Ambulatory Visit: Payer: Self-pay | Admitting: Family Medicine

## 2017-01-07 DIAGNOSIS — Z5181 Encounter for therapeutic drug level monitoring: Secondary | ICD-10-CM

## 2017-02-04 ENCOUNTER — Other Ambulatory Visit: Payer: Self-pay | Admitting: Family Medicine

## 2017-02-04 DIAGNOSIS — E291 Testicular hypofunction: Secondary | ICD-10-CM

## 2017-02-05 ENCOUNTER — Other Ambulatory Visit: Payer: Self-pay | Admitting: Family Medicine

## 2017-02-05 ENCOUNTER — Other Ambulatory Visit: Payer: Self-pay | Admitting: Emergency Medicine

## 2017-02-05 DIAGNOSIS — E291 Testicular hypofunction: Secondary | ICD-10-CM

## 2017-02-05 MED ORDER — TESTOSTERONE CYPIONATE 200 MG/ML IM SOLN: INTRAMUSCULAR | 1 refills | Status: DC

## 2017-02-05 NOTE — Telephone Encounter (Signed)
Seen by myself a month ago Last T labs recent, levels fine.   Not polycythemic Lab Results  Component Value Date   PSA 2.16 01/01/2017   PSA 1.67 11/23/2015   PSA 1.64 10/20/2013   NCCSR: last filled T from myself on 11/09/16 No unexpected entries Ok to fill today

## 2017-02-05 NOTE — Telephone Encounter (Signed)
Relation to ZX:YOFV Call back number:[phone]   Reason for call:  Patient checking on th status of testosterone cypionate (DEPOTESTOSTERONE CYPIONATE) 200 MG/ML injection request, completely out requesting Rx sent to   [id number] [place], [place] (Phone) [phone] (Fax)

## 2017-02-05 NOTE — Telephone Encounter (Signed)
Request has been sent and is awaiting provider approval. Will be sent by end of day today.

## 2017-02-06 ENCOUNTER — Other Ambulatory Visit: Payer: Self-pay | Admitting: Emergency Medicine

## 2017-02-06 DIAGNOSIS — E291 Testicular hypofunction: Secondary | ICD-10-CM

## 2017-02-06 MED ORDER — TESTOSTERONE CYPIONATE 200 MG/ML IM SOLN
INTRAMUSCULAR | 1 refills | Status: DC
Start: 2017-02-06 — End: 2017-07-24

## 2017-02-06 NOTE — Telephone Encounter (Signed)
Pt has been made aware. He has picked up.

## 2017-02-06 NOTE — Telephone Encounter (Signed)
[facility]

## 2017-02-06 NOTE — Telephone Encounter (Signed)
Pt called in because he need to have his Rx (testosterone)  go to the [id number] [place],  - [place].    Pt says that he will be leaving to go out of town today and need to have it sent as soon as possible if possible.

## 2017-02-13 ENCOUNTER — Ambulatory Visit (INDEPENDENT_AMBULATORY_CARE_PROVIDER_SITE_OTHER): Payer: Self-pay | Admitting: Family Medicine

## 2017-02-13 VITALS — BP 119/68 | HR 76 | Temp 98.0°F | Ht 75.0 in | Wt 223.6 lb

## 2017-02-13 DIAGNOSIS — L989 Disorder of the skin and subcutaneous tissue, unspecified: Secondary | ICD-10-CM

## 2017-02-13 NOTE — Patient Instructions (Signed)
I should get your pathology report back in about 1 week and will be in touch with your results  WOUND CARE  . Keep area clean and dry for 24 hours. Do not remove bandage, if applied. . After 24 hours, remove bandage and wash wound gently with mild soap and warm water. Reapply a new bandage after cleaning wound, if directed. . Continue daily cleansing with soap and water until stitches/staples are removed. . Do not apply any ointments or creams to the wound Until fully scabbed over, as this may cause delayed healing. . Notify the office if you experience any of the following signs of infection: Swelling, redness, pus drainage, streaking, fever >101.0 F . Notify the office if you experience excessive bleeding that does not stop after 15-20 minutes of constant, firm pressure.

## 2017-02-13 NOTE — Progress Notes (Signed)
[place] at [facility] [address], [place], [place] [address] [phone]  Date:  02/13/2017   Name:  Randy Orr   DOB:  11/15/1950   MRN:  [id number]  PCP:  [name], MD    Chief Complaint: Cyst (Removal)   History of Present Illness:  Randy Orr is a 66 y.o. very pleasant male patient who presents with the following:  Seen here a few weeks ago- has returned to have a skin lesion removed from his back.   He has noted this lesion for quite some time, and wanted to make sure that it is not cancerous  Patient Active Problem List   Diagnosis Date Noted  . Left knee pain 06/06/2016  . Peripheral sensory neuropathy due to type 2 diabetes mellitus ([place]) 05/17/2014  . Snoring 05/17/2014  . Obesity 05/17/2014  . Diabetes mellitus ([place]) 09/23/2011  . Hypogonadism male 09/23/2011  . Hyperlipidemia 09/23/2011    Past Medical History:  Diagnosis Date  . Depression   . Diabetes mellitus without complication ([place])    DM type 2  . Low testosterone   . Obesity 05/17/2014  . Panic attacks   . Peripheral sensory neuropathy due to type 2 diabetes mellitus ([place]) 05/17/2014  . Snoring 05/17/2014    Past Surgical History:  Procedure Laterality Date  . none      Social History  Substance Use Topics  . Smoking status: Former Research scientist (life sciences)  . Smokeless tobacco: Never Used     Comment: smoked 30 years ago.  . Alcohol use 3.0 oz/week    5 Glasses of wine per week    Family History  Problem Relation Age of Onset  . Diabetes Brother   . Heart attack Father   . Colon cancer Neg Hx   . Esophageal cancer Neg Hx   . Rectal cancer Neg Hx   . Stomach cancer Neg Hx     No Known Allergies  Medication list has been reviewed and updated.  Current Outpatient Prescriptions on File Prior to Visit  Medication Sig Dispense Refill  . Coenzyme Q10 (CO Q 10 PO) Take 1 tablet by mouth daily.    Marland Kitchen l-methylfolate-B6-B12 (METANX)  3-35-2 MG TABS Take 1 tablet by mouth daily. 60 tablet 1  . metFORMIN (GLUCOPHAGE) 500 MG tablet TAKE 1 TABLET (500 MG TOTAL) BY MOUTH DAILY. 90 tablet 3  . Multiple Vitamin (MULTIVITAMIN) tablet Take 1 tablet by mouth daily.    . Multiple Vitamins-Minerals (ZINC PO) Take by mouth.    . testosterone cypionate (DEPOTESTOSTERONE CYPIONATE) 200 MG/ML injection INJECT 1 ML INTO THE MUSCLE EVERY 14 DAYS. 6 mL 1  . triamcinolone (KENALOG) 0.025 % cream Apply topically 2 (two) times daily. 80 g 2  . ipratropium (ATROVENT) 0.06 % nasal spray USE 2 SPRAYS IN EACH NOSTRIL 3 TIMES A DAY FOR 7 DAYS  0   No current facility-administered medications on file prior to visit.     Review of Systems:  As per HPI- otherwise negative.   Physical Examination: Vitals:   02/13/17 1358  BP: 119/68  Pulse: 76  Temp: 98 F (36.7 C)   Vitals:   02/13/17 1358  Weight: 223 lb 9.6 oz (101.4 kg)  Height: 6\' 3"  (1.905 m)   Body mass index is 27.95 kg/m. Ideal Body Weight: Weight in (lb) to have BMI = 25: 199.6   GEN: WDWN, NAD, Non-toxic, Alert & Oriented x 3 HEENT: Atraumatic,  Normocephalic.  Ears and Nose: No external deformity. EXTR: No clubbing/cyanosis/edema NEURO: Normal gait.  PSYCH: Normally interactive. Conversant. Not depressed or anxious appearing.  Calm demeanor.  Right posterior shoulder displays a 70mm diamter skin lesion- most likely a fibroma  VC obtained.  Prepped area with betadine and achieved anesthesia with 74ml of 2% lido.  Area draped. Used 11mm punch to obtain bx of entire lesion, lesion to path.  Applied bandage.  Pt tolerated well  Assessment and Plan: Skin lesion - Plan: Dermatology pathology  Punch bx, specimen to path Pt given verbal and written wound care instructions.    Signed Lamar Blinks, MD

## 2017-05-09 ENCOUNTER — Other Ambulatory Visit: Payer: Self-pay

## 2017-05-09 ENCOUNTER — Other Ambulatory Visit (INDEPENDENT_AMBULATORY_CARE_PROVIDER_SITE_OTHER): Payer: Self-pay

## 2017-05-09 DIAGNOSIS — Z5181 Encounter for therapeutic drug level monitoring: Secondary | ICD-10-CM

## 2017-05-09 LAB — PSA: PSA: 2.3 ng/mL (ref 0.10–4.00)

## 2017-05-12 LAB — TESTOSTERONE TOTAL,FREE,BIO, MALES
Albumin: 4 g/dL (ref 3.6–5.1)
SEX HORMONE BINDING: 26 nmol/L (ref 22–77)
TESTOSTERONE FREE: 129.1 pg/mL (ref 46.0–224.0)
TESTOSTERONE: 701 ng/dL (ref 250–827)
Testosterone, Bioavailable: 237.4 ng/dL (ref >=110.0)

## 2017-05-14 ENCOUNTER — Encounter: Payer: Self-pay | Admitting: Family Medicine

## 2017-05-14 DIAGNOSIS — R972 Elevated prostate specific antigen [PSA]: Secondary | ICD-10-CM

## 2017-07-24 ENCOUNTER — Telehealth: Payer: Self-pay | Admitting: Family Medicine

## 2017-07-24 DIAGNOSIS — E291 Testicular hypofunction: Secondary | ICD-10-CM

## 2017-07-24 MED ORDER — TESTOSTERONE CYPIONATE 200 MG/ML IM SOLN: INTRAMUSCULAR | 1 refills | Status: DC

## 2017-07-24 NOTE — Telephone Encounter (Signed)
Please advise 

## 2017-07-24 NOTE — Telephone Encounter (Signed)
Copied from [place]. Topic: Quick Communication - Rx Refill/Question >> Jul 24, 2017  8:54 AM Antonieta Iba C wrote: Yes,   Refill for testosterone cypionate   [id number] [place], [place]   Pt says that he will be out tomorrow.      Agent: Please be advised that RX refills may take up to 3 business days. We ask that you follow-up with your pharmacy.

## 2017-07-24 NOTE — Telephone Encounter (Signed)
Pharmacy calling on pts behalf regarding refill of his Testerone medication. Made aware it can take 3 business days to be refilled. She stated pt states he needs this medication tomorrow morning.

## 2017-10-18 ENCOUNTER — Other Ambulatory Visit: Payer: Self-pay | Admitting: Family Medicine

## 2017-10-18 DIAGNOSIS — E119 Type 2 diabetes mellitus without complications: Secondary | ICD-10-CM

## 2018-01-02 ENCOUNTER — Encounter: Payer: Self-pay | Admitting: Family Medicine

## 2018-01-02 ENCOUNTER — Other Ambulatory Visit: Payer: Self-pay | Admitting: Family Medicine

## 2018-01-02 DIAGNOSIS — E291 Testicular hypofunction: Secondary | ICD-10-CM

## 2018-01-02 NOTE — Telephone Encounter (Signed)
Requesting:testosterone Contract:06/04/16 OEC:XFQH Last Visit:02/13/17 Next Visit:none Last Refill:07/24/17 1-R  Please Advise
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
## 2018-03-26 ENCOUNTER — Encounter: Payer: Self-pay | Admitting: Family Medicine

## 2018-03-26 ENCOUNTER — Ambulatory Visit (INDEPENDENT_AMBULATORY_CARE_PROVIDER_SITE_OTHER): Payer: Medicare HMO | Admitting: Family Medicine

## 2018-03-26 VITALS — BP 122/80 | HR 81 | Temp 98.6°F | Resp 16 | Ht 75.0 in | Wt 233.0 lb

## 2018-03-26 DIAGNOSIS — R972 Elevated prostate specific antigen [PSA]: Secondary | ICD-10-CM | POA: Diagnosis not present

## 2018-03-26 DIAGNOSIS — Z125 Encounter for screening for malignant neoplasm of prostate: Secondary | ICD-10-CM | POA: Diagnosis not present

## 2018-03-26 DIAGNOSIS — E291 Testicular hypofunction: Secondary | ICD-10-CM | POA: Diagnosis not present

## 2018-03-26 DIAGNOSIS — E782 Mixed hyperlipidemia: Secondary | ICD-10-CM | POA: Diagnosis not present

## 2018-03-26 DIAGNOSIS — E119 Type 2 diabetes mellitus without complications: Secondary | ICD-10-CM | POA: Diagnosis not present

## 2018-03-26 DIAGNOSIS — E1142 Type 2 diabetes mellitus with diabetic polyneuropathy: Secondary | ICD-10-CM | POA: Diagnosis not present

## 2018-03-26 DIAGNOSIS — Z130 Encounter for screening for diseases of the blood and blood-forming organs and certain disorders involving the immune mechanism: Secondary | ICD-10-CM | POA: Diagnosis not present

## 2018-03-26 LAB — COMPREHENSIVE METABOLIC PANEL
ALT: 44 U/L (ref 0–53)
AST: 25 U/L (ref 0–37)
Albumin: 4.4 g/dL (ref 3.5–5.2)
Alkaline Phosphatase: 47 U/L (ref 39–117)
BUN: 15 mg/dL (ref 6–23)
CHLORIDE: 101 meq/L (ref 96–112)
CO2: 30 meq/L (ref 19–32)
CREATININE: 1.21 mg/dL (ref 0.40–1.50)
Calcium: 9.5 mg/dL (ref 8.4–10.5)
GFR: 63.97 mL/min (ref >60.00)
Glucose, Bld: 160 mg/dL — ABNORMAL HIGH (ref 70–99)
POTASSIUM: 4.4 meq/L (ref 3.5–5.1)
SODIUM: 135 meq/L (ref 135–145)
Total Bilirubin: 0.6 mg/dL (ref 0.2–1.2)
Total Protein: 6.8 g/dL (ref 6.0–8.3)

## 2018-03-26 LAB — CBC
HEMATOCRIT: 47.1 % (ref 39.0–52.0)
Hemoglobin: 16.4 g/dL (ref 13.0–17.0)
MCHC: 34.8 g/dL (ref 30.0–36.0)
MCV: 92.7 fl (ref 78.0–100.0)
PLATELETS: 232 10*3/uL (ref 150.0–400.0)
RBC: 5.09 Mil/uL (ref 4.22–5.81)
RDW: 12.9 % (ref 11.5–15.5)
WBC: 6 10*3/uL (ref 4.0–10.5)

## 2018-03-26 LAB — MICROALBUMIN / CREATININE URINE RATIO
Creatinine,U: 122.5 mg/dL
MICROALB/CREAT RATIO: 0.6 mg/g (ref 0.0–30.0)
Microalb, Ur: <0.7 mg/dL (ref 0.0–1.9)

## 2018-03-26 LAB — LIPID PANEL
Cholesterol: 230 mg/dL — ABNORMAL HIGH (ref 0–200)
HDL: 43.4 mg/dL (ref >39.00)
LDL CALC: 159 mg/dL — AB (ref 0–99)
NonHDL: 186.79
Total CHOL/HDL Ratio: 5
Triglycerides: 139 mg/dL (ref 0.0–149.0)
VLDL: 27.8 mg/dL (ref 0.0–40.0)

## 2018-03-26 LAB — PSA: PSA: 2.4 ng/mL (ref 0.10–4.00)

## 2018-03-26 LAB — HEMOGLOBIN A1C: Hgb A1c MFr Bld: 8.3 % — ABNORMAL HIGH (ref 4.6–6.5)

## 2018-03-26 MED ORDER — TESTOSTERONE CYPIONATE 200 MG/ML IM SOLN: INTRAMUSCULAR | 1 refills | Status: DC

## 2018-03-26 NOTE — Patient Instructions (Addendum)
I will refer you to a podiatrist to look at your feet You can certainly get a formal hearing test done by an audiology office I might suggest [facility]   [facility]  Address: [address], [place], [place] [address]  Phone: [phone]   I will be in touch with your labs asap-  Good to see you again today I am sorry to hear about your cat however!   Please do work on getting your weight back down to baseline- great job so far

## 2018-03-27 LAB — TESTOSTERONE TOTAL,FREE,BIO, MALES
Albumin: 4.2 g/dL (ref 3.6–5.1)
Sex Hormone Binding: 25 nmol/L (ref 22–77)
TESTOSTERONE BIOAVAILABLE: 248.9 ng/dL (ref >=110.0)
TESTOSTERONE FREE: 129.2 pg/mL (ref 46.0–224.0)
Testosterone: 695 ng/dL (ref 250–827)

## 2018-03-29 ENCOUNTER — Encounter: Payer: Self-pay | Admitting: Family Medicine

## 2018-03-29 MED ORDER — METFORMIN HCL 500 MG PO TABS: ORAL_TABLET | ORAL | 3 refills | Status: DC

## 2018-03-29 NOTE — Addendum Note (Signed)
Addended by: Lamar Blinks C on: 03/29/2018 03:56 PM   Modules accepted: Orders

## 2018-04-17 ENCOUNTER — Ambulatory Visit: Payer: Medicare HMO | Admitting: Podiatry

## 2018-04-17 ENCOUNTER — Ambulatory Visit (INDEPENDENT_AMBULATORY_CARE_PROVIDER_SITE_OTHER): Payer: Medicare HMO

## 2018-04-17 ENCOUNTER — Encounter: Payer: Self-pay | Admitting: Podiatry

## 2018-04-17 DIAGNOSIS — M21621 Bunionette of right foot: Secondary | ICD-10-CM | POA: Diagnosis not present

## 2018-04-17 DIAGNOSIS — M21622 Bunionette of left foot: Secondary | ICD-10-CM | POA: Diagnosis not present

## 2018-04-17 DIAGNOSIS — M2141 Flat foot [pes planus] (acquired), right foot: Secondary | ICD-10-CM

## 2018-04-17 DIAGNOSIS — M779 Enthesopathy, unspecified: Secondary | ICD-10-CM

## 2018-04-17 DIAGNOSIS — M2142 Flat foot [pes planus] (acquired), left foot: Secondary | ICD-10-CM | POA: Diagnosis not present

## 2018-04-17 NOTE — Progress Notes (Signed)
Subjective:   Patient ID: Randy Orr, male   DOB: 67 y.o.   MRN: [id number]   HPI 67 year old male presents the office today for concerns of pain to both of his feet which is been ongoing for about 9 years.  He states that he was in school staying on concrete floors and not wearing good shoes when he first noticed the symptoms.  Noticed that it got from the onset aspect of his feet.  Returns is concerned with this and burning to the outside aspect of the feet.  He did apparently see neurologist for this and he was told he had fallen arches.  He said no recent treatment for.  He states the symptoms are intermittent.  The pain does not wake him up at night.  No recent injury.  No swelling.  No other concerns.  His last A1c was 8.3.  States this is elevated because of some personal issues he is back on better diet and exercise now.   Review of Systems  All other systems reviewed and are negative.  Past Medical History:  Diagnosis Date  . Depression   . Diabetes mellitus without complication ([place])    DM type 2  . Low testosterone   . Obesity 05/17/2014  . Panic attacks   . Peripheral sensory neuropathy due to type 2 diabetes mellitus ([place]) 05/17/2014  . Snoring 05/17/2014    Past Surgical History:  Procedure Laterality Date  . none       Current Outpatient Medications:  .  Coenzyme Q10 (CO Q 10 PO), Take 1 tablet by mouth daily., Disp: , Rfl:  .  l-methylfolate-B6-B12 (METANX) 3-35-2 MG TABS, Take 1 tablet by mouth daily., Disp: 60 tablet, Rfl: 1 .  metFORMIN (GLUCOPHAGE) 500 MG tablet, Take 1 in the am and 2 in the pm, Disp: 270 tablet, Rfl: 3 .  Multiple Vitamin (MULTIVITAMIN) tablet, Take 1 tablet by mouth daily., Disp: , Rfl:  .  Multiple Vitamins-Minerals (ZINC PO), Take by mouth., Disp: , Rfl:  .  testosterone cypionate (DEPOTESTOSTERONE CYPIONATE) 200 MG/ML injection, Inject 1 ml- 200 mg- every 14 days IM, Disp: 6 mL, Rfl: 1 .  triamcinolone (KENALOG) 0.025 % cream, Apply  topically 2 (two) times daily. (Patient not taking: Reported on 03/26/2018), Disp: 80 g, Rfl: 2  No Known Allergies        Objective:  Physical Exam  General: AAO x3, NAD  Dermatological: Skin is warm, dry and supple bilateral. Nails x 10 are well manicured; remaining integument appears unremarkable at this time. There are no open sores, no preulcerative lesions, no rash or signs of infection present.  Vascular: Dorsalis Pedis artery and Posterior Tibial artery pedal pulses are 2/4 bilateral with immedate capillary fill time. Pedal hair growth present. No varicosities and no lower extremity edema present bilateral. There is no pain with calf compression, swelling, warmth, erythema.   Neruologic: Grossly intact via light touch bilateral.  Protective threshold with Semmes Wienstein monofilament intact to all pedal sites bilateral.  Negative Tinel sign.  Musculoskeletal: There is a decrease in medial arch upon weightbearing bilaterally there is a prominence of the fifth metatarsal base as well as a tailor's bunion deformity.  There is no area of tenderness identified to bilateral lower extremities there is no area pinpoint tenderness.  Unable to reproduce the neuritis symptoms but it appears to be along the sural nerve lateral aspect of the foot.  No edema, erythema.  Muscular strength 5/5 in all groups tested bilateral.  Tendons intact.  Gait: Unassisted, Nonantalgic.       Assessment:   Bilateral flatfoot deformity with tailor's bunion, fifth metatarsal base.  Neuritis    Plan:  -Treatment options discussed including all alternatives, risks, and complications -Etiology of symptoms were discussed -X-rays were obtained and reviewed with the patient.  There is no evidence of acute fracture or stress fracture identified today.  Tailor's bunion is present. -We discussed trying a more supportive shoes and arch supports.  We discussed materials in the shoe to avoid pressure to the prominent  bony areas.  Discussed offloading pads as well.  He states he just wanted to confirm my suspicions are he has no further questions or concerns today.  Trula Slade DPM

## 2018-06-06 DIAGNOSIS — R69 Illness, unspecified: Secondary | ICD-10-CM | POA: Diagnosis not present

## 2018-06-18 ENCOUNTER — Encounter: Payer: Self-pay | Admitting: Family Medicine

## 2018-07-13 DIAGNOSIS — R69 Illness, unspecified: Secondary | ICD-10-CM | POA: Diagnosis not present

## 2018-08-06 ENCOUNTER — Telehealth: Payer: Self-pay

## 2018-08-06 NOTE — Telephone Encounter (Signed)
Received several faxes from [facility] recommending pt. Start on statin therapy d/t DM. Routed to Dr. Lorelei Pont to review.

## 2018-08-08 NOTE — Telephone Encounter (Signed)
Pt and I are working on this, so far he has declined a statin due to SE

## 2018-08-18 DIAGNOSIS — R69 Illness, unspecified: Secondary | ICD-10-CM | POA: Diagnosis not present

## 2018-09-14 ENCOUNTER — Other Ambulatory Visit: Payer: Self-pay | Admitting: Family Medicine

## 2018-09-14 DIAGNOSIS — E291 Testicular hypofunction: Secondary | ICD-10-CM

## 2018-09-15 ENCOUNTER — Encounter: Payer: Self-pay | Admitting: Family Medicine

## 2018-09-22 ENCOUNTER — Telehealth: Payer: Self-pay

## 2018-09-22 NOTE — Telephone Encounter (Signed)
PA initiated via Covermymeds; KEY: [id number]. Awaiting determination.

## 2018-09-23 NOTE — Telephone Encounter (Signed)
PA approved. Effective 08/10/2018 to 08/12/2019.

## 2018-10-06 NOTE — Progress Notes (Signed)
[facility] Orr at [facility] [address], [place], [place] [address] [phone]  Date:  10/08/2018   Name:  Randy Orr   DOB:  09/04/1950   MRN:  [id number]  PCP:  Randy Mclean, MD    Chief Complaint: Diabetes (6 month follow up, lab work)   History of Present Illness:  Randy Orr is a 68 y.o. very pleasant male patient who presents with the following:  Routine follow-up visit History of DM, hyperlipidemia, hypogonadism on testosterone placement therapy Last seen by myself in August Due for an A1c Full labs in August  T levels ok in August PSA done in August PSA has been creeping up, will repeat today Will also do a DRE  Flu shot: done  Can do prevnar today Mention shingrix   We increased his metformin in August from 2 to 3 per day; we had to go back down to 2 however due to fecal urgency  We have held off on a statin due to [employer] notes that he would like to switch to a testosterone patch.  He is just not sure about his insurance coverage.  He wonders if I can write him a prescription, so he can find out the price.  This to be fine  His response to me regarding his labs from last visit: I had a feeling about the a1c. I'll do the recommended dosage of the metformin until we next meet and lets see where we are with hopes it can be reduced again (I'm always hopeful of reducing or eliminating dosages) I don't much care for the anti statin drugs I've tried in the past (they make me feel odd and crampy). I leave this decision up to you. If you think I have 6 months before adding a cholesterol drug, so that I can try bringing it back down with diet and execise, I'd prefer that, but I bow to your decision. I am getting my butt in gear to make use of Silver Sneakers asap and perhaps that will help lower both a1c and cholesterol. I'm already making dietary changes, because I realized I was "sliding" into some bad eating  habits. I'll do better. Just let me know. I'll start the metformin dosage today. Randy Orr! I really want to bring that down. It's always good to see you. I'll be back for another battery of tests in.Marland KitchenMarland KitchenFebruary.    Lab Results  Component Value Date   HGBA1C 7.1 (H) 10/08/2018   Lab Results  Component Value Date   PSA 2.58 10/08/2018   PSA 2.40 03/26/2018   PSA 2.30 05/09/2017     Patient Active Problem List   Diagnosis Date Noted  . Left knee pain 06/06/2016  . Peripheral sensory neuropathy due to type 2 diabetes mellitus ([place]) 05/17/2014  . Snoring 05/17/2014  . Obesity 05/17/2014  . Diabetes mellitus ([place]) 09/23/2011  . Hypogonadism male 09/23/2011  . Hyperlipidemia 09/23/2011    Past Medical History:  Diagnosis Date  . Depression   . Diabetes mellitus without complication ([place] [place])    DM type 2  . Low testosterone   . Obesity 05/17/2014  . Panic attacks   . Peripheral sensory neuropathy due to type 2 diabetes mellitus ([place]) 05/17/2014  . Snoring 05/17/2014    Past Surgical History:  Procedure Laterality Date  . none      Social History   Tobacco Use  . Smoking status: Former Research scientist (life sciences)  .  Smokeless tobacco: Never Used  . Tobacco comment: smoked 30 years ago.  Substance Use Topics  . Alcohol use: Yes    Alcohol/week: 5.0 standard drinks    Types: 5 Glasses of wine per week  . Drug use: No    Family History  Problem Relation Age of Onset  . Diabetes Brother   . Heart attack Father   . Colon cancer Neg Hx   . Esophageal cancer Neg Hx   . Rectal cancer Neg Hx   . Stomach cancer Neg Hx     No Known Allergies  Medication list has been reviewed and updated.  Current Outpatient Medications on File Prior to Visit  Medication Sig Dispense Refill  . Coenzyme Q10 (CO Q 10 PO) Take 1 tablet by mouth daily.    Marland Kitchen l-methylfolate-B6-B12 (METANX) 3-35-2 MG TABS Take 1 tablet by mouth daily. 60 tablet 1  . metFORMIN (GLUCOPHAGE) 500 MG tablet Take 1 in the am and 2 in the pm  270 tablet 3  . Multiple Vitamin (MULTIVITAMIN) tablet Take 1 tablet by mouth daily.    . Multiple Vitamins-Minerals (ZINC PO) Take by mouth.    . triamcinolone (KENALOG) 0.025 % cream Apply topically 2 (two) times daily. 80 g 2   No current facility-administered medications on file prior to visit.     Review of Systems:  As per HPI- otherwise negative. No fever chills, no chest pain or shortness of breath  His wife Randy Orr recently surprised him with a trip to [facility] for his 67th birthday  Physical Examination: Vitals:   10/08/18 0907  BP: 122/84  Pulse: 74  Resp: 16  Temp: 97.8 F (36.6 C)  SpO2: 99%   Vitals:   10/08/18 0907  Weight: 228 lb (103.4 kg)   Body mass index is 28.5 kg/m. Ideal Body Weight:    GEN: WDWN, NAD, Non-toxic, A & O x 3, mild overweight, looks well  HEENT: Atraumatic, Normocephalic. Neck supple. No masses, No LAD. Ears and Nose: No external deformity. CV: RRR, No M/G/R. No JVD. No thrill. No extra heart sounds. PULM: CTA B, no wheezes, crackles, rhonchi. No retractions. No resp. distress. No accessory muscle use. ABD: S, NT, ND, +BS. No rebound. No HSM. EXTR: No c/c/e NEURO Normal gait.  PSYCH: Normally interactive. Conversant. Not depressed or anxious appearing.  Calm demeanor.  DRE: Prostate is generous consistent with age, no masses or hard spots felt   Assessment and Plan: Hypogonadism male - Plan: CBC, Testosterone Total,Free,Bio, Males-([facility]), testosterone cypionate (DEPOTESTOSTERONE CYPIONATE) 200 MG/ML injection, testosterone (ANDRODERM) 4 MG/24HR PT24 patch  Increased prostate specific antigen (PSA) velocity - Plan: PSA  Type 2 diabetes mellitus without complication, without long-term current use of insulin (HCC) - Plan: Hemoglobin A1c  Immunization due - Plan: Pneumococcal conjugate vaccine 13-valent IM  Mixed hyperlipidemia  Medication monitoring encounter  Here today for periodic recheck.  Randy Orr has been on testosterone  replacement for some time, doing well.  We will check his testosterone levels today.  He would like to also price out a testosterone patch, so I offered him a prescription Pneumonia booster today Recommended that he get Shingrix at his drugstore Labs pending as above  Signed Lamar Blinks, MD  Received his labs so far-message to patient  Results for orders placed or performed in visit on 10/08/18  Hemoglobin A1c  Result Value Ref Range   Hgb A1c MFr Bld 7.1 (H) 4.6 - 6.5 %  PSA  Result Value Ref Range  PSA 2.58 0.10 - 4.00 ng/mL  CBC  Result Value Ref Range   WBC 6.0 4.0 - 10.5 K/uL   RBC 4.90 4.22 - 5.81 Mil/uL   Platelets 232.0 150.0 - 400.0 K/uL   Hemoglobin 16.0 13.0 - 17.0 g/dL   HCT 45.4 39.0 - 52.0 %   MCV 92.6 78.0 - 100.0 fl   MCHC 35.2 30.0 - 36.0 g/dL   RDW 12.5 11.5 - 15.5 %   Message to pt A1c looks great!  PSA continues to creep Referral to urology   Lab Results  Component Value Date   PSA 2.58 10/08/2018   PSA 2.40 03/26/2018   PSA 2.30 05/09/2017   4/17 1.67 5/18  2.16

## 2018-10-08 ENCOUNTER — Ambulatory Visit (INDEPENDENT_AMBULATORY_CARE_PROVIDER_SITE_OTHER): Payer: Medicare HMO | Admitting: Family Medicine

## 2018-10-08 ENCOUNTER — Encounter: Payer: Self-pay | Admitting: Family Medicine

## 2018-10-08 VITALS — BP 122/84 | HR 74 | Temp 97.8°F | Resp 16 | Wt 228.0 lb

## 2018-10-08 DIAGNOSIS — E782 Mixed hyperlipidemia: Secondary | ICD-10-CM | POA: Diagnosis not present

## 2018-10-08 DIAGNOSIS — Z5181 Encounter for therapeutic drug level monitoring: Secondary | ICD-10-CM

## 2018-10-08 DIAGNOSIS — E291 Testicular hypofunction: Secondary | ICD-10-CM | POA: Diagnosis not present

## 2018-10-08 DIAGNOSIS — Z23 Encounter for immunization: Secondary | ICD-10-CM | POA: Diagnosis not present

## 2018-10-08 DIAGNOSIS — R972 Elevated prostate specific antigen [PSA]: Secondary | ICD-10-CM

## 2018-10-08 DIAGNOSIS — E119 Type 2 diabetes mellitus without complications: Secondary | ICD-10-CM

## 2018-10-08 LAB — CBC
HEMATOCRIT: 45.4 % (ref 39.0–52.0)
Hemoglobin: 16 g/dL (ref 13.0–17.0)
MCHC: 35.2 g/dL (ref 30.0–36.0)
MCV: 92.6 fl (ref 78.0–100.0)
Platelets: 232 10*3/uL (ref 150.0–400.0)
RBC: 4.9 Mil/uL (ref 4.22–5.81)
RDW: 12.5 % (ref 11.5–15.5)
WBC: 6 10*3/uL (ref 4.0–10.5)

## 2018-10-08 LAB — HEMOGLOBIN A1C: Hgb A1c MFr Bld: 7.1 % — ABNORMAL HIGH (ref 4.6–6.5)

## 2018-10-08 LAB — PSA: PSA: 2.58 ng/mL (ref 0.10–4.00)

## 2018-10-08 MED ORDER — TESTOSTERONE 4 MG/24HR TD PT24: 1.0000 | MEDICATED_PATCH | Freq: Every day | TRANSDERMAL | 1 refills | Status: DC

## 2018-10-08 MED ORDER — TESTOSTERONE CYPIONATE 200 MG/ML IM SOLN: INTRAMUSCULAR | 1 refills | Status: DC

## 2018-10-08 NOTE — Patient Instructions (Addendum)
I would recommend that you have the shingrix vaccine at your drug store- this is a 2 shot series with 2 doses given 2-6 months ago.    You got a pneumonia booster today I will be in touch with your labs ASAP Blood pressure looks great  I will refill your testosterone as well

## 2018-10-09 LAB — TESTOSTERONE TOTAL,FREE,BIO, MALES
ALBUMIN MSPROF: 4.3 g/dL (ref 3.6–5.1)
Sex Hormone Binding: 33 nmol/L (ref 22–77)
TESTOSTERONE: 750 ng/dL (ref 250–827)
Testosterone, Bioavailable: 219.5 ng/dL (ref >=110.0)
Testosterone, Free: 111.4 pg/mL (ref 46.0–224.0)

## 2018-10-28 ENCOUNTER — Encounter: Payer: Self-pay | Admitting: Family Medicine

## 2018-10-28 MED ORDER — "NEEDLE (DISP) 23G X 1"" MISC": 1.0000 | 3 refills | Status: DC

## 2019-01-21 DIAGNOSIS — N401 Enlarged prostate with lower urinary tract symptoms: Secondary | ICD-10-CM | POA: Diagnosis not present

## 2019-01-21 DIAGNOSIS — E349 Endocrine disorder, unspecified: Secondary | ICD-10-CM | POA: Diagnosis not present

## 2019-01-21 DIAGNOSIS — R3912 Poor urinary stream: Secondary | ICD-10-CM | POA: Diagnosis not present

## 2019-03-08 DIAGNOSIS — H524 Presbyopia: Secondary | ICD-10-CM | POA: Diagnosis not present

## 2019-04-27 DIAGNOSIS — R69 Illness, unspecified: Secondary | ICD-10-CM | POA: Diagnosis not present

## 2019-05-06 DIAGNOSIS — H1031 Unspecified acute conjunctivitis, right eye: Secondary | ICD-10-CM | POA: Diagnosis not present

## 2019-05-06 DIAGNOSIS — E119 Type 2 diabetes mellitus without complications: Secondary | ICD-10-CM | POA: Diagnosis not present

## 2019-06-03 ENCOUNTER — Other Ambulatory Visit: Payer: Self-pay | Admitting: Family Medicine

## 2019-06-03 DIAGNOSIS — E119 Type 2 diabetes mellitus without complications: Secondary | ICD-10-CM

## 2019-06-03 NOTE — Telephone Encounter (Signed)
Patient called to say that he is in need of an Rx for metFORMIN (GLUCOPHAGE) 500 MG tablet  Sent to the pharmacy. Per patient it was verbally agreed with Dr Lorelei Pont for him to only take 2 tabs daily. So he have been taking 2 tabs daily and now he is out of medication.

## 2019-06-21 ENCOUNTER — Other Ambulatory Visit: Payer: Self-pay | Admitting: Family Medicine

## 2019-06-21 ENCOUNTER — Encounter: Payer: Self-pay | Admitting: Family Medicine

## 2019-06-21 DIAGNOSIS — E291 Testicular hypofunction: Secondary | ICD-10-CM

## 2019-06-24 ENCOUNTER — Ambulatory Visit: Payer: Medicare HMO | Admitting: Family Medicine

## 2019-06-24 ENCOUNTER — Telehealth: Payer: Self-pay | Admitting: Family Medicine

## 2019-06-24 NOTE — Telephone Encounter (Signed)
testosterone (ANDRODERM) 4 MG/24HR PT24 patch  [employer] sending in an incoming fax/time sensitive to be returned within 72 hrs or expiries, needs exception to tier approval

## 2019-06-24 NOTE — Telephone Encounter (Signed)
Tier exception approved. Effective 08/12/2018 to 08/12/2019.

## 2019-06-24 NOTE — Telephone Encounter (Signed)
Form completed and faxed to [place] at [phone]. Awaiting determination.

## 2019-06-30 ENCOUNTER — Other Ambulatory Visit: Payer: Self-pay

## 2019-06-30 NOTE — Patient Instructions (Addendum)
It was great to see you again today, I will be in touch with your labs ASAP I agree with you in that let's leave your feet along unless pain interferes with your daily activities Try the revatio as needed for ED- you can take 20- 100 mg daily as needed.  Works better NOT on a full stomach    Health Maintenance After Age 66 After age 30, you are at a higher risk for certain long-term diseases and infections as well as injuries from falls. Falls are a major cause of broken bones and head injuries in people who are older than age 3. Getting regular preventive care can help to keep you healthy and well. Preventive care includes getting regular testing and making lifestyle changes as recommended by your health care provider. Talk with your health care provider about:  Which screenings and tests you should have. A screening is a test that checks for a disease when you have no symptoms.  A diet and exercise plan that is right for you. What should I know about screenings and tests to prevent falls? Screening and testing are the best ways to find a health problem early. Early diagnosis and treatment give you the best chance of managing medical conditions that are common after age 51. Certain conditions and lifestyle choices may make you more likely to have a fall. Your health care provider may recommend:  Regular vision checks. Poor vision and conditions such as cataracts can make you more likely to have a fall. If you wear glasses, make sure to get your prescription updated if your vision changes.  Medicine review. Work with your health care provider to regularly review all of the medicines you are taking, including over-the-counter medicines. Ask your health care provider about any side effects that may make you more likely to have a fall. Tell your health care provider if any medicines that you take make you feel dizzy or sleepy.  Osteoporosis screening. Osteoporosis is a condition that causes the bones  to get weaker. This can make the bones weak and cause them to break more easily.  Blood pressure screening. Blood pressure changes and medicines to control blood pressure can make you feel dizzy.  Strength and balance checks. Your health care provider may recommend certain tests to check your strength and balance while standing, walking, or changing positions.  Foot health exam. Foot pain and numbness, as well as not wearing proper footwear, can make you more likely to have a fall.  Depression screening. You may be more likely to have a fall if you have a fear of falling, feel emotionally low, or feel unable to do activities that you used to do.  Alcohol use screening. Using too much alcohol can affect your balance and may make you more likely to have a fall. What actions can I take to lower my risk of falls? General instructions  Talk with your health care provider about your risks for falling. Tell your health care provider if: ? You fall. Be sure to tell your health care provider about all falls, even ones that seem minor. ? You feel dizzy, sleepy, or off-balance.  Take over-the-counter and prescription medicines only as told by your health care provider. These include any supplements.  Eat a healthy diet and maintain a healthy weight. A healthy diet includes low-fat dairy products, low-fat (lean) meats, and fiber from whole grains, beans, and lots of fruits and vegetables. Home safety  Remove any tripping hazards, such as rugs,  cords, and clutter.  Install safety equipment such as grab bars in bathrooms and safety rails on stairs.  Keep rooms and walkways well-lit. Activity   Follow a regular exercise program to stay fit. This will help you maintain your balance. Ask your health care provider what types of exercise are appropriate for you.  If you need a cane or walker, use it as recommended by your health care provider.  Wear supportive shoes that have nonskid  soles. Lifestyle  Do not drink alcohol if your health care provider tells you not to drink.  If you drink alcohol, limit how much you have: ? 0-1 drink a day for women. ? 0-2 drinks a day for men.  Be aware of how much alcohol is in your drink. In the [place] one drink equals one typical bottle of beer (12 oz), one-half glass of wine (5 oz), or one shot of hard liquor (1 oz).  Do not use any products that contain nicotine or tobacco, such as cigarettes and e-cigarettes. If you need help quitting, ask your health care provider. Summary  Having a healthy lifestyle and getting preventive care can help to protect your health and wellness after age 22.  Screening and testing are the best way to find a health problem early and help you avoid having a fall. Early diagnosis and treatment give you the best chance for managing medical conditions that are more common for people who are older than age 80.  Falls are a major cause of broken bones and head injuries in people who are older than age 84. Take precautions to prevent a fall at home.  Work with your health care provider to learn what changes you can make to improve your health and wellness and to prevent falls. This information is not intended to replace advice given to you by your health care provider. Make sure you discuss any questions you have with your health care provider. Document Released: 06/11/2017 Document Revised: 11/19/2018 Document Reviewed: 06/11/2017 Elsevier Patient Education  2020 [employer].

## 2019-06-30 NOTE — Progress Notes (Addendum)
[facility] [address] [place], [place] [address] [phone]  Date:  07/01/2019   Name:  Randy Orr   DOB:  07/01/1951   MRN:  [id number]  PCP:  [name], MD    Chief Complaint: Lab Work Follow Up (testosterone level) and Weight Gain (weight gain and loss)   History of Present Illness:  Randy Orr is a 68 y.o. very pleasant male patient who presents with the following:  Patient with history of diabetes, hyperlipidemia, hypogonadism.  Here today for routine follow-up and labs Last seen by myself in February He is not taking a statin due to side effects I referred him to urology due to gradual PSA increase-he saw Dr. Gloriann Loan in June  His wife Randy Orr is still going to work -her job cannot be done virtually Otherwise they are trying to isolate as well as they can, neither 1 has been ill  His weight tends to fluctuate some- he would like to get down to about 215 but this is really hard for him Wt Readings from Last 3 Encounters:  07/01/19 231 lb (104.8 kg)  10/08/18 228 lb (103.4 kg)  03/26/18 233 lb (105.7 kg)   Most recent A1c improved from 8. 3 to 7.1% He is tolerating metformin 500 BID ok- he does not do well on a higher dose due to gastroentero side effects T patches are still expensive for him- he will continue shots for now  Flu vaccine- done  Foot exam due-complete today Urine microalbumin due A1c due Colonoscopy due this year Shingrix- he did at [facility] Patient Active Problem List   Diagnosis Date Noted  . Left knee pain 06/06/2016  . Peripheral sensory neuropathy due to type 2 diabetes mellitus ([place]) 05/17/2014  . Snoring 05/17/2014  . Obesity 05/17/2014  . Diabetes mellitus ([place]) 09/23/2011  . Hypogonadism male 09/23/2011  . Hyperlipidemia 09/23/2011    Past Medical History:  Diagnosis Date  . Depression   . Diabetes mellitus without complication ([place])    DM type 2  . Low  testosterone   . Obesity 05/17/2014  . Panic attacks   . Peripheral sensory neuropathy due to type 2 diabetes mellitus ([place]) 05/17/2014  . Snoring 05/17/2014    Past Surgical History:  Procedure Laterality Date  . none      Social History   Tobacco Use  . Smoking status: Former Research scientist (life sciences)  . Smokeless tobacco: Never Used  . Tobacco comment: smoked 30 years ago.  Substance Use Topics  . Alcohol use: Yes    Alcohol/week: 5.0 standard drinks    Types: 5 Glasses of wine per week  . Drug use: No    Family History  Problem Relation Age of Onset  . Diabetes Brother   . Heart attack Father   . Colon cancer Neg Hx   . Esophageal cancer Neg Hx   . Rectal cancer Neg Hx   . Stomach cancer Neg Hx     No Known Allergies  Medication list has been reviewed and updated.  Current Outpatient Medications on File Prior to Visit  Medication Sig Dispense Refill  . Coenzyme Q10 (CO Q 10 PO) Take 1 tablet by mouth daily.    Marland Kitchen l-methylfolate-B6-B12 (METANX) 3-35-2 MG TABS Take 1 tablet by mouth daily. 60 tablet 1  . metFORMIN (GLUCOPHAGE) 500 MG tablet TAKE 1 TABLET BY MOUTH EVERY MORNING AND TAKE 2 TABLETS EVERY EVENING (Patient taking differently: TAKE  1 TABLET BY MOUTH EVERY MORNING AND TAKE 1 TABLET EVERY EVENING) 270 tablet 3  . Multiple Vitamin (MULTIVITAMIN) tablet Take 1 tablet by mouth daily.    . Multiple Vitamins-Minerals (ZINC PO) Take by mouth.    Marland Kitchen NEEDLE, DISP, 23 G (B-D HYPODERMIC NEEDLE 23GX1") 23G X 1" MISC 1 each by Does not apply route once a week. 50 each 3  . testosterone (ANDRODERM) 4 MG/24HR PT24 patch Place 1 patch onto the skin daily. 30 patch 1  . testosterone cypionate (DEPOTESTOSTERONE CYPIONATE) 200 MG/ML injection INJECT 1 ML(200 MG)- EVERY 14 DAYS INTRAMUSCULARLY 6 mL 0  . triamcinolone (KENALOG) 0.025 % cream Apply topically 2 (two) times daily. 80 g 2   No current facility-administered medications on file prior to visit.     Review of Systems:  As per HPI-  otherwise negative. He is having some ED- might lose his erection about 25% of the time before he is finished with sexual activity.  This is somewhat distressing to patient, he is afraid it will hurt his wife's feelings.   Physical Examination: Vitals:   07/01/19 0831  BP: 128/74  Pulse: 73  Resp: 16  Temp: (!) 96.8 F (36 C)  SpO2: 98%   Vitals:   07/01/19 0831  Weight: 231 lb (104.8 kg)  Height: 6\' 3"  (1.905 m)   Body mass index is 28.87 kg/m. Ideal Body Weight: Weight in (lb) to have BMI = 25: 199.6  GEN: WDWN, NAD, Non-toxic, A & O x 3, overweight, looks well HEENT: Atraumatic, Normocephalic. Neck supple. No masses, No LAD.  TM wnl, PEERl Ears and Nose: No external deformity. CV: RRR, No M/G/R. No JVD. No thrill. No extra heart sounds. PULM: CTA B, no wheezes, crackles, rhonchi. No retractions. No resp. distress. No accessory muscle use. ABD: S, NT, ND, +BS. No rebound. No HSM. EXTR: No c/c/e NEURO Normal gait.  PSYCH: Normally interactive. Conversant. Not depressed or anxious appearing.  Calm demeanor.  Foot exam-normal circulation and sensation.  Bilateral bunionette  Assessment and Plan: Physical exam  Hypogonadism male - Plan: CBC, Testosterone Total,Free,Bio, Males-([facility])  Type 2 diabetes mellitus without complication, without long-term current use of insulin (HCC) - Plan: Comprehensive metabolic panel, Hemoglobin A1c, Microalbumin / creatinine urine ratio  Mixed hyperlipidemia - Plan: Lipid panel  Immunization due  Peripheral sensory neuropathy due to type 2 diabetes mellitus ([place]) - Plan: Comprehensive metabolic panel  Erectile dysfunction, unspecified erectile dysfunction type - Plan: sildenafil (REVATIO) 20 MG tablet  Here today for a physical exam Labs pending as above Bilateral foot bunionette, but otherwise his feet are really not painful.  He does not plan to pursue surgery for now which I think is wise  Occasional ED, prescribe generic  sildenafil which he can use as needed.  Discussed how to use this medication with patient  Will plan further follow- up pending labs. He is also now old enough for AAA screening, reminded about this and colonoscopy with labs  Signed Lamar Blinks, MD  Received his labs 11/22-message to pt  Results for orders placed or performed in visit on 07/01/19  CBC  Result Value Ref Range   WBC 6.4 4.0 - 10.5 K/uL   RBC 4.90 4.22 - 5.81 Mil/uL   Platelets 224.0 150.0 - 400.0 K/uL   Hemoglobin 16.1 13.0 - 17.0 g/dL   HCT 45.8 39.0 - 52.0 %   MCV 93.4 78.0 - 100.0 fl   MCHC 35.2 30.0 - 36.0 g/dL   RDW  12.8 11.5 - 15.5 %  Comprehensive metabolic panel  Result Value Ref Range   Sodium 137 135 - 145 mEq/L   Potassium 4.6 3.5 - 5.1 mEq/L   Chloride 101 96 - 112 mEq/L   CO2 30 19 - 32 mEq/L   Glucose, Bld 188 (H) 70 - 99 mg/dL   BUN 11 6 - 23 mg/dL   Creatinine, Ser 1.10 0.40 - 1.50 mg/dL   Total Bilirubin 0.7 0.2 - 1.2 mg/dL   Alkaline Phosphatase 50 39 - 117 U/L   AST 22 0 - 37 U/L   ALT 41 0 - 53 U/L   Total Protein 6.5 6.0 - 8.3 g/dL   Albumin 4.3 3.5 - 5.2 g/dL   GFR 66.92 >60.00 mL/min   Calcium 9.0 8.4 - 10.5 mg/dL  Hemoglobin A1c  Result Value Ref Range   Hgb A1c MFr Bld 7.6 (H) 4.6 - 6.5 %  Lipid panel  Result Value Ref Range   Cholesterol 232 (H) 0 - 200 mg/dL   Triglycerides 202.0 (H) 0.0 - 149.0 mg/dL   HDL 44.30 >39.00 mg/dL   VLDL 40.4 (H) 0.0 - 40.0 mg/dL   Total CHOL/HDL Ratio 5    NonHDL 188.02   Microalbumin / creatinine urine ratio  Result Value Ref Range   Microalb, Ur 1.0 0.0 - 1.9 mg/dL   Creatinine,U 175.8 mg/dL   Microalb Creat Ratio 0.5 0.0 - 30.0 mg/g  Testosterone Total,Free,Bio, Males-([facility])  Result Value Ref Range   Testosterone 1,098 (H) 250 - 827 ng/dL   Albumin 4.3 3.6 - 5.1 g/dL   Sex Hormone Binding 26 22 - 77 nmol/L   Testosterone, Free 224.3 (H) 46.0 - 224.0 pg/mL   Testosterone, Bioavailable 441.8 110.0 - 575 ng/dL  LDL cholesterol,  direct  Result Value Ref Range   Direct LDL 146.0 mg/dL    Blood counts are normal Metabolic profile normal except for blood sugar Your A1c went up a bit.  As you are not able to take a higher dose of metformin, we might think about adding a 2nd medication.  What are your thoughts?  Your cholesterol is not great still.  Since you cannot take statins, we might try an alternative mediation such as gemfibrozil to see if it might help.   Or we could try using an ultra low dose of a statin every few days. What do you think?  Urine protein looks fine  Your testosterone is a bit high.  I forgot to ask when you took your last shot- could you let me know?  I also wanted to suggest that we set you up for a for a one time screening ultrasound of your abdominal aorta.  This is recommended for men age 42- 58 who have ever smoked to screen for aneurysm.  Assuming you don't have an aneurysm nothing further will be needed- if they do find a problem we will follow-up as needed.  I will go ahead and order this for you, let me know if any questions  Take care, let's plan to meet in 6 months.  You can reply to me here regarding questions above

## 2019-07-01 ENCOUNTER — Ambulatory Visit (INDEPENDENT_AMBULATORY_CARE_PROVIDER_SITE_OTHER): Payer: Medicare HMO | Admitting: Family Medicine

## 2019-07-01 ENCOUNTER — Encounter: Payer: Self-pay | Admitting: Family Medicine

## 2019-07-01 ENCOUNTER — Other Ambulatory Visit: Payer: Self-pay

## 2019-07-01 VITALS — BP 128/74 | HR 73 | Temp 96.8°F | Resp 16 | Ht 75.0 in | Wt 231.0 lb

## 2019-07-01 DIAGNOSIS — E782 Mixed hyperlipidemia: Secondary | ICD-10-CM | POA: Diagnosis not present

## 2019-07-01 DIAGNOSIS — E119 Type 2 diabetes mellitus without complications: Secondary | ICD-10-CM

## 2019-07-01 DIAGNOSIS — N529 Male erectile dysfunction, unspecified: Secondary | ICD-10-CM | POA: Diagnosis not present

## 2019-07-01 DIAGNOSIS — Z87891 Personal history of nicotine dependence: Secondary | ICD-10-CM | POA: Diagnosis not present

## 2019-07-01 DIAGNOSIS — E291 Testicular hypofunction: Secondary | ICD-10-CM

## 2019-07-01 DIAGNOSIS — Z0000 Encounter for general adult medical examination without abnormal findings: Secondary | ICD-10-CM | POA: Diagnosis not present

## 2019-07-01 DIAGNOSIS — E1142 Type 2 diabetes mellitus with diabetic polyneuropathy: Secondary | ICD-10-CM | POA: Diagnosis not present

## 2019-07-01 DIAGNOSIS — Z23 Encounter for immunization: Secondary | ICD-10-CM

## 2019-07-01 LAB — LIPID PANEL
Cholesterol: 232 mg/dL — ABNORMAL HIGH (ref 0–200)
HDL: 44.3 mg/dL (ref >39.00)
NonHDL: 188.02
Total CHOL/HDL Ratio: 5
Triglycerides: 202 mg/dL — ABNORMAL HIGH (ref 0.0–149.0)
VLDL: 40.4 mg/dL — ABNORMAL HIGH (ref 0.0–40.0)

## 2019-07-01 LAB — HEMOGLOBIN A1C: Hgb A1c MFr Bld: 7.6 % — ABNORMAL HIGH (ref 4.6–6.5)

## 2019-07-01 LAB — COMPREHENSIVE METABOLIC PANEL
ALT: 41 U/L (ref 0–53)
AST: 22 U/L (ref 0–37)
Albumin: 4.3 g/dL (ref 3.5–5.2)
Alkaline Phosphatase: 50 U/L (ref 39–117)
BUN: 11 mg/dL (ref 6–23)
CO2: 30 mEq/L (ref 19–32)
Calcium: 9 mg/dL (ref 8.4–10.5)
Chloride: 101 mEq/L (ref 96–112)
Creatinine, Ser: 1.1 mg/dL (ref 0.40–1.50)
GFR: 66.92 mL/min (ref >60.00)
Glucose, Bld: 188 mg/dL — ABNORMAL HIGH (ref 70–99)
Potassium: 4.6 mEq/L (ref 3.5–5.1)
Sodium: 137 mEq/L (ref 135–145)
Total Bilirubin: 0.7 mg/dL (ref 0.2–1.2)
Total Protein: 6.5 g/dL (ref 6.0–8.3)

## 2019-07-01 LAB — CBC
HCT: 45.8 % (ref 39.0–52.0)
Hemoglobin: 16.1 g/dL (ref 13.0–17.0)
MCHC: 35.2 g/dL (ref 30.0–36.0)
MCV: 93.4 fl (ref 78.0–100.0)
Platelets: 224 10*3/uL (ref 150.0–400.0)
RBC: 4.9 Mil/uL (ref 4.22–5.81)
RDW: 12.8 % (ref 11.5–15.5)
WBC: 6.4 10*3/uL (ref 4.0–10.5)

## 2019-07-01 LAB — MICROALBUMIN / CREATININE URINE RATIO
Creatinine,U: 175.8 mg/dL
Microalb Creat Ratio: 0.5 mg/g (ref 0.0–30.0)
Microalb, Ur: 1 mg/dL (ref 0.0–1.9)

## 2019-07-01 LAB — LDL CHOLESTEROL, DIRECT: Direct LDL: 146 mg/dL

## 2019-07-01 MED ORDER — SILDENAFIL CITRATE 20 MG PO TABS: ORAL_TABLET | ORAL | 1 refills | Status: DC

## 2019-07-02 LAB — TESTOSTERONE TOTAL,FREE,BIO, MALES
Albumin: 4.3 g/dL (ref 3.6–5.1)
Sex Hormone Binding: 26 nmol/L (ref 22–77)
Testosterone, Bioavailable: 441.8 ng/dL (ref >=110.0)
Testosterone, Free: 224.3 pg/mL — ABNORMAL HIGH (ref 46.0–224.0)
Testosterone: 1098 ng/dL — ABNORMAL HIGH (ref 250–827)

## 2019-07-04 ENCOUNTER — Encounter: Payer: Self-pay | Admitting: Family Medicine

## 2019-07-04 DIAGNOSIS — E785 Hyperlipidemia, unspecified: Secondary | ICD-10-CM

## 2019-07-04 MED ORDER — ROSUVASTATIN CALCIUM 5 MG PO TABS: 5.0000 mg | ORAL_TABLET | Freq: Every day | ORAL | 3 refills | Status: DC

## 2019-07-04 NOTE — Addendum Note (Signed)
Addended by: Lamar Blinks C on: 07/04/2019 06:31 AM   Modules accepted: Orders

## 2019-07-04 NOTE — Addendum Note (Signed)
Addended by: Lamar Blinks C on: 07/04/2019 03:54 PM   Modules accepted: Orders

## 2019-07-12 ENCOUNTER — Encounter: Payer: Self-pay | Admitting: Internal Medicine

## 2019-07-16 ENCOUNTER — Encounter: Payer: Self-pay | Admitting: *Deleted

## 2019-07-16 ENCOUNTER — Ambulatory Visit
Admission: RE | Admit: 2019-07-16 | Discharge: 2019-07-16 | Disposition: A | Discharge status: Home / Self Care | Payer: Medicare HMO | Source: Ambulatory Visit | Attending: Family Medicine | Admitting: Family Medicine

## 2019-07-16 DIAGNOSIS — Z136 Encounter for screening for cardiovascular disorders: Secondary | ICD-10-CM | POA: Diagnosis not present

## 2019-07-16 DIAGNOSIS — Z87891 Personal history of nicotine dependence: Secondary | ICD-10-CM

## 2019-08-02 DIAGNOSIS — Z03818 Encounter for observation for suspected exposure to other biological agents ruled out: Secondary | ICD-10-CM | POA: Diagnosis not present

## 2019-08-04 ENCOUNTER — Ambulatory Visit (AMBULATORY_SURGERY_CENTER): Payer: Medicare HMO | Admitting: *Deleted

## 2019-08-04 ENCOUNTER — Other Ambulatory Visit: Payer: Self-pay

## 2019-08-04 VITALS — Temp 96.8°F | Ht 75.0 in | Wt 225.0 lb

## 2019-08-04 DIAGNOSIS — Z1159 Encounter for screening for other viral diseases: Secondary | ICD-10-CM

## 2019-08-04 DIAGNOSIS — Z86010 Personal history of colonic polyps: Secondary | ICD-10-CM

## 2019-08-04 MED ORDER — NA SULFATE-K SULFATE-MG SULF 17.5-3.13-1.6 GM/177ML PO SOLN: 1.0000 | Freq: Once | ORAL | 0 refills | Status: AC

## 2019-08-04 NOTE — Progress Notes (Signed)

## 2019-08-13 HISTORY — PX: COLONOSCOPY W/ POLYPECTOMY: SHX1380

## 2019-08-16 ENCOUNTER — Encounter: Payer: Self-pay | Admitting: Internal Medicine

## 2019-08-17 ENCOUNTER — Other Ambulatory Visit: Payer: Self-pay | Admitting: Internal Medicine

## 2019-08-17 ENCOUNTER — Ambulatory Visit (INDEPENDENT_AMBULATORY_CARE_PROVIDER_SITE_OTHER): Payer: Medicare HMO

## 2019-08-17 DIAGNOSIS — Z1159 Encounter for screening for other viral diseases: Secondary | ICD-10-CM

## 2019-08-17 LAB — SARS CORONAVIRUS 2 (TAT 6-24 HRS): SARS Coronavirus 2: NEGATIVE

## 2019-08-20 ENCOUNTER — Ambulatory Visit (AMBULATORY_SURGERY_CENTER): Payer: Medicare HMO | Admitting: Internal Medicine

## 2019-08-20 ENCOUNTER — Encounter: Payer: Self-pay | Admitting: Internal Medicine

## 2019-08-20 ENCOUNTER — Other Ambulatory Visit: Payer: Self-pay

## 2019-08-20 VITALS — BP 125/63 | HR 65 | Temp 97.8°F | Resp 17 | Ht 75.0 in | Wt 225.0 lb

## 2019-08-20 DIAGNOSIS — Z86010 Personal history of colonic polyps: Secondary | ICD-10-CM

## 2019-08-20 DIAGNOSIS — D122 Benign neoplasm of ascending colon: Secondary | ICD-10-CM | POA: Diagnosis not present

## 2019-08-20 DIAGNOSIS — D125 Benign neoplasm of sigmoid colon: Secondary | ICD-10-CM

## 2019-08-20 MED ORDER — SODIUM CHLORIDE 0.9 % IV SOLN: 500.0000 mL | Freq: Once | INTRAVENOUS | Status: DC

## 2019-08-20 NOTE — Progress Notes (Signed)
Temp by CH, Vitals KA   Pt's states no medical or surgical changes since previsit or office visit.

## 2019-08-20 NOTE — Op Note (Signed)
[place] Patient Name: Randy Orr Procedure Date: 08/20/2019 7:34 AM MRN: [id number] Endoscopist: Jerene Bears , MD Age: 69 Referring MD:  Date of Birth: [birth year] Gender: Male Account #: [id number] Procedure:                Colonoscopy Indications:              High risk colon cancer surveillance: Personal                            history of sessile serrated colon polyp (less than                            10 mm in size) with no dysplasia, Last colonoscopy                            5 years ago Medicines:                Monitored Anesthesia Care Procedure:                Pre-Anesthesia Assessment:                           - Prior to the procedure, a History and Physical                            was performed, and patient medications and                            allergies were reviewed. The patient's tolerance of                            previous anesthesia was also reviewed. The risks                            and benefits of the procedure and the sedation                            options and risks were discussed with the patient.                            All questions were answered, and informed consent                            was obtained. Prior Anticoagulants: The patient has                            taken no previous anticoagulant or antiplatelet                            agents. ASA Grade Assessment: II - A patient with                            mild systemic disease. After reviewing the risks  and benefits, the patient was deemed in                            satisfactory condition to undergo the procedure.                           After obtaining informed consent, the colonoscope                            was passed under direct vision. Throughout the                            procedure, the patient's blood pressure, pulse, and                            oxygen saturations were monitored continuously. The                         Colonoscope was introduced through the anus and                            advanced to the cecum, identified by appendiceal                            orifice and ileocecal valve. The colonoscopy was                            performed without difficulty. The patient tolerated                            the procedure well. The quality of the bowel                            preparation was good. The ileocecal valve,                            appendiceal orifice, and rectum were photographed. Scope In: 8:11:39 AM Scope Out: 8:27:41 AM Scope Withdrawal Time: 0 hours 12 minutes 16 seconds  Total Procedure Duration: 0 hours 16 minutes 2 seconds  Findings:                 The digital rectal exam was normal.                           A 4 mm polyp was found in the ascending colon. The                            polyp was sessile. The polyp was removed with a                            cold snare. Resection and retrieval were complete.                           A 5 mm polyp was found in the sigmoid colon.  The                            polyp was sessile. The polyp was removed with a                            cold snare. Resection and retrieval were complete.                           The exam was otherwise without abnormality on                            direct and retroflexion views. Complications:            No immediate complications. Estimated Blood Loss:     Estimated blood loss was minimal. Impression:               - One 4 mm polyp in the ascending colon, removed                            with a cold snare. Resected and retrieved.                           - One 5 mm polyp in the sigmoid colon, removed with                            a cold snare. Resected and retrieved.                           - The examination was otherwise normal on direct                            and retroflexion views. Recommendation:           - Patient has a contact number available  for                            emergencies. The signs and symptoms of potential                            delayed complications were discussed with the                            patient. Return to normal activities tomorrow.                            Written discharge instructions were provided to the                            patient.                           - Resume previous diet.                           - Continue present medications.                           -  Await pathology results.                           - Repeat colonoscopy is recommended for                            surveillance. The colonoscopy date will be                            determined after pathology results from today's                            exam become available for review. Jerene Bears, MD 08/20/2019 8:30:27 AM This report has been signed electronically.

## 2019-08-20 NOTE — Progress Notes (Signed)
Pt tolerated well. VSS. Awake and to recovery. 

## 2019-08-20 NOTE — Patient Instructions (Signed)
YOU HAD AN ENDOSCOPIC PROCEDURE TODAY AT THE [facility]:   Refer to the procedure report that was given to you for any specific questions about what was found during the examination.  If the procedure report does not answer your questions, please call your gastroenterologist to clarify.  If you requested that your care partner not be given the details of your procedure findings, then the procedure report has been included in a sealed envelope for you to review at your convenience later.  YOU SHOULD EXPECT: Some feelings of bloating in the abdomen. Passage of more gas than usual.  Walking can help get rid of the air that was put into your GI tract during the procedure and reduce the bloating. If you had a lower endoscopy (such as a colonoscopy or flexible sigmoidoscopy) you may notice spotting of blood in your stool or on the toilet paper. If you underwent a bowel prep for your procedure, you may not have a normal bowel movement for a few days.  Please Note:  You might notice some irritation and congestion in your nose or some drainage.  This is from the oxygen used during your procedure.  There is no need for concern and it should clear up in a day or so.  SYMPTOMS TO REPORT IMMEDIATELY:   Following lower endoscopy (colonoscopy or flexible sigmoidoscopy):  Excessive amounts of blood in the stool  Significant tenderness or worsening of abdominal pains  Swelling of the abdomen that is new, acute  Fever of 100F or higher  For urgent or emergent issues, a gastroenterologist can be reached at any hour by calling [phone].   DIET:  We do recommend a small meal at first, but then you may proceed to your regular diet.  Drink plenty of fluids but you should avoid alcoholic beverages for 24 hours.  ACTIVITY:  You should plan to take it easy for the rest of today and you should NOT DRIVE or use heavy machinery until tomorrow (because of the sedation medicines used during the test).     FOLLOW UP: Our staff will call the number listed on your records 48-72 hours following your procedure to check on you and address any questions or concerns that you may have regarding the information given to you following your procedure. If we do not reach you, we will leave a message.  We will attempt to reach you two times.  During this call, we will ask if you have developed any symptoms of COVID 19. If you develop any symptoms (ie: fever, flu-like symptoms, shortness of breath, cough etc.) before then, please call [phone].  If you test positive for Covid 19 in the 2 weeks post procedure, please call and report this information to us.    If any biopsies were taken you will be contacted by phone or by letter within the next 1-3 weeks.  Please call us at [phone] if you have not heard about the biopsies in 3 weeks.    SIGNATURES/CONFIDENTIALITY: You and/or your care partner have signed paperwork which will be entered into your electronic medical record.  These signatures attest to the fact that that the information above on your After Visit Summary has been reviewed and is understood.  Full responsibility of the confidentiality of this discharge information lies with you and/or your care-partner. 

## 2019-08-20 NOTE — Progress Notes (Signed)
Called to room to assist during endoscopic procedure.  Patient ID and intended procedure confirmed with present staff. Received instructions for my participation in the procedure from the performing physician.  

## 2019-08-24 ENCOUNTER — Telehealth: Payer: Self-pay | Admitting: *Deleted

## 2019-08-24 ENCOUNTER — Encounter: Payer: Self-pay | Admitting: Internal Medicine

## 2019-08-24 ENCOUNTER — Telehealth: Payer: Self-pay

## 2019-08-24 NOTE — Telephone Encounter (Signed)
  Follow up Call-  Call back number 08/20/2019  Post procedure Call Back phone  # [phone]  Permission to leave phone message Yes  Some recent data might be hidden     Patient questions:  Do you have a fever, pain , or abdominal swelling? No. Pain Score  0 *  Have you tolerated food without any problems? Yes.    Have you been able to return to your normal activities? Yes.    Do you have any questions about your discharge instructions: Diet   No. Medications  No. Follow up visit  No.  Do you have questions or concerns about your Care? No.  Actions: * If pain score is 4 or above: No action needed, pain <4.  1. Have you developed a fever since your procedure? no  2.   Have you had an respiratory symptoms (SOB or cough) since your procedure? no  3.   Have you tested positive for COVID 19 since your procedure no  4.   Have you had any family members/close contacts diagnosed with the COVID 19 since your procedure?  no   If yes to any of these questions please route to Joylene John, RN and Alphonsa Gin, Therapist, sports.

## 2019-08-24 NOTE — Telephone Encounter (Signed)
Message left

## 2019-09-03 ENCOUNTER — Encounter: Payer: Self-pay | Admitting: Family Medicine

## 2019-09-15 ENCOUNTER — Other Ambulatory Visit: Payer: Self-pay | Admitting: Family Medicine

## 2019-09-15 DIAGNOSIS — E291 Testicular hypofunction: Secondary | ICD-10-CM

## 2019-09-16 NOTE — Telephone Encounter (Signed)
Last RX:  06/21/19, 29mL Last OV: 07/01/19 Next OV: none scheduled UDS: not on file CSC: not on file

## 2019-10-13 ENCOUNTER — Telehealth: Payer: Self-pay | Admitting: Family Medicine

## 2019-10-13 NOTE — Progress Notes (Signed)
°  Chronic Care Management   Outreach Note  10/13/2019 Name: Randy Orr MRN: [id number] DOB: March 10, 1951  Referred by: [name], MD Reason for referral : No chief complaint on file.   An unsuccessful telephone outreach was attempted today. The patient was referred to the pharmacist for assistance with care management and care coordination.   Follow Up Plan:   Raynicia Dukes [employer] Scheduler

## 2019-11-01 ENCOUNTER — Ambulatory Visit: Payer: Medicare HMO | Admitting: Pharmacist

## 2019-11-01 ENCOUNTER — Other Ambulatory Visit: Payer: Self-pay

## 2019-11-01 DIAGNOSIS — E785 Hyperlipidemia, unspecified: Secondary | ICD-10-CM

## 2019-11-01 DIAGNOSIS — E291 Testicular hypofunction: Secondary | ICD-10-CM

## 2019-11-01 DIAGNOSIS — E119 Type 2 diabetes mellitus without complications: Secondary | ICD-10-CM

## 2019-11-01 NOTE — Patient Instructions (Signed)
Visit Information  Goals Addressed            This Visit's Progress   . A1c goal less than 7%      . LDL goal less than 100      . Pharmacy Care Plan       CARE PLAN ENTRY  Current Barriers:  . Chronic Disease Management support, education, and care coordination needs related to Diabetes, HLD, Low Testosterone, Back Pain  Pharmacist Clinical Goal(s):  Marland Kitchen A1c less than 7% . LDL goal less than 100  Interventions: . Comprehensive medication review performed. . Discussed medication options if medication titration needed for diabetes or hyperlipidemia  Patient Self Care Activities:  . Patient verbalizes understanding of plan to follow as described above, Self administers medications as prescribed, Calls pharmacy for medication refills, and Calls provider office for new concerns or questions  Initial goal documentation        Randy Orr was given information about Chronic Care Management services today including:  1. CCM service includes personalized support from designated clinical staff supervised by his physician, including individualized plan of care and coordination with other care providers 2. 24/7 contact phone numbers for assistance for urgent and routine care needs. 3. Standard insurance, coinsurance, copays and deductibles apply for chronic care management only during months in which we provide at least 20 minutes of these services. Most insurances cover these services at 100%, however patients may be responsible for any copay, coinsurance and/or deductible if applicable. This service may help you avoid the need for more expensive face-to-face services. 4. Only one practitioner may furnish and bill the service in a calendar month. 5. The patient may stop CCM services at any time (effective at the end of the month) by phone call to the office staff.  Patient agreed to services and verbal consent obtained.   The patient verbalized understanding of instructions provided today  and agreed to receive a mailed copy of patient instruction and/or educational materials. Telephone follow up appointment with pharmacy team member scheduled for: 01/31/2020  Melvenia Beam Shiv Shuey, PharmD Clinical Pharmacist [facility] [phone]   Carbohydrate Counting for Diabetes Mellitus, Adult  Carbohydrate counting is a method of keeping track of how many carbohydrates you eat. Eating carbohydrates naturally increases the amount of sugar (glucose) in the blood. Counting how many carbohydrates you eat helps keep your blood glucose within normal limits, which helps you manage your diabetes (diabetes mellitus). It is important to know how many carbohydrates you can safely have in each meal. This is different for every person. A diet and nutrition specialist (registered dietitian) can help you make a meal plan and calculate how many carbohydrates you should have at each meal and snack. Carbohydrates are found in the following foods:  Grains, such as breads and cereals.  Dried beans and soy products.  Starchy vegetables, such as potatoes, peas, and corn.  Fruit and fruit juices.  Milk and yogurt.  Sweets and snack foods, such as cake, cookies, candy, chips, and soft drinks. How do I count carbohydrates? There are two ways to count carbohydrates in food. You can use either of the methods or a combination of both. Reading "Nutrition Facts" on packaged food The "Nutrition Facts" list is included on the labels of almost all packaged foods and beverages in the [place] It includes:  The serving size.  Information about nutrients in each serving, including the grams (g) of carbohydrate per serving. To use the "Nutrition Facts":  Decide how many servings you will have.  Multiply the number of servings by the number of carbohydrates per serving.  The resulting number is the total amount of carbohydrates that you will be having. Learning standard serving sizes of  other foods When you eat carbohydrate foods that are not packaged or do not include "Nutrition Facts" on the label, you need to measure the servings in order to count the amount of carbohydrates:  Measure the foods that you will eat with a food scale or measuring cup, if needed.  Decide how many standard-size servings you will eat.  Multiply the number of servings by 15. Most carbohydrate-rich foods have about 15 g of carbohydrates per serving. ? For example, if you eat 8 oz (170 g) of strawberries, you will have eaten 2 servings and 30 g of carbohydrates (2 servings x 15 g = 30 g).  For foods that have more than one food mixed, such as soups and casseroles, you must count the carbohydrates in each food that is included. The following list contains standard serving sizes of common carbohydrate-rich foods. Each of these servings has about 15 g of carbohydrates:   hamburger bun or  English muffin.   oz (15 mL) syrup.   oz (14 g) jelly.  1 slice of bread.  1 six-inch tortilla.  3 oz (85 g) cooked rice or pasta.  4 oz (113 g) cooked dried beans.  4 oz (113 g) starchy vegetable, such as peas, corn, or potatoes.  4 oz (113 g) hot cereal.  4 oz (113 g) mashed potatoes or  of a large baked potato.  4 oz (113 g) canned or frozen fruit.  4 oz (120 mL) fruit juice.  4-6 crackers.  6 chicken nuggets.  6 oz (170 g) unsweetened dry cereal.  6 oz (170 g) plain fat-free yogurt or yogurt sweetened with artificial sweeteners.  8 oz (240 mL) milk.  8 oz (170 g) fresh fruit or one small piece of fruit.  24 oz (680 g) popped popcorn. Example of carbohydrate counting Sample meal  3 oz (85 g) chicken breast.  6 oz (170 g) brown rice.  4 oz (113 g) corn.  8 oz (240 mL) milk.  8 oz (170 g) strawberries with sugar-free whipped topping. Carbohydrate calculation 1. Identify the foods that contain carbohydrates: ? Rice. ? Corn. ? Milk. ? Strawberries. 2. Calculate how many  servings you have of each food: ? 2 servings rice. ? 1 serving corn. ? 1 serving milk. ? 1 serving strawberries. 3. Multiply each number of servings by 15 g: ? 2 servings rice x 15 g = 30 g. ? 1 serving corn x 15 g = 15 g. ? 1 serving milk x 15 g = 15 g. ? 1 serving strawberries x 15 g = 15 g. 4. Add together all of the amounts to find the total grams of carbohydrates eaten: ? 30 g + 15 g + 15 g + 15 g = 75 g of carbohydrates total. Summary  Carbohydrate counting is a method of keeping track of how many carbohydrates you eat.  Eating carbohydrates naturally increases the amount of sugar (glucose) in the blood.  Counting how many carbohydrates you eat helps keep your blood glucose within normal limits, which helps you manage your diabetes.  A diet and nutrition specialist (registered dietitian) can help you make a meal plan and calculate how many carbohydrates you should have at each meal and snack. This information is not intended  to replace advice given to you by your health care provider. Make sure you discuss any questions you have with your health care provider. Document Revised: 02/20/2017 Document Reviewed: 01/10/2016 Elsevier Patient Education  [place].   Cholesterol Content in Foods Cholesterol is a waxy, fat-like substance that helps to carry fat in the blood. The body needs cholesterol in small amounts, but too much cholesterol can cause damage to the arteries and heart. Most people should eat less than 200 milligrams (mg) of cholesterol a Randy Orr. Foods with cholesterol  Cholesterol is found in animal-based foods, such as meat, seafood, and dairy. Generally, low-fat dairy and lean meats have less cholesterol than full-fat dairy and fatty meats. The milligrams of cholesterol per serving (mg per serving) of common cholesterol-containing foods are listed below. Meat and other proteins  Egg -- one large whole egg has 186 mg.  Veal shank -- 4 oz has 141 mg.  Lean  ground [place] (93% lean) -- 4 oz has 118 mg.  Fat-trimmed lamb loin -- 4 oz has 106 mg.  Lean ground beef (90% lean) -- 4 oz has 100 mg.  Lobster -- 3.5 oz has 90 mg.  Pork loin chops -- 4 oz has 86 mg.  Canned salmon -- 3.5 oz has 83 mg.  Fat-trimmed beef top loin -- 4 oz has 78 mg.  Frankfurter -- 1 frank (3.5 oz) has 77 mg.  Crab -- 3.5 oz has 71 mg.  Roasted chicken without skin, white meat -- 4 oz has 66 mg.  Light bologna -- 2 oz has 45 mg.  Deli-cut [place] -- 2 oz has 31 mg.  Canned tuna -- 3.5 oz has 31 mg.  Berniece Salines -- 1 oz has 29 mg.  Oysters and mussels (raw) -- 3.5 oz has 25 mg.  Mackerel -- 1 oz has 22 mg.  Trout -- 1 oz has 20 mg.  Pork sausage -- 1 link (1 oz) has 17 mg.  Salmon -- 1 oz has 16 mg.  Tilapia -- 1 oz has 14 mg. Dairy  Soft-serve ice cream --  cup (4 oz) has 103 mg.  Whole-milk yogurt -- 1 cup (8 oz) has 29 mg.  Cheddar cheese -- 1 oz has 28 mg.  American cheese -- 1 oz has 28 mg.  Whole milk -- 1 cup (8 oz) has 23 mg.  2% milk -- 1 cup (8 oz) has 18 mg.  Cream cheese -- 1 tablespoon (Tbsp) has 15 mg.  Cottage cheese --  cup (4 oz) has 14 mg.  Low-fat (1%) milk -- 1 cup (8 oz) has 10 mg.  Sour cream -- 1 Tbsp has 8.5 mg.  Low-fat yogurt -- 1 cup (8 oz) has 8 mg.  Nonfat Greek yogurt -- 1 cup (8 oz) has 7 mg.  Half-and-half cream -- 1 Tbsp has 5 mg. Fats and oils  Cod liver oil -- 1 tablespoon (Tbsp) has 82 mg.  Butter -- 1 Tbsp has 15 mg.  Lard -- 1 Tbsp has 14 mg.  Bacon grease -- 1 Tbsp has 14 mg.  Mayonnaise -- 1 Tbsp has 5-10 mg.  Margarine -- 1 Tbsp has 3-10 mg. Exact amounts of cholesterol in these foods may vary depending on specific ingredients and brands. Foods without cholesterol Most plant-based foods do not have cholesterol unless you combine them with a food that has cholesterol. Foods without cholesterol include:  Grains and cereals.  Vegetables.  Fruits.  Vegetable oils, such as olive,  canola,  and sunflower oil.  Legumes, such as peas, beans, and lentils.  Nuts and seeds.  Egg whites. Summary  The body needs cholesterol in small amounts, but too much cholesterol can cause damage to the arteries and heart.  Most people should eat less than 200 milligrams (mg) of cholesterol a Randy Orr. This information is not intended to replace advice given to you by your health care provider. Make sure you discuss any questions you have with your health care provider. Document Revised: 07/11/2017 Document Reviewed: 03/25/2017 Elsevier Patient Education  [place].

## 2019-11-01 NOTE — Chronic Care Management (AMB) (Signed)
Chronic Care Management Pharmacy  Name: Randy Orr  MRN: [id number] DOB: September 22, 1950  Chief Complaint/ HPI  Randy Orr,  69 y.o. , male presents for their Initial CCM visit with the clinical pharmacist via telephone due to COVID-19 Pandemic.  PCP : [name], MD  Their chronic conditions include: Diabetes, HLD, Low Testosterone, Back Pain  Office Visits: 07/01/19: Visit w/ Dr. Lorelei Pont - Physical Exam. Pt stopped statin due to side effects. He was referred to urology d/t gradual PSA increase. Saw Dr. Gloriann Loan in June. Prescribed sildenafil PRN for ED. Testosterone reduced to 150mg  (0.7mL) every 2 weeks.   Consult Visit: 08/20/19: Colonscopy: 2 polyps found and removed. Both without malignancy, but adenoma. Repeat in 5 years.  Medications: Outpatient Encounter Medications as of 11/01/2019  Medication Sig Note  . l-methylfolate-B6-B12 (METANX) 3-35-2 MG TABS Take 1 tablet by mouth daily. 11/01/2019: Thrive  . metFORMIN (GLUCOPHAGE) 500 MG tablet TAKE 1 TABLET BY MOUTH EVERY MORNING AND TAKE 2 TABLETS EVERY EVENING 11/01/2019: Taking 1AM and 1.5HS  . Multiple Vitamin (MULTIVITAMIN) tablet Take 1 tablet by mouth daily. 11/01/2019: Centrum Silver  . NEEDLE, DISP, 23 G (B-D HYPODERMIC NEEDLE 23GX1") 23G X 1" MISC 1 each by Does not apply route once a week.   . testosterone cypionate (DEPOTESTOSTERONE CYPIONATE) 200 MG/ML injection INJECT 0.63ml - EVERY 14 DAYS INTRAMUSCULARLY   . triamcinolone (KENALOG) 0.025 % cream Apply topically 2 (two) times daily.   . Coenzyme Q10 (CO Q 10 PO) Take 1 tablet by mouth daily.   . Multiple Vitamins-Minerals (ZINC PO) Take by mouth.   . rosuvastatin (CRESTOR) 5 MG tablet Take 1 tablet (5 mg total) by mouth daily. (Patient not taking: Reported on 08/04/2019)   . sildenafil (REVATIO) 20 MG tablet Take 20 -100 mg as needed daily for ED (Patient not taking: Reported on 08/04/2019)   . testosterone (ANDRODERM) 4 MG/24HR PT24 patch Place 1 patch onto the  skin daily. (Patient not taking: Reported on 08/04/2019)    No facility-administered encounter medications on file as of 11/01/2019.   Immunization History  Administered Date(s) Administered  . Influenza, High Dose Seasonal PF 06/06/2018, 04/27/2019  . Influenza,inj,Quad PF,6+ Mos 05/16/2014  . PPD Test 09/23/2011  . Pneumococcal Conjugate-13 10/08/2018  . Pneumococcal Polysaccharide-23 10/20/2013  . Tdap 10/20/2013   Received both COVID vaccines.  Current Diagnosis/Assessment:  Goals Addressed            This Visit's Progress   . A1c goal less than 7%      . LDL goal less than 100      . Pharmacy Care Plan       CARE PLAN ENTRY  Current Barriers:  . Chronic Disease Management support, education, and care coordination needs related to Diabetes, HLD, Low Testosterone, Back Pain  Pharmacist Clinical Goal(s):  Marland Kitchen A1c less than 7% . LDL goal less than 100  Interventions: . Comprehensive medication review performed. . Discussed medication options if medication titration needed for diabetes or hyperlipidemia  Patient Self Care Activities:  . 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   Diabetes   Recent Relevant Labs: Lab Results  Component Value Date/Time   HGBA1C  7.6 (H) 07/01/2019 09:04 AM   HGBA1C 7.1 (H) 10/08/2018 09:35 AM   MICROALBUR 1.0 07/01/2019 09:04 AM   MICROALBUR <0.7 03/26/2018 09:20 AM    Goal a1c <7%  Checking BG: Never Does not have glucometer supplies. He does not like poking himself. He passed out his first time attempting to give himself his testosterone shot  Patient is currently uncontrolled on the following  medications: metformin 500mg  #1 tab AM, #1.5 tabs HS  Gets diarrhea with taking 3 tabs daily, cut back to 2 (states that a1c of 7.6% was at a dose of 2 daily).  Can take 1AM + 1.5HS The pandemic caused him to eat lots of comfort food, cookies, etc. Patient has made drastic changes to his diet. Stopped eating saturated fat.  Drinked 5 nights per week February - November.  November was a wake up call for him.  Has lost 8-11lbs since November. Has been doing intermittent fasting.   Last diabetic Eye exam:  Lab Results  Component Value Date/Time   HMDIABEYEEXA No Retinopathy 10/30/2016 04:34 PM    Last diabetic Foot exam: No results found for: HMDIABFOOTEX (completed on 07/01/19?)  We discussed: Possibility of using extended release metformin if increased dose needed  Plan -Continue current medications  -Schedule follow up appt with Dr. Lorelei Pont for updated labs  Future Plan -Titrate metformin with extended release formulation if needed   Hyperlipidemia   Lipid Panel     Component Value Date/Time   CHOL 232 (H) 07/01/2019 0904   TRIG 202.0 (H) 07/01/2019 0904   HDL 44.30 07/01/2019 0904   CHOLHDL 5 07/01/2019 0904   VLDL 40.4 (H) 07/01/2019 0904   LDLCALC 159 (H) 03/26/2018 0920   LDLDIRECT 146.0 07/01/2019 0904     The 10-year ASCVD risk score (Goff DC Jr., et al., 2013) is: 27.7%   Values used to calculate the score:     Age: 45 years     Sex: Male     Is Non-Hispanic African American: No     Diabetic: Yes     Tobacco smoker: No     Systolic Blood Pressure: 0000000 mmHg     Is BP treated: No     HDL Cholesterol: 44.3 mg/dL     Total Cholesterol: 232 mg/dL   Patient has failed these meds in past: atorvastatin (aches) Patient is currently uncontrolled on the following medications: rosuvastatin 5mg  daily (not taking), coenzyme Q10 daily, fish oil 1200mg  2AM, 1PM   Rosuvastatin: Took once every 3 days and felt horrible, so he stopped taking it.  CoQ10: Did not feel that  it helped Stopped eating bacon Eggs once every 2 weeks vs daily Taking 3600mg  of fish oil daily. Started fish oil around early December.  Wonders if taking 4g of fish oil daily will help with his cholesterol.  Cholestoff - 900mg  of sterols   Father passed away due to heart attack at age 27.   Concerned with cost of statin alternative  We discussed:  significance of his elevated lipid panel and statin alternatives (ezetimibe, fish oil)  Plan -Continue control with diet and exercise  -Schedule follow up appt with Dr. Lorelei Pont for updated labs  Future Plan -If Lipid panel still elevated consider initiating ezetimibe 10mg  daily  Low Testosterone   07/01/19:  Testosterone - 1098 Free Testosterone - 224.3 Bioavailable Testosterone - 441.8  Patient has failed these meds in past: None noted  Patient is currently controlled on the following medications: testosterone cypionate 200mg /mL 0.26mL every  2 weeks  Does not feel like he crashes after 2 weeks (felt this way with 94mL dose) Tried to get patches covered, but were expensive. Ok to continue injection. Wife injects for him.   Plan -Continue current medications   Back Pain    Patient has failed these meds in past: None noted  Patient is currently controlled on the following medications: cyclobenzaprine 10mg  PRN (not prescribed; this makes him feel like a drunk man for about 24 hours)  Takes about 1 tablet per year States chiropractor has diminished cartilage in his spine. He was tilting to right from his hips up He was readjusted and this really improved his situation Was going to chiropractor Q4 weeks, then stopped when pandemic occurred.  Started feeling tight, took a cyclobenzaprine. Got readjusted again last week. This has improved symptoms  We discussed the importance of only using prescribed medications  Plan -Continue current management (refraining from cyclobenzaprine use)   Miscellaneous Meds Triamcinolone  0.025% cream (for "winter skin") Multivitamin daily l-methylfolate B6-B12 (Thrive) D3 2000 units #2 gummie daily Areds Preservision #1 tablet daily Alpha lipoic acid 600mg  1 daily

## 2019-11-02 NOTE — Progress Notes (Signed)
[facility] [place], [place], [place] [address] [phone]  Date:  11/04/2019   Name:  Randy Orr   DOB:  20-Feb-1951   MRN:  [id number]  PCP:  [name], MD    Chief Complaint: Hypogonadism   History of Present Illness:  Randy Orr is a 69 y.o. very pleasant male patient who presents with the following:  Here today for routine follow-up visit History of diabetes, peripheral neuropathy, hypogonadism, hyperlipidemia  He recently met with our clinical pharmacist, Dr. Elana Alm She made the following suggestions: -If his a1c is >7%, I would consider changing him to extended release metformin to see if he can better tolerate titration. He is currently taking 527m (1 tab) in the AM and 7542m(1.5 tabs) PM.  You could start with 1tab AM and 2 tabs PM for about 2 weeks then see if he can tolerate 2 tabs BID.  -If his LDL is >100, I would consider starting ezetimibe 1038mince he has not been able to tolerate the statin medications.    I last saw Randy Orr November 2020 He uses injectable testosterone-his last testosterone level was a bit too high and he decreased his dosage, we plan to recheck today.  He has felt fine with decreased dose His last dose was 6 days ago  Has not been able to tolerate statins-I prescribed a low-dose of Crestor in November.  He could not take it; even every 72 hours  No longer seeing urology will check PSA today  His last A1c went up a bit from previous, will check on this for him today He had his AAA screening in December, negative Lab Results  Component Value Date   HGBA1C 7.6 (H) 07/01/2019   He is not exercising as much as he would like, but does try to stay active He has dropped weight intentionally-  Eating less, intermittent fasting.  He is pleased   His wife Randy Mercurycently tripped and fell while exercising, and injured her nose.  We hope that she is feeling better  soon  Wt Readings from Last 3 Encounters:  11/04/19 217 lb (98.4 kg)  08/20/19 225 lb (102.1 kg)  08/04/19 225 lb (102.1 kg)     Patient Active Problem List   Diagnosis Date Noted  . Left knee pain 06/06/2016  . Peripheral sensory neuropathy due to type 2 diabetes mellitus ([facility]/01/2014  . Snoring 05/17/2014  . Obesity 05/17/2014  . Diabetes mellitus ([facility]/06/2012  . Hypogonadism male 09/23/2011  . Hyperlipidemia 09/23/2011    Past Medical History:  Diagnosis Date  . Diabetes mellitus without complication ([facility]  DM type 2  . GERD (gastroesophageal reflux disease)   . Hyperlipidemia   . Low testosterone   . Obesity 05/17/2014  . Peripheral sensory neuropathy due to type 2 diabetes mellitus ([facility]/01/2014  . Snoring 05/17/2014    Past Surgical History:  Procedure Laterality Date  . COLONOSCOPY  07/18/2014    Social History   Tobacco Use  . 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 Tobacco comment: smoked 30 years ago.  Substance Use Topics  . Alcohol use: Yes    Alcohol/week: 5.0 standard drinks    Types: 5 Glasses of wine per week  . Drug use: No    Family History  Problem Relation Age of Onset  . Diabetes Brother   . Heart attack Father   .  Colon cancer Neg Hx   . Esophageal cancer Neg Hx   . Rectal cancer Neg Hx   . Stomach cancer Neg Hx     Allergies  Allergen Reactions  . Statins     We have tried several, cannot tolerate     Medication list has been reviewed and updated.  Current Outpatient Medications on File Prior to Visit  Medication Sig Dispense Refill  . Coenzyme Q10 (CO Q 10 PO) Take 1 tablet by mouth daily.    Marland Kitchen l-methylfolate-B6-B12 (METANX) 3-35-2 MG TABS Take 1 tablet by mouth daily. 60 tablet 1  . metFORMIN (GLUCOPHAGE) 500 MG tablet TAKE 1 TABLET BY MOUTH EVERY MORNING AND TAKE 2 TABLETS EVERY EVENING 270 tablet 3  . Multiple Vitamin (MULTIVITAMIN) tablet Take 1 tablet by mouth daily.    .  Multiple Vitamins-Minerals (ZINC PO) Take by mouth.    Marland Kitchen NEEDLE, DISP, 23 G (B-D HYPODERMIC NEEDLE 23GX1") 23G X 1" MISC 1 each by Does not apply route once a week. 50 each 3  . testosterone cypionate (DEPOTESTOSTERONE CYPIONATE) 200 MG/ML injection INJECT 0.36m - EVERY 14 DAYS INTRAMUSCULARLY 6 mL 0  . triamcinolone (KENALOG) 0.025 % cream Apply topically 2 (two) times daily. 80 g 2   No current facility-administered medications on file prior to visit.    Review of Systems:  As per HPI- otherwise negative.   Physical Examination: Vitals:   11/04/19 0934  BP: 124/81  Pulse: 90  Resp: 15  Temp: (!) 97.4 F (36.3 C)  SpO2: 98%   Vitals:   11/04/19 0934  Weight: 217 lb (98.4 kg)  Height: '6\' 3"'  (1.905 m)   Body mass index is 27.12 kg/m. Ideal Body Weight: Weight in (lb) to have BMI = 25: 199.6  GEN: no acute distress.  Mild overweight, tall build.  Looks well HEENT: Atraumatic, Normocephalic.  Ears and Nose: No external deformity. CV: RRR, No M/G/R. No JVD. No thrill. No extra heart sounds. PULM: CTA B, no wheezes, crackles, rhonchi. No retractions. No resp. distress. No accessory muscle use. ABD: S, NT, ND, +BS. No rebound. No HSM. EXTR: No c/c/e PSYCH: Normally interactive. Conversant.    Assessment and Plan: Type 2 diabetes mellitus without complication, without long-term current use of insulin (HT[place] - Plan: Hemoglobin A1c  Hypogonadism male - Plan: Testosterone Total,Free,Bio, Males-([facility])  Mixed hyperlipidemia - Plan: Lipid panel  Screening for prostate cancer - Plan: PSA, CANCELED: PSA  Back spasm - Plan: cyclobenzaprine (FLEXERIL) 10 MG tablet  Following up today for a couple of chronic issues He has occasional spasms of his back and will use a rare Flexeril.  Refilled for him today Need to follow-up on PSA today, ordered for patient Recent testosterone replacement adjustment, check testosterone level He has lost some weight, we hope that lipids and A1c  will reflect this  Assuming labs are favorable, plan to recheck in 6 months This visit occurred during the SARS-CoV-2 public health emergency.  Safety protocols were in place, including screening questions prior to the visit, additional usage of staff PPE, and extensive cleaning of exam room while observing appropriate contact time as indicated for disinfecting solutions.    Signed JLamar Blinks MD

## 2019-11-03 ENCOUNTER — Other Ambulatory Visit: Payer: Self-pay

## 2019-11-03 DIAGNOSIS — E119 Type 2 diabetes mellitus without complications: Secondary | ICD-10-CM

## 2019-11-03 DIAGNOSIS — E782 Mixed hyperlipidemia: Secondary | ICD-10-CM

## 2019-11-03 NOTE — Progress Notes (Signed)
Referral placed for [facility], Pharm D

## 2019-11-04 ENCOUNTER — Other Ambulatory Visit: Payer: Self-pay

## 2019-11-04 ENCOUNTER — Encounter: Payer: Self-pay | Admitting: Family Medicine

## 2019-11-04 ENCOUNTER — Ambulatory Visit (INDEPENDENT_AMBULATORY_CARE_PROVIDER_SITE_OTHER): Payer: Medicare HMO | Admitting: Family Medicine

## 2019-11-04 VITALS — BP 124/81 | HR 90 | Temp 97.4°F | Resp 15 | Ht 75.0 in | Wt 217.0 lb

## 2019-11-04 DIAGNOSIS — E119 Type 2 diabetes mellitus without complications: Secondary | ICD-10-CM | POA: Diagnosis not present

## 2019-11-04 DIAGNOSIS — E782 Mixed hyperlipidemia: Secondary | ICD-10-CM | POA: Diagnosis not present

## 2019-11-04 DIAGNOSIS — M62830 Muscle spasm of back: Secondary | ICD-10-CM

## 2019-11-04 DIAGNOSIS — Z125 Encounter for screening for malignant neoplasm of prostate: Secondary | ICD-10-CM | POA: Diagnosis not present

## 2019-11-04 DIAGNOSIS — E291 Testicular hypofunction: Secondary | ICD-10-CM | POA: Diagnosis not present

## 2019-11-04 LAB — LIPID PANEL
Cholesterol: 191 mg/dL (ref 0–200)
HDL: 41.7 mg/dL (ref >39.00)
LDL Cholesterol: 125 mg/dL — ABNORMAL HIGH (ref 0–99)
NonHDL: 149.24
Total CHOL/HDL Ratio: 5
Triglycerides: 123 mg/dL (ref 0.0–149.0)
VLDL: 24.6 mg/dL (ref 0.0–40.0)

## 2019-11-04 LAB — HEMOGLOBIN A1C: Hgb A1c MFr Bld: 6.7 % — ABNORMAL HIGH (ref 4.6–6.5)

## 2019-11-04 MED ORDER — CYCLOBENZAPRINE HCL 10 MG PO TABS: 10.0000 mg | ORAL_TABLET | Freq: Every day | ORAL | 0 refills | Status: AC | PRN

## 2019-11-04 NOTE — Patient Instructions (Addendum)
Good to see you again today- I will be in touch with your labs asap  Assuming all is ok we can plan for 6 months   I refilled your flexeril to use as needed for your back- watch for sedation with this medication

## 2019-11-05 LAB — TESTOSTERONE TOTAL,FREE,BIO, MALES
Albumin: 4.5 g/dL (ref 3.6–5.1)
Sex Hormone Binding: 31 nmol/L (ref 22–77)
Testosterone, Bioavailable: 266.6 ng/dL (ref >=110.0)
Testosterone, Free: 129.6 pg/mL (ref 46.0–224.0)
Testosterone: 821 ng/dL (ref 250–827)

## 2019-11-05 LAB — PSA: PSA: 2.1 ng/mL (ref <=4.0)

## 2019-12-20 ENCOUNTER — Other Ambulatory Visit: Payer: Self-pay | Admitting: Family Medicine

## 2019-12-20 DIAGNOSIS — E291 Testicular hypofunction: Secondary | ICD-10-CM

## 2019-12-22 NOTE — Telephone Encounter (Signed)
Requesting:Testosterone Contract: none [id number] Last Visit: 11/04/2019 Next Visit: 01/31/2020 Last Refill:09/16/2019  Please Advise

## 2019-12-22 NOTE — Telephone Encounter (Signed)
Pt called to check status on rx refill testosterone cypionate (DEPOTESTOSTERONE CYPIONATE) 200 MG/ML injection [id number]    Please advise.

## 2020-01-31 ENCOUNTER — Ambulatory Visit: Payer: Medicare HMO | Admitting: Pharmacist

## 2020-01-31 ENCOUNTER — Other Ambulatory Visit: Payer: Self-pay

## 2020-01-31 DIAGNOSIS — G720 Drug-induced myopathy: Secondary | ICD-10-CM

## 2020-01-31 DIAGNOSIS — T466X5A Adverse effect of antihyperlipidemic and antiarteriosclerotic drugs, initial encounter: Secondary | ICD-10-CM

## 2020-01-31 DIAGNOSIS — E782 Mixed hyperlipidemia: Secondary | ICD-10-CM

## 2020-01-31 DIAGNOSIS — E119 Type 2 diabetes mellitus without complications: Secondary | ICD-10-CM

## 2020-01-31 NOTE — Chronic Care Management (AMB) (Signed)
Chronic Care Management Pharmacy  Name: Randy Orr  MRN: [id number] DOB: 04-Feb-1951  Chief Complaint/ HPI  Randy Orr,  69 y.o. , male presents for their Follow-Up CCM visit with the clinical pharmacist via telephone due to COVID-19 Pandemic.  PCP : Randy Mclean, MD  Their chronic conditions include: Diabetes, HLD, Low Testosterone, Back Pain  Office Visits: 11/04/19: Visit w/ Dr. Lorelei Orr - Pt has felt fine with decreased testosterone. Labs ordered (a1c, lipid, psa, testosterone). Refilled cyclobenzaprine.    Consult Visit: None since last CCM visit on 11/01/19  Medications: Outpatient Encounter Medications as of 01/31/2020  Medication Sig Note  . Coenzyme Q10 (CO Q 10 PO) Take 1 tablet by mouth daily.   . cyclobenzaprine (FLEXERIL) 10 MG tablet Take 1 tablet (10 mg total) by mouth daily as needed for muscle spasms.   Marland Kitchen l-methylfolate-B6-B12 (METANX) 3-35-2 MG TABS Take 1 tablet by mouth daily. 11/01/2019: Thrive  . metFORMIN (GLUCOPHAGE) 500 MG tablet TAKE 1 TABLET BY MOUTH EVERY MORNING AND TAKE 2 TABLETS EVERY EVENING 11/01/2019: Taking 1AM and 1.5HS  . Multiple Vitamin (MULTIVITAMIN) tablet Take 1 tablet by mouth daily. 11/01/2019: Centrum Silver  . Multiple Vitamins-Minerals (ZINC PO) Take by mouth.   Marland Kitchen NEEDLE, DISP, 23 G (B-D HYPODERMIC NEEDLE 23GX1") 23G X 1" MISC 1 each by Does not apply route once a week.   . testosterone cypionate (DEPOTESTOSTERONE CYPIONATE) 200 MG/ML injection INJECT 0.75ML INTRAMUSCULARLY EVERY 14 DAYS   . triamcinolone (KENALOG) 0.025 % cream Apply topically 2 (two) times daily.    No facility-administered encounter medications on file as of 01/31/2020.   Immunization History  Administered Date(s) Administered  . Influenza, High Dose Seasonal PF 06/06/2018, 04/27/2019  . Influenza,inj,Quad PF,6+ Mos 05/16/2014  . PPD Test 09/23/2011  . Pneumococcal Conjugate-13 10/08/2018  . Pneumococcal Polysaccharide-23 10/20/2013  . Tdap 10/20/2013    Received both COVID vaccines.  SDOH Screenings   Alcohol Screen:   . Last Alcohol Screening Score (AUDIT):   Depression (PHQ2-9):   . PHQ-2 Score:   Financial Resource Strain:   . Difficulty of Paying Living Expenses:   Food Insecurity:   . Worried About Charity fundraiser in the Last Year:   . [place] in the Last Year:   Housing:   . Last Housing Risk Score:   Physical Activity:   . Days of Exercise per Week:   . Minutes of Exercise per Session:   Social Connections:   . Frequency of Communication with Friends and Family:   . Frequency of Social Gatherings with Friends and Family:   . Attends Religious Services:   . Active Member of Clubs or Organizations:   . Attends Archivist Meetings:   Marland Kitchen Marital Status:   Stress:   . Feeling of Stress :   Tobacco Use: Medium Risk  . Smoking Tobacco Use: Former Smoker  . Smokeless Tobacco Use: Never Used  Transportation Needs:   . Film/video editor (Medical):   Marland Kitchen Lack of Transportation (Non-Medical):      Current Diagnosis/Assessment:  Goals Addressed            This Visit's Progress   . Chronic Care Management Pharmacy Care       CARE PLAN ENTRY (see longitudinal plan of care for additional care plan information)  Current Barriers:  . Chronic Disease Management support, education, and care coordination needs related to Diabetes, HLD, Low Testosterone, Back Pain   Hyperlipidemia Lab Results  Component  Value Date/Time   LDLCALC 125 (H) 11/04/2019 09:27 AM   LDLDIRECT 146.0 07/01/2019 09:04 AM   . Pharmacist Clinical Goal(s): o Over the next 90 days, patient will work with PharmD and providers to achieve LDL goal < 100 . Current regimen:  o Cholestoff - 900mg  of sterols, coenzyme Q10 daily, fish oil 1200mg  2AM, 1PM  . Interventions: o Discussed LDL goals and importance of reducing risk of heart attack and stroke o Consider starting ezetimibe 10mg  daily if next lipid panel is greater than  100 . Patient self care activities - Over the next 90 days, patient will: o Maintain cholesterol medication regimen.  Diabetes Lab Results  Component Value Date/Time   HGBA1C 6.7 (H) 11/04/2019 09:27 AM   HGBA1C 7.6 (H) 07/01/2019 09:04 AM   . Pharmacist Clinical Goal(s): o Over the next 90 days, patient will work with PharmD and providers to maintain A1c goal <7% . Current regimen:  o Metformin 500mg  #1 tab AM, #1.5 tabs HS . Interventions: o Discussed importance of diet and exercise . Patient self care activities - Over the next 90 days, patient will: o Maintain a1c less than 7%  Medication management . Pharmacist Clinical Goal(s): o Over the next 90 days, patient will work with PharmD and providers to maintain optimal medication adherence . [facility] . Interventions o Comprehensive medication review performed. o Continue current medication management strategy . 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   Diabetes   Recent Relevant Labs: Lab Results  Component Value Date/Time   HGBA1C 6.7 (H) 11/04/2019 09:27 AM   HGBA1C 7.6 (H) 07/01/2019 09:04 AM   MICROALBUR 1.0 07/01/2019 09:04 AM   MICROALBUR <0.7 03/26/2018 09:20 AM    Goal a1c <7%  Checking BG: Never Does not have glucometer supplies. He does not like poking himself. He passed out his first time attempting to give himself his testosterone shot  Patient is currently controlled and improved from previous on the  following medications: metformin 500mg  #1 tab AM, #1.5 tabs HS  From 11/01/19 CCM Visit Gets diarrhea with taking 3 tabs daily, cut back to 2 (states that a1c of 7.6% was at a dose of 2 daily).  Can take 1AM + 1.5HS The pandemic caused him to eat lots of comfort food, cookies, etc. Patient has made drastic changes to his diet. Stopped eating saturated fat.  Drinked 5 nights per week February - November.  November was a wake up call for him.  Has lost 8-11lbs since November. Has been doing intermittent fasting.   Last diabetic Eye exam:  Lab Results  Component Value Date/Time   HMDIABEYEEXA No Retinopathy 10/30/2016 04:34 PM    Last diabetic Foot exam: No results found for: HMDIABFOOTEX (completed on 07/01/19?)  We discussed: Possibility of using extended release metformin if increased dose needed   Update 01/31/20 Congratulated patient on a1c improvement.  States he is walking more and has lost weight.  Plan -Continue current medications  Future Plan -Titrate metformin with extended release formulation if needed   Hyperlipidemia   Lipid Panel     Component Value Date/Time   CHOL 191 11/04/2019 0927   TRIG 123.0 11/04/2019 0927   HDL 41.70 11/04/2019 0927   CHOLHDL 5 11/04/2019 0927   VLDL 24.6 11/04/2019 0927   LDLCALC 125 (H) 11/04/2019 0927   LDLDIRECT 146.0 07/01/2019 0904    LDL goal <100  The 10-year ASCVD risk score Mikey Bussing DC Jr., et al., 2013) is: 25.1%   Values used to calculate the score:     Age: 60 years     Sex: Male     Is Non-Hispanic African American: No     Diabetic: Yes     Tobacco smoker: No     Systolic Blood Pressure: 299 mmHg     Is BP treated: No     HDL Cholesterol: 41.7 mg/dL     Total Cholesterol: 191 mg/dL   Patient has failed these meds in past: atorvastatin (aches) Patient is currently uncontrolled on the following medications:Cholestoff - 900mg  of sterols, coenzyme Q10 daily, fish oil 1200mg  2AM, 1PM   From 11/02/19 CCM  Visit Rosuvastatin: Took once every 3 days and felt horrible, so he stopped taking it.  CoQ10: Did not feel that it helped Stopped eating bacon Eggs once every 2 weeks vs daily Taking 3600mg  of fish oil daily. Started fish oil around early December.  Wonders if taking 4g of fish oil daily will help with his cholesterol.  Cholestoff - 900mg  of sterols   Father passed away due to heart attack at age 93.   Concerned with cost of statin alternative  We discussed:  significance of his elevated lipid panel and statin alternatives (ezetimibe, fish oil)   Update 01/31/20 Congratulated patient on weight loss and LDL improvement. Reinforced LDL goal. Feels he was not doing as much walking when he had labs drawn, but he has increased. States his goal is to do 3.5 miles daily. Patient would like to continue lifestyle modifications before adding ezetimibe. Noting his great improvement, I'm agreeable with this plan.  Plan -Continue current medications  Future Plan -Consider initiation of ezetimibe 10mg  daily since most recent lipid panel had LDL >100    Low Testosterone   11/04/19:  Testosterone - 821 Free Testosterone - 129.6 Bioavailable Testosterone - 129.6  Patient has failed these meds in past: None noted  Patient is currently controlled on the following medications: testosterone cypionate 200mg /mL 0.75mL every 2 weeks  From 11/02/19 CCM Visit Does not feel like he crashes after 2 weeks (felt this way with 14mL dose) Tried to get patches covered, but were expensive. Ok to continue injection. Wife injects for him.   Update 01/31/20 He states he is still tolerating his current dose. The two days prior to his injection he experiences mood swings.   Plan -Continue current medications   Back Pain    Patient has failed these meds in past: None noted  Patient is currently controlled on the following medications: cyclobenzaprine 10mg  PRN   From 11/02/19 CCM Visit Takes about 1 tablet  per year States chiropractor states he has diminished cartilage in his spine. He was tilting to right from his hips up He was readjusted and this really improved his situation Was going to chiropractor Q4 weeks, then stopped when pandemic occurred.  Started feeling tight, took a cyclobenzaprine. Got readjusted again last week. This has improved symptoms  We discussed the importance of only using prescribed  medications  Update 01/31/20 He is re-engaged with chiropractic care. He has a stretching regimen that "7/8 times gets my back to pop without going to the chiropractor". States he is going around once a month to the chiropractor.   Cyclobenzaprine: Has not used since getting prescribed.  Plan -Continue current medications   Miscellaneous Meds Triamcinolone 0.025% cream (for "winter skin") Multivitamin daily l-methylfolate B6-B12 (Thrive) D3 2000 units #2 gummie daily Areds Preservision #1 tablet daily Alpha lipoic acid 600mg  1 daily

## 2020-01-31 NOTE — Patient Instructions (Signed)
Visit Information  Goals Addressed            This Visit's Progress   . Chronic Care Management Pharmacy Care       CARE PLAN ENTRY (see longitudinal plan of care for additional care plan information)  Current Barriers:  . Chronic Disease Management support, education, and care coordination needs related to Diabetes, HLD, Low Testosterone, Back Pain   Hyperlipidemia Lab Results  Component Value Date/Time   LDLCALC 125 (H) 11/04/2019 09:27 AM   LDLDIRECT 146.0 07/01/2019 09:04 AM   . Pharmacist Clinical Goal(s): o Over the next 90 days, patient will work with PharmD and providers to achieve LDL goal < 100 . Current regimen:  o Cholestoff - 900mg  of sterols, coenzyme Q10 daily, fish oil 1200mg  2AM, 1PM  . Interventions: o Discussed LDL goals and importance of reducing risk of heart attack and stroke o Consider starting ezetimibe 10mg  daily if next lipid panel is greater than 100 . Patient self care activities - Over the next 90 days, patient will: o Maintain cholesterol medication regimen.  Diabetes Lab Results  Component Value Date/Time   HGBA1C 6.7 (H) 11/04/2019 09:27 AM   HGBA1C 7.6 (H) 07/01/2019 09:04 AM   . Pharmacist Clinical Goal(s): o Over the next 90 days, patient will work with PharmD and providers to maintain A1c goal <7% . Current regimen:  o Metformin 500mg  #1 tab AM, #1.5 tabs HS . Interventions: o Discussed importance of diet and exercise . Patient self care activities - Over the next 90 days, patient will: o Maintain a1c less than 7%  Medication management . Pharmacist Clinical Goal(s): o Over the next 90 days, patient will work with PharmD and providers to maintain optimal medication adherence . [facility] . Interventions o Comprehensive medication review performed. o Continue current medication management strategy . Patient self care activities - Over the next 90 days, patient will: o Focus on medication adherence by filling and  taking medications appropriately  o Take medications as prescribed o Report any questions or concerns to PharmD and/or provider(s)  Please see past updates related to this goal by clicking on the "Past Updates" button in the selected goal          The patient verbalized understanding of instructions provided today and agreed to receive a mailed copy of patient instruction and/or educational materials.  Telephone follow up appointment with pharmacy team member scheduled for: 06/01/2020  Randy Orr, PharmD Clinical Pharmacist [facility] [phone]

## 2020-03-12 ENCOUNTER — Other Ambulatory Visit: Payer: Self-pay | Admitting: Family Medicine

## 2020-03-12 DIAGNOSIS — E291 Testicular hypofunction: Secondary | ICD-10-CM

## 2020-03-13 NOTE — Telephone Encounter (Signed)
Requesting: Testosterone  Contract: none IFX:[id number] Last Visit:11/04/2019 Next Visit: none scheduled Last Refill: 12/22/2019  Please Advise

## 2020-04-12 ENCOUNTER — Telehealth: Payer: Self-pay | Admitting: Pharmacist

## 2020-04-12 NOTE — Progress Notes (Addendum)
Chronic Care Management Pharmacy Assistant   Name: Randy Orr  MRN: [id number] DOB: 08/06/1951  Reason for Encounter: Disease State  Patient Questions:  1.  Have you seen any other providers since your last visit? No  2.  Any changes in your medicines or health? No    PCP : Copland, Gay Filler, MD   Their chronic conditions include: Diabetes, HLD, Low Testosterone, Back Pain  There are no OV's, Consults, or Hospitalizations since their last visit with the clinical Pharmacist.  Allergies:   Allergies  Allergen Reactions  . Statins     We have tried several, cannot tolerate     Medications: Outpatient Encounter Medications as of 04/12/2020  Medication Sig Note  . Coenzyme Q10 (CO Q 10 PO) Take 1 tablet by mouth daily.   . cyclobenzaprine (FLEXERIL) 10 MG tablet Take 1 tablet (10 mg total) by mouth daily as needed for muscle spasms.   Marland Kitchen l-methylfolate-B6-B12 (METANX) 3-35-2 MG TABS Take 1 tablet by mouth daily. 11/01/2019: Thrive  . metFORMIN (GLUCOPHAGE) 500 MG tablet TAKE 1 TABLET BY MOUTH EVERY MORNING AND TAKE 2 TABLETS EVERY EVENING 11/01/2019: Taking 1AM and 1.5HS  . Multiple Vitamin (MULTIVITAMIN) tablet Take 1 tablet by mouth daily. 11/01/2019: Centrum Silver  . Multiple Vitamins-Minerals (ZINC PO) Take by mouth.   Marland Kitchen NEEDLE, DISP, 23 G (B-D HYPODERMIC NEEDLE 23GX1") 23G X 1" MISC 1 each by Does not apply route once a week.   . testosterone cypionate (DEPOTESTOSTERONE CYPIONATE) 200 MG/ML injection INJECT 0.75ML INTRAMUSCULARLY EVERY 14 DAYS   . triamcinolone (KENALOG) 0.025 % cream Apply topically 2 (two) times daily.    No facility-administered encounter medications on file as of 04/12/2020.    Current Diagnosis: Patient Active Problem List   Diagnosis Date Noted  . Left knee pain 06/06/2016  . Peripheral sensory neuropathy due to type 2 diabetes mellitus ([name]) 05/17/2014  . Snoring 05/17/2014  . Obesity 05/17/2014  . Diabetes mellitus ([place]) 09/23/2011  .  Hypogonadism male 09/23/2011  . Hyperlipidemia 09/23/2011    Goals Addressed   None    Recent Relevant Labs: Lab Results  Component Value Date/Time   HGBA1C 6.7 (H) 11/04/2019 09:27 AM   HGBA1C 7.6 (H) 07/01/2019 09:04 AM   MICROALBUR 1.0 07/01/2019 09:04 AM   MICROALBUR <0.7 03/26/2018 09:20 AM    Kidney Function Lab Results  Component Value Date/Time   CREATININE 1.10 07/01/2019 09:04 AM   CREATININE 1.21 03/26/2018 09:20 AM   CREATININE 1.12 03/01/2015 09:14 AM   CREATININE 1.15 05/16/2014 08:45 AM   GFR 66.92 07/01/2019 09:04 AM   GFRNONAA 71 03/01/2015 09:14 AM   GFRAA 82 03/01/2015 09:14 AM    . Current antihyperglycemic regimen:  o Metformin 500 mg one tab the morning and one and a half tab in the evening . What recent interventions/DTPs have been made to improve glycemic control:  o none . Have there been any recent hospitalizations or ED visits since last visit with CPP? No . Patient denies hypoglycemic symptoms, including None . Patient denies hyperglycemic symptoms, including none . How often are you checking your blood sugar? Patient states he is not checking his blood sugar at home because he has not been asked to do that. He has changed his eating habits and lost weight over the past couple of months. . During the week, how often does your blood glucose drop below 70? Never . Are you checking your feet daily/regularly? Yes  Patient did mention his right  big toe was slightly injured wearing a shoe that was slightly smaller than his usual size. He described the toe as being discolored and somewhat sore. Advised to contact his PCP if symptoms worsen. Patient reports he does not see a podiatrist.   Adherence Review: Is the patient currently on a STATIN medication? No Is the patient currently on ACE/ARB medication? No Does the patient have >5 day gap between last estimated fill dates? Yes (Script is written for metformin #1AM, #2PM for #270/90DS, then will cause  the fill dates to look inappropriate. Will need to have correct script sent in to reflect accurate fill dates. -Melvenia Beam Day, PharmD)   Follow-Up:  Pharmacist Review   Fanny Skates, [place] Pharmacist Assistant [phone]  Pt would benefit from updated metformin script to accurately reflect fill data.  Before sending in updated script, would be beneficial to assure a1c remaining at goal while on this dose.  Will coordinate with PCP to see if pt would be willing to come in for lab work prior to [name] visit on 06/01/20, and if a1c at goal send in updated metformin script to reflect the following: Metformin 500mg  #1 tab AM and #1.5 tab PM   Reviewed by: De Blanch, PharmD Clinical Pharmacist [facility] [phone]

## 2020-05-08 DIAGNOSIS — H524 Presbyopia: Secondary | ICD-10-CM | POA: Diagnosis not present

## 2020-05-09 DIAGNOSIS — R69 Illness, unspecified: Secondary | ICD-10-CM | POA: Diagnosis not present

## 2020-06-01 ENCOUNTER — Ambulatory Visit: Payer: Medicare HMO | Admitting: Pharmacist

## 2020-06-01 DIAGNOSIS — G720 Drug-induced myopathy: Secondary | ICD-10-CM

## 2020-06-01 DIAGNOSIS — E119 Type 2 diabetes mellitus without complications: Secondary | ICD-10-CM

## 2020-06-01 DIAGNOSIS — E782 Mixed hyperlipidemia: Secondary | ICD-10-CM

## 2020-06-01 NOTE — Chronic Care Management (AMB) (Signed)
Chronic Care Management Pharmacy  Name: Randy Orr  MRN: [id number] DOB: [birth year]  Chief Complaint/ HPI  Randy Orr,  69 y.o. , male presents for their Follow-Up CCM visit with the clinical pharmacist via telephone due to COVID-19 Pandemic.  PCP : Randy Mclean, MD  Their chronic conditions include: Diabetes, HLD, Low Testosterone, Back Pain  Office Visits: None since last CCM visit on 01/31/20.  Consult Visit: None since last CCM visit on 01/31/20.  Medications: Outpatient Encounter Medications as of 06/01/2020  Medication Sig Note  . Coenzyme Q10 (CO Q 10 PO) Take 1 tablet by mouth daily.   . cyclobenzaprine (FLEXERIL) 10 MG tablet Take 1 tablet (10 mg total) by mouth daily as needed for muscle spasms.   Marland Kitchen l-methylfolate-B6-B12 (METANX) 3-35-2 MG TABS Take 1 tablet by mouth daily. 11/01/2019: Thrive  . metFORMIN (GLUCOPHAGE) 500 MG tablet TAKE 1 TABLET BY MOUTH EVERY MORNING AND TAKE 2 TABLETS EVERY EVENING 11/01/2019: Taking 1AM and 1.5HS  . Multiple Vitamin (MULTIVITAMIN) tablet Take 1 tablet by mouth daily. 11/01/2019: Centrum Silver  . Multiple Vitamins-Minerals (ZINC PO) Take by mouth.   Marland Kitchen NEEDLE, DISP, 23 G (B-D HYPODERMIC NEEDLE 23GX1") 23G X 1" MISC 1 each by Does not apply route once a week.   . testosterone cypionate (DEPOTESTOSTERONE CYPIONATE) 200 MG/ML injection INJECT 0.75ML INTRAMUSCULARLY EVERY 14 DAYS   . triamcinolone (KENALOG) 0.025 % cream Apply topically 2 (two) times daily.    No facility-administered encounter medications on file as of 06/01/2020.   SDOH Screenings   Alcohol Screen:   . Last Alcohol Screening Score (AUDIT): Not on file  Depression (PHQ2-9): Low Risk   . PHQ-2 Score: 0  Financial Resource Strain:   . Difficulty of Paying Living Expenses: Not on file  Food Insecurity:   . Worried About Charity fundraiser in the Last Year: Not on file  . Ran Out of Food in the Last Year: Not on file  Housing:   . Last Housing Risk Score:  Not on file  Physical Activity:   . Days of Exercise per Week: Not on file  . Minutes of Exercise per Session: Not on file  Social Connections:   . Frequency of Communication with Friends and Family: Not on file  . Frequency of Social Gatherings with Friends and Family: Not on file  . Attends Religious Services: Not on file  . Active Member of Clubs or Organizations: Not on file  . Attends Archivist Meetings: Not on file  . Marital Status: Not on file  Stress:   . Feeling of Stress : Not on file  Tobacco Use: Medium Risk  . Smoking Tobacco Use: Former Smoker  . Smokeless Tobacco Use: Never Used  Transportation Needs:   . Film/video editor (Medical): Not on file  . Lack of Transportation (Non-Medical): Not on file     Current Diagnosis/Assessment:  Goals Addressed            This Visit's Progress   . Chronic Care Management Pharmacy Care       CARE PLAN ENTRY (see longitudinal plan of care for additional care plan information)  Current Barriers:  . Chronic Disease Management support, education, and care coordination needs related to Diabetes, HLD, Low Testosterone, Back Pain   Hyperlipidemia Lab Results  Component Value Date/Time   LDLCALC 125 (H) 11/04/2019 09:27 AM   LDLDIRECT 146.0 07/01/2019 09:04 AM   . Pharmacist Clinical Goal(s): o Over the next 90 days,  patient will work with PharmD and providers to achieve LDL goal < 100 . Current regimen:  o Cholestoff - 900mg  of sterols o Coenzyme Q10 daily o Fish oil 1200mg  2AM, 1PM  . Interventions: o Discussed LDL goals and importance of reducing risk of heart attack and stroke o Consider starting ezetimibe 10mg  daily if next lipid panel is greater than 100 . Patient self care activities - Over the next 90 days, patient will: o Maintain cholesterol medication regimen.  Diabetes Lab Results  Component Value Date/Time   HGBA1C 6.7 (H) 11/04/2019 09:27 AM   HGBA1C 7.6 (H) 07/01/2019 09:04 AM    . Pharmacist Clinical Goal(s): o Over the next 90 days, patient will work with PharmD and providers to maintain A1c goal <7% . Current regimen:  o Metformin 500mg  #1 tab AM, #1.5 tabs HS . Interventions: o Discussed importance of diet and exercise o Need to update metformin script . Patient self care activities - Over the next 90 days, patient will: o Maintain a1c less than 7%  Health Maintenance  . Pharmacist Clinical Goal(s) o Over the next 90 days, patient will work with PharmD and providers to complete health maintenance screenings/vaccinations . Interventions: o Discussed Shingrix vaccine . Patient self care activities - Over the next 90 days, patient will: o Complete Shingrix vaccine series    Medication management . Pharmacist Clinical Goal(s): o Over the next 90 days, patient will work with PharmD and providers to maintain optimal medication adherence . [facility] . Interventions o Comprehensive medication review performed. o Continue current medication management strategy . 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   Diabetes   Recent Relevant Labs: Lab Results  Component Value Date/Time   HGBA1C 6.7 (H) 11/04/2019 09:27 AM   HGBA1C 7.6 (H) 07/01/2019 09:04 AM   MICROALBUR 1.0 07/01/2019 09:04 AM   MICROALBUR <0.7 03/26/2018 09:20 AM    Goal a1c <7%  Checking BG: Never Does not have glucometer supplies. He does not like poking himself. He passed out his first time  attempting to give himself his testosterone shot  Patient is currently controlled and improved from previous on the following medications:   Metformin 500mg  #1 tab AM, #1.5 tabs HS  From 11/01/19 CCM Visit Gets diarrhea with taking 3 tabs daily, cut back to 2 (states that a1c of 7.6% was at a dose of 2 daily).  Can take 1AM + 1.5HS The pandemic caused him to eat lots of comfort food, cookies, etc. Patient has made drastic changes to his diet. Stopped eating saturated fat.  Drinked 5 nights per week February - November.  November was a wake up call for him.  Has lost 8-11lbs since November. Has been doing intermittent fasting.   Last diabetic Eye exam:  Lab Results  Component Value Date/Time   HMDIABEYEEXA No Retinopathy 10/30/2016 04:34 PM    Last diabetic Foot exam: No results found for: HMDIABFOOTEX (completed on 07/01/19?)  We discussed: Possibility of using extended  release metformin if increased dose needed   Update 01/31/20 Congratulated patient on a1c improvement.  States he is walking more and has lost weight.  Update 06/01/20 Has cut down on his beer consumption. Drank 1 beer 2-3 days. Need to update metformin script. Discussed possibility of extended release metformin if titration is needed. Would benefit from updated a1c. Need to get patient  How many metformin remaining? Last filled 05/02/20  Plan -Continue current medications  -Update script for metformin -Follow up with Dr. Lorelei Pont for updated labs   Future Plan -Titrate metformin with extended release formulation if needed   Hyperlipidemia   Lipid Panel     Component Value Date/Time   CHOL 191 11/04/2019 0927   TRIG 123.0 11/04/2019 0927   HDL 41.70 11/04/2019 0927   CHOLHDL 5 11/04/2019 0927   VLDL 24.6 11/04/2019 0927   LDLCALC 125 (H) 11/04/2019 0927   LDLDIRECT 146.0 07/01/2019 0904    LDL goal <100  The 10-year ASCVD risk score Mikey Bussing DC Jr., et al., 2013) is: 26.8%   Values used to  calculate the score:     Age: 24 years     Sex: Male     Is Non-Hispanic African American: No     Diabetic: Yes     Tobacco smoker: No     Systolic Blood Pressure: 630 mmHg     Is BP treated: No     HDL Cholesterol: 41.7 mg/dL     Total Cholesterol: 191 mg/dL   Patient has failed these meds in past: atorvastatin (statin myopathy) Patient is currently uncontrolled on the following medications:  Cholestoff - 900mg  of sterols  Coenzyme Q10 daily  Fish oil 1200mg  2AM, 1PM   From 11/02/19 CCM Visit Rosuvastatin: Took once every 3 days and felt horrible, so he stopped taking it.  CoQ10: Did not feel that it helped Stopped eating bacon Eggs once every 2 weeks vs daily Taking 3600mg  of fish oil daily. Started fish oil around early December.  Wonders if taking 4g of fish oil daily will help with his cholesterol.  Cholestoff - 900mg  of sterols   Father passed away due to heart attack at age 9.   Concerned with cost of statin alternative  We discussed:  significance of his elevated lipid panel and statin alternatives (ezetimibe, fish oil)   Update 01/31/20 Congratulated patient on weight loss and LDL improvement. Reinforced LDL goal. Feels he was not doing as much walking when he had labs drawn, but he has increased. States his goal is to do 3.5 miles daily. Patient would like to continue lifestyle modifications before adding ezetimibe. Noting his great improvement, I'm agreeable with this plan.  Update 06/01/20 Discussed ezetimibe if LDL >100 at next lab visit. Patient agreeable to this. Would benefit from updated LDL. If LDL >100 initiate ezetimibe 10mg  daily.  Plan -Continue current medications -Followup with Dr. Lorelei Pont for updated LDL  Future Plan -Consider initiation of ezetimibe 10mg  daily since most recent lipid panel had LDL >100  Low Testosterone   11/04/19:  Testosterone - 821 Free Testosterone - 129.6 Bioavailable Testosterone - 129.6  Patient has failed these  meds in past: None noted  Patient is currently controlled on the following medications:   Testosterone cypionate 200mg /mL 0.71mL every 2 weeks  From 11/02/19 CCM Visit Does not feel like he crashes after 2 weeks (felt this way with 65mL dose) Tried to get patches covered, but were expensive. Ok to continue injection. Wife injects for him.  Update 01/31/20 He states he is still tolerating his current dose. The two days prior to his injection he experiences mood swings.   Update 06/01/20 Still taking and tolerating. Aware he is due for follow up visit.  Plan -Continue current medications  -Follow up with Dr. Lorelei Pont for updated labs  Back Pain    Patient has failed these meds in past: None noted  Patient is currently controlled on the following medications:   Cyclobenzaprine 10mg  PRN   From 11/02/19 CCM Visit Takes about 1 tablet per year States chiropractor states he has diminished cartilage in his spine. He was tilting to right from his hips up He was readjusted and this really improved his situation Was going to chiropractor Q4 weeks, then stopped when pandemic occurred.  Started feeling tight, took a cyclobenzaprine. Got readjusted again last week. This has improved symptoms  We discussed the importance of only using prescribed medications  Update 01/31/20 He is re-engaged with chiropractic care. He has a stretching regimen that "7/8 times gets my back to pop without going to the chiropractor". States he is going around once a month to the chiropractor.   Cyclobenzaprine: Has not used since getting prescribed.  Update 06/01/20 Pain comes and goes. Brother had a spinal fusion. Wonders if this is genetic.  Plan -Continue current medications  Vaccines   Reviewed and discussed patient's vaccination history.    Immunization History  Administered Date(s) Administered  . Influenza, High Dose Seasonal PF 06/06/2018, 04/27/2019  . Influenza,inj,Quad PF,6+ Mos 05/16/2014   . PPD Test 09/23/2011  . Pneumococcal Conjugate-13 10/08/2018  . Pneumococcal Polysaccharide-23 10/20/2013  . Tdap 10/20/2013   Covid Vaccine:  Pfizer - 1st 2 vaccines at [employer], booster at [place] 09/03/19, 09/24/19, 05/09/20  Flu Vaccine: 05/09/20  Plan -Recommended patient receive Shingrix vaccine in pharmacy.  -Update [facility]Z record  Miscellaneous Meds Triamcinolone 0.025% cream (for "winter skin") Multivitamin daily l-methylfolate B6-B12 (Thrive) D3 2000 units #2 gummie daily Areds Preservision #1 tablet daily Alpha lipoic acid 600mg  1 daily  De Blanch, PharmD Clinical Pharmacist [facility] at [facility] [phone]

## 2020-06-01 NOTE — Patient Instructions (Signed)
Visit Information  Goals Addressed            This Visit's Progress   . Chronic Care Management Pharmacy Care       CARE PLAN ENTRY (see longitudinal plan of care for additional care plan information)  Current Barriers:  . Chronic Disease Management support, education, and care coordination needs related to Diabetes, HLD, Low Testosterone, Back Pain   Hyperlipidemia Lab Results  Component Value Date/Time   LDLCALC 125 (H) 11/04/2019 09:27 AM   LDLDIRECT 146.0 07/01/2019 09:04 AM   . Pharmacist Clinical Goal(s): o Over the next 90 days, patient will work with PharmD and providers to achieve LDL goal < 100 . Current regimen:  o Cholestoff - 900mg  of sterols o Coenzyme Q10 daily o Fish oil 1200mg  2AM, 1PM  . Interventions: o Discussed LDL goals and importance of reducing risk of heart attack and stroke o Consider starting ezetimibe 10mg  daily if next lipid panel is greater than 100 . Patient self care activities - Over the next 90 days, patient will: o Maintain cholesterol medication regimen.  Diabetes Lab Results  Component Value Date/Time   HGBA1C 6.7 (H) 11/04/2019 09:27 AM   HGBA1C 7.6 (H) 07/01/2019 09:04 AM   . Pharmacist Clinical Goal(s): o Over the next 90 days, patient will work with PharmD and providers to maintain A1c goal <7% . Current regimen:  o Metformin 500mg  #1 tab AM, #1.5 tabs HS . Interventions: o Discussed importance of diet and exercise o Need to update metformin script . Patient self care activities - Over the next 90 days, patient will: o Maintain a1c less than 7%  Health Maintenance  . Pharmacist Clinical Goal(s) o Over the next 90 days, patient will work with PharmD and providers to complete health maintenance screenings/vaccinations . Interventions: o Discussed Shingrix vaccine . Patient self care activities - Over the next 90 days, patient will: o Complete Shingrix vaccine series    Medication management . Pharmacist Clinical  Goal(s): o Over the next 90 days, patient will work with PharmD and providers to maintain optimal medication adherence . [facility] . Interventions o Comprehensive medication review performed. o Continue current medication management strategy . Patient self care activities - Over the next 90 days, patient will: o Focus on medication adherence by filling and taking medications appropriately  o Take medications as prescribed o Report any questions or concerns to PharmD and/or provider(s)  Please see past updates related to this goal by clicking on the "Past Updates" button in the selected goal          The patient verbalized understanding of instructions provided today and agreed to receive a mailed copy of patient instruction and/or educational materials.  Telephone follow up appointment with pharmacy team member scheduled for: 09/01/2020  Melvenia Beam Aviv Rota, PharmD Clinical Pharmacist [facility] at [facility] [phone]

## 2020-06-03 ENCOUNTER — Telehealth: Payer: Self-pay | Admitting: Family Medicine

## 2020-06-03 DIAGNOSIS — E119 Type 2 diabetes mellitus without complications: Secondary | ICD-10-CM

## 2020-06-03 MED ORDER — METFORMIN HCL 500 MG PO TABS: ORAL_TABLET | ORAL | 3 refills | Status: DC

## 2020-06-03 NOTE — Telephone Encounter (Signed)
-----   Message from [place], [facility] sent at 06/01/2020  9:54 AM EDT ----- Regarding: Metformin Script Update at Follow up visit Hey Dr. Lorelei Pont!   I'm going to get one of the ladies up front to get him scheduled with you.  When he comes for his visit and labs, would you be willing to update his metformin script to reflect how he takes it so that his fill dates are reflected appropriately (pending his a1c remains at goal).   He is currently taking  Metformin 500mg  #1AM and #1.5HS #225 for 90 DS  Thanks, De Blanch, PharmD Clinical Pharmacist [facility] at [facility] [phone]

## 2020-06-05 ENCOUNTER — Telehealth: Payer: Self-pay

## 2020-06-05 ENCOUNTER — Encounter: Payer: Self-pay | Admitting: Family Medicine

## 2020-06-05 ENCOUNTER — Other Ambulatory Visit: Payer: Self-pay | Admitting: Family Medicine

## 2020-06-05 DIAGNOSIS — E291 Testicular hypofunction: Secondary | ICD-10-CM

## 2020-06-05 NOTE — Telephone Encounter (Signed)
Last written: 03/13/20 Last ov:  11/04/19 Next ov: none Contract: none UDS: none

## 2020-06-05 NOTE — Telephone Encounter (Signed)
PA initiated via Covermymeds; KEY:  [id number]. Awaiting determination.

## 2020-06-05 NOTE — Telephone Encounter (Signed)
PA approved.

## 2020-06-15 ENCOUNTER — Ambulatory Visit: Payer: Medicare HMO | Admitting: Family Medicine

## 2020-06-17 NOTE — Progress Notes (Addendum)
[facility], [place], [place] [address] [phone]  Date:  06/19/2020   Name:  Randy Orr   DOB:  [birth year]   MRN:  [id number]  PCP:  [name], MD    Chief Complaint: Diabetes and Lab Work   History of Present Illness:  Randy Orr is a 69 y.o. very pleasant male patient who presents with the following:  Patient here today for periodic follow-up visit and labs- History of diabetes, peripheral neuropathy, hypogonadism, hyperlipidemia Last seen by myself in March of this year  He has been unable to tolerate statins-we have tried several times, even low-dose and intermittent dosing caused excessive side effects. Therefore he is not currently on a lipid-lowering agent  Our clinical pharmacist, Dr. Elana Alm has also been assisting Randy Orr is now taking Metformin 500 mg, 1 AM and 1.5 tablets p.m. Most recent A1c showed good control  He is bothered some by some GI side effects/diarrhea. This can cause issues in the morning, makes it more difficult for him to walk in the morning as he would like. 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 Left knee pain 06/06/2016  . Peripheral sensory neuropathy due to type 2 diabetes mellitus ([place]) 05/17/2014   . Snoring 05/17/2014  . Obesity 05/17/2014  . Diabetes mellitus ([place]) 09/23/2011  . Hypogonadism male 09/23/2011  . Hyperlipidemia 09/23/2011    Past Medical History:  Diagnosis Date  . Diabetes mellitus without complication ([place])    DM type 2  . GERD (gastroesophageal reflux disease)   . Hyperlipidemia   . Low testosterone   . Obesity 05/17/2014  . Peripheral sensory neuropathy due to type 2 diabetes mellitus ([place]) 05/17/2014  . Snoring 05/17/2014    Past Surgical History:  Procedure Laterality Date  . COLONOSCOPY  07/18/2014    Social History   Tobacco Use  . Smoking status: Former Research scientist (life sciences)  . Smokeless tobacco: Never Used  . Tobacco comment: smoked 30 years ago.  Substance Use Topics  . Alcohol use: Yes    Alcohol/week: 5.0 standard drinks    Types: 5 Glasses of wine per week  . Drug use: No    Family History  Problem Relation Age of Onset  . Diabetes Brother   . Heart attack Father   . Colon cancer Neg Hx   . Esophageal cancer Neg Hx   . Rectal cancer Neg Hx   . Stomach cancer Neg Hx     Allergies  Allergen Reactions  . Statins     We have tried several, cannot tolerate     Medication list has been reviewed and updated.  Current Outpatient Medications on  File Prior to Visit  Medication Sig Dispense Refill  . Coenzyme Q10 (CO Q 10 PO) Take 1 tablet by mouth daily.    . cyclobenzaprine (FLEXERIL) 10 MG tablet Take 1 tablet (10 mg total) by mouth daily as needed for muscle spasms. 30 tablet 0  . l-methylfolate-B6-B12 (METANX) 3-35-2 MG TABS Take 1 tablet by mouth daily. 60 tablet 1  . metFORMIN (GLUCOPHAGE) 500 MG tablet TAKE 1 TABLET BY MOUTH EVERY MORNING AND TAKE 1.5 TABLETS EVERY EVENING 225 tablet 3  . Multiple Vitamin (MULTIVITAMIN) tablet Take 1 tablet by mouth daily.    Marland Kitchen NEEDLE, DISP, 23 G (B-D HYPODERMIC NEEDLE 23GX1") 23G X 1" MISC 1 each by Does not apply route once a week. 50 each 3  . testosterone cypionate (DEPOTESTOSTERONE CYPIONATE) 200  MG/ML injection INJECT 0.75ML INTRAMUSCULARLY EVERY 14 DAYS 6 mL 0  . triamcinolone (KENALOG) 0.025 % cream Apply topically 2 (two) times daily. 80 g 2   No current facility-administered medications on file prior to visit.    Review of Systems:  As per HPI- otherwise negative.   Physical Examination: Vitals:   06/19/20 0811  BP: 114/62  Pulse: 70  Resp: 16  SpO2: 96%   Vitals:   06/19/20 0811  Weight: 216 lb (98 kg)  Height: 6\' 3"  (1.905 m)   Body mass index is 27 kg/m. Ideal Body Weight: Weight in (lb) to have BMI = 25: 199.6  GEN: no acute distress.  Minimal overweight, looks well  HEENT: Atraumatic, Normocephalic.  Ears and Nose: No external deformity. CV: RRR, No M/G/R. No JVD. No thrill. No extra heart sounds. PULM: CTA B, no wheezes, crackles, rhonchi. No retractions. No resp. distress. No accessory muscle use. ABD: S, NT, ND, +BS. No rebound. No HSM. EXTR: No c/c/e PSYCH: Normally interactive. Conversant.  Foot exam- normal   Assessment and Plan: Type 2 diabetes mellitus without complication, without long-term current use of insulin ([place]) - Plan: Comprehensive metabolic panel, Hemoglobin A1c, Microalbumin / creatinine urine ratio  Mixed hyperlipidemia - Plan: Lipid panel  Hypogonadism male - Plan: PSA, Testosterone Total,Free,Bio, Males-([facility])  Screening for prostate cancer - Plan: PSA  Dyslipidemia - Plan: Comprehensive metabolic panel  Immunization due - Plan: Pneumococcal polysaccharide vaccine 23-valent greater than or equal to 2yo subcutaneous/IM  Screening, deficiency anemia, iron - Plan: CBC    Randy Orr is here today for follow-up visit Labs are pending as above Check testosterone level, PSA, CBC Update pneumonia vaccine Encouraged continued healthy diet and exercise plan. If possible, may adjust/lower his Metformin dose to decrease side effects  This visit occurred during the SARS-CoV-2 public health emergency.  Safety protocols were in  place, including screening questions prior to the visit, additional usage of staff PPE, and extensive cleaning of exam room while observing appropriate contact time as indicated for disinfecting solutions.    Signed Lamar Blinks, MD  Received labs as below, message to pt  Results for orders placed or performed in visit on 06/19/20  CBC  Result Value Ref Range   WBC 5.2 4.0 - 10.5 K/uL   RBC 4.80 4.22 - 5.81 Mil/uL   Platelets 230.0 150 - 400 K/uL   Hemoglobin 15.0 13.0 - 17.0 g/dL   HCT 44.9 39 - 52 %   MCV 93.5 78.0 - 100.0 fl   MCHC 33.5 30.0 - 36.0 g/dL   RDW 12.9 11.5 - 15.5 %  Comprehensive metabolic panel  Result Value Ref Range   Sodium 139 135 - 145  mEq/L   Potassium 4.7 3.5 - 5.1 mEq/L   Chloride 102 96 - 112 mEq/L   CO2 31 19 - 32 mEq/L   Glucose, Bld 140 (H) 70 - 99 mg/dL   BUN 14 6 - 23 mg/dL   Creatinine, Ser 1.16 0.40 - 1.50 mg/dL   Total Bilirubin 0.6 0.2 - 1.2 mg/dL   Alkaline Phosphatase 50 39 - 117 U/L   AST 21 0 - 37 U/L   ALT 31 0 - 53 U/L   Total Protein 6.3 6.0 - 8.3 g/dL   Albumin 4.3 3.5 - 5.2 g/dL   GFR 65.31 >60.00 mL/min   Calcium 9.2 8.4 - 10.5 mg/dL  Hemoglobin A1c  Result Value Ref Range   Hgb A1c MFr Bld 7.0 (H) 4.6 - 6.5 %  Lipid panel  Result Value Ref Range   Cholesterol 187 0 - 200 mg/dL   Triglycerides 123.0 0 - 149 mg/dL   HDL 45.20 >39.00 mg/dL   VLDL 24.6 0.0 - 40.0 mg/dL   LDL Cholesterol 117 (H) 0 - 99 mg/dL   Total CHOL/HDL Ratio 4    NonHDL 141.83   PSA  Result Value Ref Range   PSA 2.77 0.10 - 4.00 ng/mL  Microalbumin / creatinine urine ratio  Result Value Ref Range   Microalb, Ur 0.9 0.0 - 1.9 mg/dL   Creatinine,U 176.6 mg/dL   Microalb Creat Ratio 0.5 0.0 - 30.0 mg/g

## 2020-06-17 NOTE — Patient Instructions (Addendum)
It was great to see you again today, take care Assuming labs look good we can plan for 6 months  You got your pneumonia booster today We will see about lowering your metformin dose to reduce risk of unexpected GI side effects!   Health Maintenance After Age 67 After age 59, you are at a higher risk for certain long-term diseases and infections as well as injuries from falls. Falls are a major cause of broken bones and head injuries in people who are older than age 68. Getting regular preventive care can help to keep you healthy and well. Preventive care includes getting regular testing and making lifestyle changes as recommended by your health care provider. Talk with your health care provider about:  Which screenings and tests you should have. A screening is a test that checks for a disease when you have no symptoms.  A diet and exercise plan that is right for you. What should I know about screenings and tests to prevent falls? Screening and testing are the best ways to find a health problem early. Early diagnosis and treatment give you the best chance of managing medical conditions that are common after age 58. Certain conditions and lifestyle choices may make you more likely to have a fall. Your health care provider may recommend:  Regular vision checks. Poor vision and conditions such as cataracts can make you more likely to have a fall. If you wear glasses, make sure to get your prescription updated if your vision changes.  Medicine review. Work with your health care provider to regularly review all of the medicines you are taking, including over-the-counter medicines. Ask your health care provider about any side effects that may make you more likely to have a fall. Tell your health care provider if any medicines that you take make you feel dizzy or sleepy.  Osteoporosis screening. Osteoporosis is a condition that causes the bones to get weaker. This can make the bones weak and cause them to  break more easily.  Blood pressure screening. Blood pressure changes and medicines to control blood pressure can make you feel dizzy.  Strength and balance checks. Your health care provider may recommend certain tests to check your strength and balance while standing, walking, or changing positions.  Foot health exam. Foot pain and numbness, as well as not wearing proper footwear, can make you more likely to have a fall.  Depression screening. You may be more likely to have a fall if you have a fear of falling, feel emotionally low, or feel unable to do activities that you used to do.  Alcohol use screening. Using too much alcohol can affect your balance and may make you more likely to have a fall. What actions can I take to lower my risk of falls? General instructions  Talk with your health care provider about your risks for falling. Tell your health care provider if: ? You fall. Be sure to tell your health care provider about all falls, even ones that seem minor. ? You feel dizzy, sleepy, or off-balance.  Take over-the-counter and prescription medicines only as told by your health care provider. These include any supplements.  Eat a healthy diet and maintain a healthy weight. A healthy diet includes low-fat dairy products, low-fat (lean) meats, and fiber from whole grains, beans, and lots of fruits and vegetables. Home safety  Remove any tripping hazards, such as rugs, cords, and clutter.  Install safety equipment such as grab bars in bathrooms and safety rails on  stairs.  Keep rooms and walkways well-lit. Activity   Follow a regular exercise program to stay fit. This will help you maintain your balance. Ask your health care provider what types of exercise are appropriate for you.  If you need a cane or walker, use it as recommended by your health care provider.  Wear supportive shoes that have nonskid soles. Lifestyle  Do not drink alcohol if your health care provider tells  you not to drink.  If you drink alcohol, limit how much you have: ? 0-1 drink a day for women. ? 0-2 drinks a day for men.  Be aware of how much alcohol is in your drink. In the [place] one drink equals one typical bottle of beer (12 oz), one-half glass of wine (5 oz), or one shot of hard liquor (1 oz).  Do not use any products that contain nicotine or tobacco, such as cigarettes and e-cigarettes. If you need help quitting, ask your health care provider. Summary  Having a healthy lifestyle and getting preventive care can help to protect your health and wellness after age 53.  Screening and testing are the best way to find a health problem early and help you avoid having a fall. Early diagnosis and treatment give you the best chance for managing medical conditions that are more common for people who are older than age 61.  Falls are a major cause of broken bones and head injuries in people who are older than age 53. Take precautions to prevent a fall at home.  Work with your health care provider to learn what changes you can make to improve your health and wellness and to prevent falls. This information is not intended to replace advice given to you by your health care provider. Make sure you discuss any questions you have with your health care provider. Document Revised: 11/19/2018 Document Reviewed: 06/11/2017 Elsevier Patient Education  2020 [employer].

## 2020-06-19 ENCOUNTER — Ambulatory Visit (INDEPENDENT_AMBULATORY_CARE_PROVIDER_SITE_OTHER): Payer: Medicare HMO | Admitting: Family Medicine

## 2020-06-19 ENCOUNTER — Encounter: Payer: Self-pay | Admitting: Family Medicine

## 2020-06-19 ENCOUNTER — Other Ambulatory Visit: Payer: Self-pay

## 2020-06-19 VITALS — BP 114/62 | HR 70 | Resp 16 | Ht 75.0 in | Wt 216.0 lb

## 2020-06-19 DIAGNOSIS — Z125 Encounter for screening for malignant neoplasm of prostate: Secondary | ICD-10-CM

## 2020-06-19 DIAGNOSIS — Z23 Encounter for immunization: Secondary | ICD-10-CM

## 2020-06-19 DIAGNOSIS — E119 Type 2 diabetes mellitus without complications: Secondary | ICD-10-CM | POA: Diagnosis not present

## 2020-06-19 DIAGNOSIS — E785 Hyperlipidemia, unspecified: Secondary | ICD-10-CM | POA: Diagnosis not present

## 2020-06-19 DIAGNOSIS — Z130 Encounter for screening for diseases of the blood and blood-forming organs and certain disorders involving the immune mechanism: Secondary | ICD-10-CM | POA: Diagnosis not present

## 2020-06-19 DIAGNOSIS — E782 Mixed hyperlipidemia: Secondary | ICD-10-CM

## 2020-06-19 DIAGNOSIS — E291 Testicular hypofunction: Secondary | ICD-10-CM

## 2020-06-19 LAB — COMPREHENSIVE METABOLIC PANEL
ALT: 31 U/L (ref 0–53)
AST: 21 U/L (ref 0–37)
Albumin: 4.3 g/dL (ref 3.5–5.2)
Alkaline Phosphatase: 50 U/L (ref 39–117)
BUN: 14 mg/dL (ref 6–23)
CO2: 31 mEq/L (ref 19–32)
Calcium: 9.2 mg/dL (ref 8.4–10.5)
Chloride: 102 mEq/L (ref 96–112)
Creatinine, Ser: 1.16 mg/dL (ref 0.40–1.50)
GFR: 65.31 mL/min (ref >60.00)
Glucose, Bld: 140 mg/dL — ABNORMAL HIGH (ref 70–99)
Potassium: 4.7 mEq/L (ref 3.5–5.1)
Sodium: 139 mEq/L (ref 135–145)
Total Bilirubin: 0.6 mg/dL (ref 0.2–1.2)
Total Protein: 6.3 g/dL (ref 6.0–8.3)

## 2020-06-19 LAB — LIPID PANEL
Cholesterol: 187 mg/dL (ref 0–200)
HDL: 45.2 mg/dL (ref >39.00)
LDL Cholesterol: 117 mg/dL — ABNORMAL HIGH (ref 0–99)
NonHDL: 141.83
Total CHOL/HDL Ratio: 4
Triglycerides: 123 mg/dL (ref 0.0–149.0)
VLDL: 24.6 mg/dL (ref 0.0–40.0)

## 2020-06-19 LAB — MICROALBUMIN / CREATININE URINE RATIO
Creatinine,U: 176.6 mg/dL
Microalb Creat Ratio: 0.5 mg/g (ref 0.0–30.0)
Microalb, Ur: 0.9 mg/dL (ref 0.0–1.9)

## 2020-06-19 LAB — CBC
HCT: 44.9 % (ref 39.0–52.0)
Hemoglobin: 15 g/dL (ref 13.0–17.0)
MCHC: 33.5 g/dL (ref 30.0–36.0)
MCV: 93.5 fl (ref 78.0–100.0)
Platelets: 230 10*3/uL (ref 150.0–400.0)
RBC: 4.8 Mil/uL (ref 4.22–5.81)
RDW: 12.9 % (ref 11.5–15.5)
WBC: 5.2 10*3/uL (ref 4.0–10.5)

## 2020-06-19 LAB — PSA: PSA: 2.77 ng/mL (ref 0.10–4.00)

## 2020-06-19 LAB — HEMOGLOBIN A1C: Hgb A1c MFr Bld: 7 % — ABNORMAL HIGH (ref 4.6–6.5)

## 2020-06-20 ENCOUNTER — Other Ambulatory Visit: Payer: Self-pay | Admitting: Family Medicine

## 2020-06-20 LAB — TESTOSTERONE TOTAL,FREE,BIO, MALES
Albumin: 4.3 g/dL (ref 3.6–5.1)
Sex Hormone Binding: 36 nmol/L (ref 22–77)
Testosterone, Bioavailable: 142.2 ng/dL (ref >=110.0)
Testosterone, Free: 72.2 pg/mL (ref 46.0–224.0)
Testosterone: 558 ng/dL (ref 250–827)

## 2020-06-20 MED ORDER — DAPAGLIFLOZIN PROPANEDIOL 10 MG PO TABS: 10.0000 mg | ORAL_TABLET | Freq: Every day | ORAL | 6 refills | Status: DC

## 2020-06-22 ENCOUNTER — Telehealth: Payer: Self-pay | Admitting: Pharmacist

## 2020-06-22 NOTE — Progress Notes (Addendum)
     Chronic Care Management Pharmacy Assistant   Name: Randy Orr  MRN: [id number] DOB: 1951-05-03  Reason for Encounter: Medication Review  PCP : [name], MD  Allergies:   Allergies  Allergen Reactions   Statins     We have tried several, cannot tolerate     Medications: Outpatient Encounter Medications as of 06/22/2020  Medication Sig Note   Coenzyme Q10 (CO Q 10 PO) Take 1 tablet by mouth daily.    cyclobenzaprine (FLEXERIL) 10 MG tablet Take 1 tablet (10 mg total) by mouth daily as needed for muscle spasms.    dapagliflozin propanediol (FARXIGA) 10 MG TABS tablet Take 1 tablet (10 mg total) by mouth daily before breakfast. Start with 5 mg an increase to 10 as directed    l-methylfolate-B6-B12 (METANX) 3-35-2 MG TABS Take 1 tablet by mouth daily. 11/01/2019: Thrive   metFORMIN (GLUCOPHAGE) 500 MG tablet TAKE 1 TABLET BY MOUTH EVERY MORNING AND TAKE 1.5 TABLETS EVERY EVENING    Multiple Vitamin (MULTIVITAMIN) tablet Take 1 tablet by mouth daily. 11/01/2019: Centrum Silver   NEEDLE, DISP, 23 G (B-D HYPODERMIC NEEDLE 23GX1") 23G X 1" MISC 1 each by Does not apply route once a week.    testosterone cypionate (DEPOTESTOSTERONE CYPIONATE) 200 MG/ML injection INJECT 0.75ML INTRAMUSCULARLY EVERY 14 DAYS    triamcinolone (KENALOG) 0.025 % cream Apply topically 2 (two) times daily.    No facility-administered encounter medications on file as of 06/22/2020.    Current Diagnosis: Patient Active Problem List   Diagnosis Date Noted   Left knee pain 06/06/2016   Peripheral sensory neuropathy due to type 2 diabetes mellitus ([place]) 05/17/2014   Snoring 05/17/2014   Obesity 05/17/2014   Diabetes mellitus ([place]) 09/23/2011   Hypogonadism male 09/23/2011   Hyperlipidemia 09/23/2011    Goals Addressed   None    Reviewing adherence to Metformin documentation. On 06-19-20 provider changed Metformin 500 mg 1.5 daily to 500 mg daily and added Farxiga 5 mg.  Follow-Up:   Pharmacist Review   Randy Orr, [facility] [place] Pharmacist Assistant [phone]  Reviewed by: De Blanch, PharmD Clinical Pharmacist [facility] [phone]

## 2020-07-04 DIAGNOSIS — R69 Illness, unspecified: Secondary | ICD-10-CM | POA: Diagnosis not present

## 2020-07-24 ENCOUNTER — Ambulatory Visit: Payer: Medicare HMO | Admitting: Family Medicine

## 2020-08-08 ENCOUNTER — Telehealth: Payer: Self-pay | Admitting: Pharmacist

## 2020-08-08 NOTE — Progress Notes (Addendum)
Patient returned my call in regards to his Metformin medication. He states he is taking one 500 mg tab in the evening, and has been tolerating this change without side effects. He has not picked up any of this medication since October because he has an abundance suppy on hand. Requesting the PCP update the prescription to reflect how the patient is actually taking the medication.  Corwin Levins, CMA Clinical Pharmacist Assistant [phone]  Reviewed by: Katrinka Blazing, PharmD, BCACP Clinical Pharmacist [facility] [phone]

## 2020-08-08 NOTE — Progress Notes (Addendum)
Reviewing the patient's chart for medication adherence information specific to Metformin 500 MG tablet. Per the patient's chart, he is adherent with this medication and taking as directed. He did have previous GI side effects, and ask PCP to reduce the dose. OV dated 06-19-2020 notes indicate a lower dose would be evaluated. The current directions say patient is to take one tab in the morning and one and half tabs in the evening daily. I attempted to make contact with the patient to confirm his last pick up date of Metformin from [facility], but was unable to reach him.  Corwin Levins, CMA Clinical Pharmacist Assistant [phone]  Reviewed by: Katrinka Blazing, PharmD, BCACP Clinical Pharmacist [facility] at [facility] [phone]

## 2020-08-09 ENCOUNTER — Telehealth: Payer: Self-pay

## 2020-08-09 NOTE — Telephone Encounter (Signed)
-----   Message from Lacie Scotts sent at 08/08/2020  3:54 PM EST ----- Regarding: Updated Prescription Good afternoon. Would you please ask Dr. Patsy Lager to update the patients prescription to read : Metformin 500 mg one tab daily. I am working on his adherence data, and he informed me that the dosage changed last month due to some GI issues he was having. The current prescription in his chart still says one tab in the morning and one and half tabs in the evening daily.  Thank you Corwin Levins, [facility] Clinical Pharmacist Assistant [phone]

## 2020-08-09 NOTE — Addendum Note (Signed)
Addended by: Steve Rattler A on: 08/09/2020 10:42 AM   Modules accepted: Orders

## 2020-08-09 NOTE — Telephone Encounter (Signed)
Thanks Randy Orr!! I have updated his chart. Did he need a refill too or just for information to be updated?

## 2020-08-29 ENCOUNTER — Other Ambulatory Visit: Payer: Self-pay | Admitting: Family Medicine

## 2020-08-29 DIAGNOSIS — E291 Testicular hypofunction: Secondary | ICD-10-CM

## 2020-08-29 NOTE — Chronic Care Management (AMB) (Deleted)
Chronic Care Management Pharmacy  Name: Arval Brandstetter  MRN: [id number] DOB: 01-28-1951  Chief Complaint/ HPI  Randy Orr,  70 y.o. , male presents for their Follow-Up CCM visit with the clinical pharmacist via telephone due to COVID-19 Pandemic.  PCP : [name], MD  Their chronic conditions include: Diabetes, HLD, Low Testosterone, Back Pain  Office Visits: 06/19/20 ([place]) - patient is reporting some side effects from the Metformin including GI.diarrhea.  Switched to 500mg  daily with breakfast.  Also Wilder Glade was added to his metformin  Consult Visit: None since last CCM visit on 01/31/20.  Medications: Outpatient Encounter Medications as of 09/01/2020  Medication Sig Note  . Coenzyme Q10 (CO Q 10 PO) Take 1 tablet by mouth daily.   . cyclobenzaprine (FLEXERIL) 10 MG tablet Take 1 tablet (10 mg total) by mouth daily as needed for muscle spasms.   . dapagliflozin propanediol (FARXIGA) 10 MG TABS tablet Take 1 tablet (10 mg total) by mouth daily before breakfast. Start with 5 mg an increase to 10 as directed   . l-methylfolate-B6-B12 (METANX) 3-35-2 MG TABS Take 1 tablet by mouth daily. 11/01/2019: Thrive  . metFORMIN (GLUCOPHAGE) 500 MG tablet Take 500 mg by mouth daily with breakfast.   . Multiple Vitamin (MULTIVITAMIN) tablet Take 1 tablet by mouth daily. 11/01/2019: Centrum Silver  . NEEDLE, DISP, 23 G (B-D HYPODERMIC NEEDLE 23GX1") 23G X 1" MISC 1 each by Does not apply route once a week.   . testosterone cypionate (DEPOTESTOSTERONE CYPIONATE) 200 MG/ML injection INJECT 0.75ML INTRAMUSCULARLY EVERY 14 DAYS   . triamcinolone (KENALOG) 0.025 % cream Apply topically 2 (two) times daily.   . [DISCONTINUED] testosterone cypionate (DEPOTESTOSTERONE CYPIONATE) 200 MG/ML injection INJECT 0.75ML INTRAMUSCULARLY EVERY 14 DAYS    No facility-administered encounter medications on file as of 09/01/2020.   SDOH Screenings   Alcohol Screen: Not on file  Depression (PHQ2-9): Low  Risk   . PHQ-2 Score: 0  Financial Resource Strain: Not on file  Food Insecurity: Not on file  Housing: Not on file  Physical Activity: Not on file  Social Connections: Not on file  Stress: Not on file  Tobacco Use: Medium Risk  . Smoking Tobacco Use: Former Smoker  . 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   Diabetes   Recent Relevant Labs: Lab Results  Component Value Date/Time   HGBA1C 7.0 (H) 06/19/2020 08:45 AM   HGBA1C 6.7 (H) 11/04/2019 09:27 AM   MICROALBUR 0.9 06/19/2020 08:46 AM   MICROALBUR 1.0 07/01/2019 09:04 AM    Goal a1c <7%  Checking BG: Never Does not have glucometer supplies. He does not like poking himself. He passed out his first time attempting to give himself his testosterone shot  Patient is currently controlled and improved from previous on the following medications:   Metformin 500mg  with breakfast  From 11/01/19 CCM Visit Gets diarrhea with taking 3 tabs daily, cut back to 2 (states that a1c of 7.6% was at a dose of 2 daily).  Can take 1AM + 1.5HS The pandemic caused him to eat lots of comfort food, cookies, etc. Patient has made drastic changes to his diet. Stopped eating saturated fat.  Drinked 5 nights per week February -  November.  November was a wake up call for him.  Has lost 8-11lbs since November. Has been doing intermittent fasting.   Last diabetic Eye exam:  Lab Results  Component Value Date/Time   HMDIABEYEEXA No Retinopathy 10/30/2016 04:34 PM    Last diabetic Foot exam: No results found for: HMDIABFOOTEX (completed on 07/01/19?)  We discussed: Possibility of using extended release metformin if increased dose needed   Update 01/31/20 Congratulated patient on a1c  improvement.  States he is walking more and has lost weight.  Update 06/01/20 Has cut down on his beer consumption. Drank 1 beer 2-3 days. Need to update metformin script. Discussed possibility of extended release metformin if titration is needed. Would benefit from updated a1c. Need to get patient  How many metformin remaining? Last filled 05/02/20  Plan -Continue current medications  -Update script for metformin -Follow up with Dr. Lorelei Pont for updated labs   Future Plan -Titrate metformin with extended release formulation if needed   Hyperlipidemia   Lipid Panel     Component Value Date/Time   CHOL 187 06/19/2020 0845   TRIG 123.0 06/19/2020 0845   HDL 45.20 06/19/2020 0845   CHOLHDL 4 06/19/2020 0845   VLDL 24.6 06/19/2020 0845   LDLCALC 117 (H) 06/19/2020 0845   LDLDIRECT 146.0 07/01/2019 0904    LDL goal <100  The 10-year ASCVD risk score Mikey Bussing DC Jr., et al., 2013) is: 22.4%   Values used to calculate the score:     Age: 73 years     Sex: Male     Is Non-Hispanic African American: No     Diabetic: Yes     Tobacco smoker: No     Systolic Blood Pressure: 99991111 mmHg     Is BP treated: No     HDL Cholesterol: 45.2 mg/dL     Total Cholesterol: 187 mg/dL   Patient has failed these meds in past: atorvastatin (statin myopathy) Patient is currently uncontrolled on the following medications:  Cholestoff - 900mg  of sterols  Coenzyme Q10 daily  Fish oil 1200mg  2AM, 1PM   From 11/02/19 CCM Visit Rosuvastatin: Took once every 3 days and felt horrible, so he stopped taking it.  CoQ10: Did not feel that it helped Stopped eating bacon Eggs once every 2 weeks vs daily Taking 3600mg  of fish oil daily. Started fish oil around early December.  Wonders if taking 4g of fish oil daily will help with his cholesterol.  Cholestoff - 900mg  of sterols   Father passed away due to heart attack at age 43.   Concerned with cost of statin alternative  We discussed:  significance  of his elevated lipid panel and statin alternatives (ezetimibe, fish oil)   Update 01/31/20 Congratulated patient on weight loss and LDL improvement. Reinforced LDL goal. Feels he was not doing as much walking when he had labs drawn, but he has increased. States his goal is to do 3.5 miles daily. Patient would like to continue lifestyle modifications before adding ezetimibe. Noting his great improvement, I'm agreeable with this plan.  Update 06/01/20 Discussed ezetimibe if LDL >100 at next lab visit. Patient agreeable to this. Would benefit from updated LDL. If LDL >100 initiate ezetimibe 10mg  daily.  Plan -Continue current medications -Followup with Dr. Lorelei Pont for updated LDL  Future Plan -Consider initiation of ezetimibe 10mg  daily since most recent lipid panel had LDL >100  Low Testosterone   11/04/19:  Testosterone - 821 Free Testosterone - 129.6 Bioavailable Testosterone - 129.6  Patient  has failed these meds in past: None noted  Patient is currently controlled on the following medications:   Testosterone cypionate 200mg /mL 0.64mL every 2 weeks  From 11/02/19 CCM Visit Does not feel like he crashes after 2 weeks (felt this way with 8mL dose) Tried to get patches covered, but were expensive. Ok to continue injection. Wife injects for him.   Update 01/31/20 He states he is still tolerating his current dose. The two days prior to his injection he experiences mood swings.   Update 06/01/20 Still taking and tolerating. Aware he is due for follow up visit.  Update 09/01/20 Testosterone - 558 Free Testosterone - 72.2 Bioavailable Testosterone 142.2  Plan -Continue current medications  -Follow up with Dr. Lorelei Pont for updated labs  Back Pain    Patient has failed these meds in past: None noted  Patient is currently controlled on the following medications:   Cyclobenzaprine 10mg  PRN   From 11/02/19 CCM Visit Takes about 1 tablet per year States chiropractor states he  has diminished cartilage in his spine. He was tilting to right from his hips up He was readjusted and this really improved his situation Was going to chiropractor Q4 weeks, then stopped when pandemic occurred.  Started feeling tight, took a cyclobenzaprine. Got readjusted again last week. This has improved symptoms  We discussed the importance of only using prescribed medications  Update 01/31/20 He is re-engaged with chiropractic care. He has a stretching regimen that "7/8 times gets my back to pop without going to the chiropractor". States he is going around once a month to the chiropractor.   Cyclobenzaprine: Has not used since getting prescribed.  Update 06/01/20 Pain comes and goes. Brother had a spinal fusion. Wonders if this is genetic.  Plan -Continue current medications  Vaccines   Reviewed and discussed patient's vaccination history.    Immunization History  Administered Date(s) Administered  . Influenza, High Dose Seasonal PF 06/06/2018, 04/27/2019  . Influenza,inj,Quad PF,6+ Mos 05/16/2014  . Influenza-Unspecified 05/09/2020  . PFIZER(Purple Top)SARS-COV-2 Vaccination 09/03/2019, 09/24/2019, 05/09/2020  . PPD Test 09/23/2011  . Pneumococcal Conjugate-13 10/08/2018  . Pneumococcal Polysaccharide-23 10/20/2013, 06/19/2020  . Tdap 10/20/2013  . Zoster Recombinat (Shingrix) 06/05/2020   Covid Vaccine:  Pfizer - 1st 2 vaccines at [employer], booster at [facility] 09/03/19, 09/24/19, 05/09/20  Flu Vaccine: 05/09/20  Plan -Recommended patient receive Shingrix vaccine in pharmacy.  SHINGRIX DOSE #2!!!  Miscellaneous Meds Triamcinolone 0.025% cream (for "winter skin") Multivitamin daily l-methylfolate B6-B12 (Thrive) D3 2000 units #2 gummie daily Areds Preservision #1 tablet daily Alpha lipoic acid 600mg  1 daily  Beverly Milch, PharmD Clinical Pharmacist [place] [phone]

## 2020-08-31 ENCOUNTER — Telehealth: Payer: Self-pay | Admitting: Pharmacist

## 2020-08-31 NOTE — Progress Notes (Signed)
    Chronic Care Management Pharmacy Assistant   Name: Cameron Schwinn  MRN: [id number] DOB: [birth year]  Reason for Encounter: Adherence Review  Patient Questions:  1.  Have you seen any other providers since your last visit? No.   2.  Any changes in your medicines or health? No.   PCP : [place], Gay Filler, MD  Verified Adherence Gap Information. Per insurance data, the patient last A1C was 6.7 on 11/04/19. Patients last blood pressure was 124/81 on 11/04/19. Patients total gap measures are 5. Patents total gap star measures are 3.  Follow-Up:  Pharmacist Review

## 2020-09-01 ENCOUNTER — Telehealth: Payer: Medicare HMO

## 2020-09-14 ENCOUNTER — Telehealth: Payer: Self-pay | Admitting: Pharmacist

## 2020-09-14 NOTE — Progress Notes (Addendum)
Chronic Care Management Pharmacy Assistant   Name: Densil Ottey  MRN: [id number] DOB: Jan 11, 1951  Reason for Encounter: Disease State For DM.  Patient Questions:  1.  Have you seen any other providers since your last visit? No.   2.  Any changes in your medicines or health? No    PCP : [place], Gay Filler, MD  Their chronic conditions include: Diabetes, HLD, Low Testosterone, Back Pain.  Office Visits:  Consults:  Allergies:   Allergies  Allergen Reactions   Statins     We have tried several, cannot tolerate     Medications: Outpatient Encounter Medications as of 09/14/2020  Medication Sig Note   Coenzyme Q10 (CO Q 10 PO) Take 1 tablet by mouth daily.    cyclobenzaprine (FLEXERIL) 10 MG tablet Take 1 tablet (10 mg total) by mouth daily as needed for muscle spasms.    dapagliflozin propanediol (FARXIGA) 10 MG TABS tablet Take 1 tablet (10 mg total) by mouth daily before breakfast. Start with 5 mg an increase to 10 as directed    l-methylfolate-B6-B12 (METANX) 3-35-2 MG TABS Take 1 tablet by mouth daily. 11/01/2019: Thrive   metFORMIN (GLUCOPHAGE) 500 MG tablet Take 500 mg by mouth daily with breakfast.    Multiple Vitamin (MULTIVITAMIN) tablet Take 1 tablet by mouth daily. 11/01/2019: Centrum Silver   NEEDLE, DISP, 23 G (B-D HYPODERMIC NEEDLE 23GX1") 23G X 1" MISC 1 each by Does not apply route once a week.    testosterone cypionate (DEPOTESTOSTERONE CYPIONATE) 200 MG/ML injection INJECT 0.75ML INTRAMUSCULARLY EVERY 14 DAYS    triamcinolone (KENALOG) 0.025 % cream Apply topically 2 (two) times daily.    No facility-administered encounter medications on file as of 09/14/2020.    Current Diagnosis: Patient Active Problem List   Diagnosis Date Noted   Left knee pain 06/06/2016   Peripheral sensory neuropathy due to type 2 diabetes mellitus ([place]) 05/17/2014   Snoring 05/17/2014   Obesity 05/17/2014   Diabetes mellitus ([place]) 09/23/2011   Hypogonadism male 09/23/2011    Hyperlipidemia 09/23/2011    Goals Addressed   None    Recent Relevant Labs: Lab Results  Component Value Date/Time   HGBA1C 7.0 (H) 06/19/2020 08:45 AM   HGBA1C 6.7 (H) 11/04/2019 09:27 AM   MICROALBUR 0.9 06/19/2020 08:46 AM   MICROALBUR 1.0 07/01/2019 09:04 AM    Kidney Function Lab Results  Component Value Date/Time   CREATININE 1.16 06/19/2020 08:45 AM   CREATININE 1.10 07/01/2019 09:04 AM   CREATININE 1.12 03/01/2015 09:14 AM   CREATININE 1.15 05/16/2014 08:45 AM   GFR 65.31 06/19/2020 08:45 AM   GFRNONAA 71 03/01/2015 09:14 AM   GFRAA 82 03/01/2015 09:14 AM    Current antihyperglycemic regimen:  Metformin 500 mg Take 500 mg by mouth daily at night. Farxiga 10 mg Take 1 tablet (10 mg total) by mouth daily before breakfast. Start with 5 mg an increase to 10 as directed  What recent interventions/DTPs have been made to improve glycemic control:  None.  Have there been any recent hospitalizations or ED visits since last visit with CPP? No.  Patient reports hypoglycemic symptoms, including None   Patient reports hyperglycemic symptoms, including none   How often are you checking your blood sugar? Patient stated he doesn't check his sugar at home, patients choice.  What are your blood sugars ranging?  Fasting: N/A Before meals: N/A After meals: N/A Bedtime: N/A  During the week, how often does your blood glucose drop below 70?  Patient stated he doesn't check his sugar at home, patients choice.  Are you checking your feet daily/regularly?  Patient stated his wife checks his feet every night.   Adherence Review: Is the patient currently on a STATIN medication? No.   Is the patient currently on ACE/ARB medication? No.  Does the patient have >5 day gap between last estimated fill dates? Gap in [place] fills because patient was tapering up starting with 5mg  (half tablet) will recheck adherence at next follow up.   Follow-Up:  Pharmacist Review   Charlann Lange, RMA Clinical Pharmacist Assistant [phone]  5 minutes spent in review, coordination, and documentation.  Reviewed by: Beverly Milch, PharmD Clinical Pharmacist [facility] [phone]

## 2020-09-27 ENCOUNTER — Encounter: Payer: Self-pay | Admitting: Family Medicine

## 2020-09-27 DIAGNOSIS — G720 Drug-induced myopathy: Secondary | ICD-10-CM | POA: Insufficient documentation

## 2020-10-06 ENCOUNTER — Telehealth: Payer: Self-pay | Admitting: Pharmacist

## 2020-10-06 NOTE — Progress Notes (Signed)
    Chronic Care Management Pharmacy Assistant   Name: Randy Orr  MRN: [id number] DOB: 06-14-1951  Reason for Encounter: Adherence Review  PCP : [name], MD  Allergies:   Allergies  Allergen Reactions  . Statins     We have tried several, cannot tolerate     Medications: Outpatient Encounter Medications as of 10/06/2020  Medication Sig Note  . Coenzyme Q10 (CO Q 10 PO) Take 1 tablet by mouth daily.   . cyclobenzaprine (FLEXERIL) 10 MG tablet Take 1 tablet (10 mg total) by mouth daily as needed for muscle spasms.   . dapagliflozin propanediol (FARXIGA) 10 MG TABS tablet Take 1 tablet (10 mg total) by mouth daily before breakfast. Start with 5 mg an increase to 10 as directed   . l-methylfolate-B6-B12 (METANX) 3-35-2 MG TABS Take 1 tablet by mouth daily. 11/01/2019: Thrive  . metFORMIN (GLUCOPHAGE) 500 MG tablet Take 500 mg by mouth daily with breakfast.   . Multiple Vitamin (MULTIVITAMIN) tablet Take 1 tablet by mouth daily. 11/01/2019: Centrum Silver  . NEEDLE, DISP, 23 G (B-D HYPODERMIC NEEDLE 23GX1") 23G X 1" MISC 1 each by Does not apply route once a week.   . testosterone cypionate (DEPOTESTOSTERONE CYPIONATE) 200 MG/ML injection INJECT 0.75ML INTRAMUSCULARLY EVERY 14 DAYS   . triamcinolone (KENALOG) 0.025 % cream Apply topically 2 (two) times daily.    No facility-administered encounter medications on file as of 10/06/2020.    Current Diagnosis: Patient Active Problem List   Diagnosis Date Noted  . Statin myopathy 09/27/2020  . Left knee pain 06/06/2016  . Peripheral sensory neuropathy due to type 2 diabetes mellitus ([place]) 05/17/2014  . Snoring 05/17/2014  . Obesity 05/17/2014  . Diabetes mellitus ([place]) 09/23/2011  . Hypogonadism male 09/23/2011  . Hyperlipidemia 09/23/2011    Goals Addressed   None    Reviewed the patients chart for any health and/or medication changes since the last CMM visit on 09/14/20, there were not any changes at this  time.  Follow-Up:  Pharmacist Review   Charlann Lange, [place] Pharmacist Assistant [phone]

## 2020-11-13 DIAGNOSIS — H9313 Tinnitus, bilateral: Secondary | ICD-10-CM | POA: Diagnosis not present

## 2020-11-13 DIAGNOSIS — Z822 Family history of deafness and hearing loss: Secondary | ICD-10-CM | POA: Diagnosis not present

## 2020-11-13 DIAGNOSIS — Z570 Occupational exposure to noise: Secondary | ICD-10-CM | POA: Diagnosis not present

## 2020-11-13 DIAGNOSIS — E119 Type 2 diabetes mellitus without complications: Secondary | ICD-10-CM | POA: Diagnosis not present

## 2020-11-13 DIAGNOSIS — H903 Sensorineural hearing loss, bilateral: Secondary | ICD-10-CM | POA: Diagnosis not present

## 2020-11-21 ENCOUNTER — Other Ambulatory Visit: Payer: Self-pay | Admitting: Family Medicine

## 2020-11-21 DIAGNOSIS — E119 Type 2 diabetes mellitus without complications: Secondary | ICD-10-CM

## 2020-12-27 ENCOUNTER — Telehealth: Payer: Self-pay | Admitting: Pharmacist

## 2020-12-27 NOTE — Chronic Care Management (AMB) (Signed)
    Chronic Care Management Pharmacy Assistant   Name: Randy Orr  MRN: [id number] DOB: 1951/07/09   Reason for Encounter: Disease State Diabetes Mellitus    Recent office visits:  None noted  Recent consult visits:  None noted  Hospital visits:  None in previous 6 months  Medications: Outpatient Encounter Medications as of 12/27/2020  Medication Sig Note  . Coenzyme Q10 (CO Q 10 PO) Take 1 tablet by mouth daily.   . cyclobenzaprine (FLEXERIL) 10 MG tablet Take 1 tablet (10 mg total) by mouth daily as needed for muscle spasms.   . dapagliflozin propanediol (FARXIGA) 10 MG TABS tablet Take 1 tablet (10 mg total) by mouth daily before breakfast. Start with 5 mg an increase to 10 as directed   . l-methylfolate-B6-B12 (METANX) 3-35-2 MG TABS Take 1 tablet by mouth daily. 11/01/2019: Thrive  . metFORMIN (GLUCOPHAGE) 500 MG tablet TAKE 1 TABLET BY MOUTH EVERY MORNING AND TAKE 2 TABLETS EVERY EVENING   . Multiple Vitamin (MULTIVITAMIN) tablet Take 1 tablet by mouth daily. 11/01/2019: Centrum Silver  . NEEDLE, DISP, 23 G (B-D HYPODERMIC NEEDLE 23GX1") 23G X 1" MISC 1 each by Does not apply route once a week.   . testosterone cypionate (DEPOTESTOSTERONE CYPIONATE) 200 MG/ML injection INJECT 0.75ML INTRAMUSCULARLY EVERY 14 DAYS   . triamcinolone (KENALOG) 0.025 % cream Apply topically 2 (two) times daily.    No facility-administered encounter medications on file as of 12/27/2020.   Recent Relevant Labs: Lab Results  Component Value Date/Time   HGBA1C 7.0 (H) 06/19/2020 08:45 AM   HGBA1C 6.7 (H) 11/04/2019 09:27 AM   MICROALBUR 0.9 06/19/2020 08:46 AM   MICROALBUR 1.0 07/01/2019 09:04 AM    Kidney Function Lab Results  Component Value Date/Time   CREATININE 1.16 06/19/2020 08:45 AM   CREATININE 1.10 07/01/2019 09:04 AM   CREATININE 1.12 03/01/2015 09:14 AM   CREATININE 1.15 05/16/2014 08:45 AM   GFR 65.31 06/19/2020 08:45 AM   GFRNONAA 71 03/01/2015 09:14 AM   GFRAA 82  03/01/2015 09:14 AM    . Current antihyperglycemic regimen:   Metformin HCL 500 MG Take 1 tab in the morning 2 tab in the evening   What recent interventions/DTPs have been made to improve glycemic control:  o None noted  . Have there been any recent hospitalizations or ED visits since last visit with CPP? No   . Patient denies hypoglycemic symptoms, including Pale, Sweaty, Shaky, Hungry, Nervous/irritable and Vision changes   . Patient denies hyperglycemic symptoms, including blurry vision, excessive thirst, fatigue, polyuria and weakness   . How often are you checking your blood sugar?   Patient states he does not check his blood sugars he only check his blood sugar when he goes into his PCP office.  . What are your blood sugars ranging?  o Fasting:  o Before meals:  o After meals:  o Bedtime:  . During the week, how often does your blood glucose drop below 70? Never   . Are you checking your feet daily/regularly?   Patient states he does check his feet daily  Adherence Review: Is the patient currently on a STATIN medication? No Is the patient currently on ACE/ARB medication? No Does the patient have >5 day gap between last estimated fill dates? No   Star Rating Drugs: Metformin 500 mg Last filled:11/21/2020 90 DS  [facility] Clinical Pharmacist Assistant [phone]

## 2021-01-01 DIAGNOSIS — Z20822 Contact with and (suspected) exposure to covid-19: Secondary | ICD-10-CM | POA: Diagnosis not present

## 2021-01-02 DIAGNOSIS — Z20822 Contact with and (suspected) exposure to covid-19: Secondary | ICD-10-CM | POA: Diagnosis not present

## 2021-01-30 DIAGNOSIS — R509 Fever, unspecified: Secondary | ICD-10-CM | POA: Diagnosis not present

## 2021-01-30 DIAGNOSIS — R059 Cough, unspecified: Secondary | ICD-10-CM | POA: Diagnosis not present

## 2021-01-30 DIAGNOSIS — J069 Acute upper respiratory infection, unspecified: Secondary | ICD-10-CM | POA: Diagnosis not present

## 2021-01-30 DIAGNOSIS — M791 Myalgia, unspecified site: Secondary | ICD-10-CM | POA: Diagnosis not present

## 2021-01-30 DIAGNOSIS — R0981 Nasal congestion: Secondary | ICD-10-CM | POA: Diagnosis not present

## 2021-01-30 DIAGNOSIS — Z20822 Contact with and (suspected) exposure to covid-19: Secondary | ICD-10-CM | POA: Diagnosis not present

## 2021-01-30 DIAGNOSIS — R499 Unspecified voice and resonance disorder: Secondary | ICD-10-CM | POA: Diagnosis not present

## 2021-01-31 ENCOUNTER — Encounter: Payer: Self-pay | Admitting: Family Medicine

## 2021-01-31 DIAGNOSIS — U071 COVID-19: Secondary | ICD-10-CM

## 2021-02-01 MED ORDER — NIRMATRELVIR/RITONAVIR (PAXLOVID)TABLET: 3.0000 | ORAL_TABLET | Freq: Two times a day (BID) | ORAL | 0 refills | Status: AC

## 2021-02-04 ENCOUNTER — Other Ambulatory Visit: Payer: Self-pay | Admitting: Family Medicine

## 2021-02-04 DIAGNOSIS — E291 Testicular hypofunction: Secondary | ICD-10-CM

## 2021-02-04 MED ORDER — TESTOSTERONE CYPIONATE 200 MG/ML IM SOLN: INTRAMUSCULAR | 0 refills | Status: DC

## 2021-02-27 ENCOUNTER — Ambulatory Visit (INDEPENDENT_AMBULATORY_CARE_PROVIDER_SITE_OTHER): Payer: Medicare HMO | Admitting: Pharmacist

## 2021-02-27 DIAGNOSIS — T466X5A Adverse effect of antihyperlipidemic and antiarteriosclerotic drugs, initial encounter: Secondary | ICD-10-CM

## 2021-02-27 DIAGNOSIS — E291 Testicular hypofunction: Secondary | ICD-10-CM

## 2021-02-27 DIAGNOSIS — E119 Type 2 diabetes mellitus without complications: Secondary | ICD-10-CM

## 2021-02-27 DIAGNOSIS — M791 Myalgia, unspecified site: Secondary | ICD-10-CM

## 2021-02-27 DIAGNOSIS — E782 Mixed hyperlipidemia: Secondary | ICD-10-CM | POA: Diagnosis not present

## 2021-02-27 NOTE — Patient Instructions (Addendum)
Visit Information  PATIENT GOALS:  Goals Addressed             This Visit's Progress    Chronic Care Management Pharmacy Care   On track    CARE PLAN ENTRY (see longitudinal plan of care for additional care plan information)  Current Barriers:  Chronic Disease Management support, education, and care coordination needs related to Diabetes, HLD, Low Testosterone, Back Pain   Hyperlipidemia Lab Results  Component Value Date/Time   LDLCALC 117 (H) 06/19/2020 08:45 AM   LDLDIRECT 146.0 07/01/2019 09:04 AM  Pharmacist Clinical Goal(s): Over the next 90 days, patient will work with PharmD and providers to achieve LDL goal < 100 Current regimen:  Cholestoff - 900mg  of sterols Coenzyme Q10 daily Fish oil 1200mg  2AM, 1PM  Interventions: Discussed LDL goals and importance of reducing risk of heart attack and stroke Consider starting ezetimibe 10mg  daily if next lipid panel is greater than 100 Patient self care activities - Over the next 90 days, patient will: Maintain cholesterol medication regimen.  Diabetes Lab Results  Component Value Date/Time   HGBA1C 7.0 (H) 06/19/2020 08:45 AM   HGBA1C 6.7 (H) 11/04/2019 09:27 AM  Pharmacist Clinical Goal(s): Over the next 90 days, patient will work with PharmD and providers to maintain A1c goal <7% Current regimen:  Metformin 500mg  - 1 tablet each morning and 2 tablets in evening with food per medication list but only taking 1 tablet with evening meal currently Farxiga 10mg  - take 0.5 tablet = 5mg  daily  Interventions: Discussed importance of diet and exercise Need to update metformin script with next refill Patient self care activities - Over the next 90 days, patient will: Maintain A1c less than 7% Continue to limit intake of high sugar and carbohydrate foods Exercise regularly with goal of at least 150 minutes of physical activity per week.   Low Testosterone:  Pharmacist Clinical Goal(s): Over the next 90 days, patient will work  with PharmD and providers to maintain testosterone level in therapeutic range and decrease fatigue and other symptoms associated with low testosterone Current regimen:  Testosterone injection - inject 0.75 mL into muscle every 14 days. Interventions: Discussed cost of testosterone therapy - injection is still least expensive compare to patches.  Patient self care activities - Over the next 90 days, patient will:    Continue current therapy for low testosterone  Medication management Pharmacist Clinical Goal(s): Over the next 90 days, patient will work with PharmD and providers to maintain optimal medication adherence Current pharmacy: [facility] Inter[facility] Comprehensive medication review performed. Continue current medication management strategy Patient self care activities - Over the next 90 days, patient will: Focus on medication adherence by filling and taking medications appropriately  Take medications as prescribed Report any questions or concerns to PharmD and/or provider(s)  Please see past updates related to this goal by clicking on the "Past Updates" button in the selected goal            Patient verbalizes understanding of instructions provided today and agrees to view in [place].   Telephone follow up appointment with care management team member scheduled for: 6 months

## 2021-02-27 NOTE — Chronic Care Management (AMB) (Signed)
Chronic Care Management Pharmacy Note  02/27/2021 Name:  Randy Orr MRN:  [id number] DOB:  1951-02-06  Subjective: Randy Orr is an 70 y.o. year old male who is a primary patient of Copland, Gay Filler, MD.  The CCM team was consulted for assistance with disease management and care coordination needs.    Engaged with patient by telephone for follow up visit in response to provider referral for pharmacy case management and/or care coordination services.   Consent to Services:  The patient was given information about Chronic Care Management services, agreed to services, and gave verbal consent prior to initiation of services.  Please see initial visit note for detailed documentation.   Patient Care Team: Copland, Gay Filler, MD as PCP - General (Family Medicine) Cherre Robins, PharmD (Pharmacist)  Recent office visits: Not in last 6 months  Recent consult visits: 11/13/2020 - [facility] visits: None in previous 6 months  Objective:  Lab Results  Component Value Date   CREATININE 1.16 06/19/2020   CREATININE 1.10 07/01/2019   CREATININE 1.21 03/26/2018    Lab Results  Component Value Date   HGBA1C 7.0 (H) 06/19/2020   Last diabetic Eye exam:  Lab Results  Component Value Date/Time   HMDIABEYEEXA No Retinopathy 10/30/2016 04:34 PM    Last diabetic Foot exam: No results found for: HMDIABFOOTEX      Component Value Date/Time   CHOL 187 06/19/2020 0845   TRIG 123.0 06/19/2020 0845   HDL 45.20 06/19/2020 0845   CHOLHDL 4 06/19/2020 0845   VLDL 24.6 06/19/2020 0845   LDLCALC 117 (H) 06/19/2020 0845   LDLDIRECT 146.0 07/01/2019 0904    Hepatic Function Latest Ref Rng & Units 06/19/2020 07/01/2019 03/26/2018  Total Protein 6.0 - 8.3 g/dL 6.3 6.5 6.8  Albumin 3.5 - 5.2 g/dL 4.3 4.3 4.4  AST 0 - 37 U/L '21 22 25  ' ALT 0 - 53 U/L 31 41 44  Alk Phosphatase 39 - 117 U/L 50 50 47  Total Bilirubin 0.2 - 1.2 mg/dL 0.6 0.7 0.6    No results found for:  TSH, FREET4  CBC Latest Ref Rng & Units 06/19/2020 07/01/2019 10/08/2018  WBC 4.0 - 10.5 K/uL 5.2 6.4 6.0  Hemoglobin 13.0 - 17.0 g/dL 15.0 16.1 16.0  Hematocrit 39.0 - 52.0 % 44.9 45.8 45.4  Platelets 150.0 - 400.0 K/uL 230.0 224.0 232.0    No results found for: VD25OH  Clinical ASCVD: No  The 10-year ASCVD risk score Mikey Bussing DC Jr., et al., 2013) is: 22.4%   Values used to calculate the score:     Age: 54 years     Sex: Male     Is Non-Hispanic African American: No     Diabetic: Yes     Tobacco smoker: No     Systolic Blood Pressure: 619 mmHg     Is BP treated: No     HDL Cholesterol: 45.2 mg/dL     Total Cholesterol: 187 mg/dL      Social History   Tobacco Use  Smoking Status Former  Smokeless Tobacco Never  Tobacco Comments   smoked 30 years ago.   BP Readings from Last 3 Encounters:  06/19/20 114/62  11/04/19 124/81  08/20/19 125/63   Pulse Readings from Last 3 Encounters:  06/19/20 70  11/04/19 90  08/20/19 65   Wt Readings from Last 3 Encounters:  06/19/20 216 lb (98 kg)  11/04/19 217 lb (98.4 kg)  08/20/19 225 lb (102.1 kg)  Assessment: Review of patient past medical history, allergies, medications, health status, including review of consultants reports, laboratory and other test data, was performed as part of comprehensive evaluation and provision of chronic care management services.   SDOH:  (Social Determinants of Health) assessments and interventions performed:  SDOH Interventions    Flowsheet Row Most Recent Value  SDOH Interventions   Financial Strain Interventions Intervention Not Indicated  Physical Activity Interventions Intervention Not Indicated       CCM Care Plan  Allergies  Allergen Reactions   Statins Other (See Comments)    We have tried several, cannot tolerate  - myalgias    Medications Reviewed Today     Reviewed by Cherre Robins, PharmD (Pharmacist) on 02/27/21 at 1520  Med List Status: <None>   Medication Order  Taking? Sig Documenting Provider Last Dose Status Informant  Coenzyme Q10 (CO Q 10 PO) [id number] Yes Take 1 tablet by mouth daily. [provider] Taking Active   cyclobenzaprine (FLEXERIL) 10 MG tablet [id number] Yes Take 1 tablet (10 mg total) by mouth daily as needed for muscle spasms. Copland, Gay Filler, MD Taking Active   dapagliflozin propanediol (FARXIGA) 10 MG TABS tablet [id number] Yes Take 1 tablet (10 mg total) by mouth daily before breakfast. Start with 5 mg an increase to 10 as directed  Patient taking differently: Take 5 mg by mouth daily before breakfast. Start with 5 mg an increase to 10 as directed   Copland, Gay Filler, MD Taking Active   l-methylfolate-B6-B12 (METANX) 3-35-2 MG TABS [id number] Yes Take 1 tablet by mouth daily. Dohmeier, Asencion Partridge, MD Taking Active            Med Note Quinn Axe Nov 01, 2019 11:53 AM) Emilio Math  metFORMIN (GLUCOPHAGE) 500 MG tablet [id number] Yes TAKE 1 TABLET BY MOUTH EVERY MORNING AND TAKE 2 TABLETS EVERY EVENING  Patient taking differently: Take 500 mg by mouth every evening.   Copland, Gay Filler, MD Taking Active   Multiple Vitamin (MULTIVITAMIN) tablet [id number] Yes Take 1 tablet by mouth daily. [provider] Taking Active            Med Note Quinn Axe Nov 01, 2019 11:55 AM) Centrum Silver  NEEDLE, DISP, 78 G (B-D HYPODERMIC NEEDLE 23GX1") 23G X 1" [place] [id number] Yes 1 each by Does not apply route once a week. Copland, Gay Filler, MD Taking Active   Plant Sterol Stanol-Pantethine (CHOLESTOFF COMPLETE PO) [id number] Yes Take 1 capsule by mouth daily. [provider] Taking Active   testosterone cypionate (DEPOTESTOSTERONE CYPIONATE) 200 MG/ML injection [id number] Yes INJECT 0.75ML INTRAMUSCULARLY EVERY 14 DAYS Copland, Gay Filler, MD Taking Active             Patient Active Problem List   Diagnosis Date Noted   Statin myopathy 09/27/2020   Left knee pain 06/06/2016   Peripheral sensory neuropathy due  to type 2 diabetes mellitus ([place]) 05/17/2014   Snoring 05/17/2014   Obesity 05/17/2014   Diabetes mellitus ([place]) 09/23/2011   Hypogonadism male 09/23/2011   Hyperlipidemia 09/23/2011    Immunization History  Administered Date(s) Administered   Influenza, High Dose Seasonal PF 06/06/2018, 04/27/2019   Influenza,inj,Quad PF,6+ Mos 05/16/2014   Influenza-Unspecified 05/09/2020   PFIZER Comirnaty(Gray Top)Covid-19 Tri-Sucrose Vaccine 11/13/2020   PFIZER(Purple Top)SARS-COV-2 Vaccination 09/03/2019, 09/24/2019, 05/09/2020   PPD Test 09/23/2011   Pneumococcal Conjugate-13 10/08/2018   Pneumococcal Polysaccharide-23 10/20/2013, 06/19/2020   Tdap 10/20/2013   Zoster Recombinat (Shingrix) 06/05/2020  Conditions to be addressed/monitored: HLD, DMII, and low testosterone  Care Plan : General Pharmacy (Adult)  Updates made by Cherre Robins, PHARMD since 02/27/2021 12:00 AM     Problem: low testosterone, HDL; type 2 DM   Priority: Medium     Long-Range Goal: Manage type 2 DM and other chornic conditions   This Visit's Progress: On track  Priority: Medium  Note:   Current Barriers:  Unable to achieve control of hyperlipidemia   Pharmacist Clinical Goal(s):  Over the next 180 days, patient will achieve control of hyperlipidemia as evidenced by LDL <100  through collaboration with PharmD and provider.   Interventions: 1:1 collaboration with Copland, Gay Filler, MD regarding development and update of comprehensive plan of care as evidenced by provider attestation and co-signature Inter-disciplinary care team collaboration (see longitudinal plan of care) Comprehensive medication review performed; medication list updated in electronic medical record  Hyperlipidemia Lab Results  Component Value Date/Time   LDLCALC 117 (H) 06/19/2020 08:45 AM   LDLDIRECT 146.0 07/01/2019 09:04 AM  Improving but not at goal:  LDL goal < 100 Statin intolerant - has tried several statins in past and all  cause myalgias.  Current regimen:  Cholestoff - 916m of sterols Coenzyme Q10 daily Fish oil 12045m2AM, 1PM  Interventions: Discussed LDL goals and importance of reducing risk of heart attack and stroke Consider starting ezetimibe 1080maily if next lipid panel is greater than 100 Updated med list to include Chloestoff Continue current cholesterol medication regimen. Continue to exercise regularly with goal of at least 150 minutes of physical activity per week.   Diabetes Lab Results  Component Value Date/Time   HGBA1C 7.0 (H) 06/19/2020 08:45 AM   HGBA1C 6.7 (H) 11/04/2019 09:27 AM  Controlled; A1c goal <7% Current regimen:  Metformin 500m34m1 tablet each morning and 2 tablets in evening with food per medication list but only taking 1 tablet with evening meal currently Farxiga 10mg6make 0.5 tablet = 5mg d68my  Dose of metformin was lowered when Farxig[place] Patient is following low CHO diet most of the time though he does reports a few months back he and his wife were baking and eating a lot of lemon pound cakes but he has starting limiting this more.  Lost down to his goal weight of 210lbs in January of 2022 but has increased to 215 lbs. Has restarted intermittent fasting diet.  Interventions: Discussed importance of diet and exercise Need to update metformin script with next refill Continue to limit intake of high sugar foods (like cake) and high carbohydrate foods Exercise regularly with goal of at least 150 minutes of physical activity per week.   Low Testosterone:  Controlled; goal: maintain testosterone level in therapeutic range and decrease fatigue and other symptoms associated with low testosterone Current regimen:  Testosterone injection - inject 0.75 mL into muscle every 14 days. Interventions: Discussed cost of testosterone therapy - injection is still least expensive compare to patches. Cost of testosterone injection is $109 with [employer] but pt [facility] Cost is acceptable to patient.   Continue current therapy for low testosterone  Medication management Goal: maintain optimal medication adherence [facility] Interventions Comprehensive medication review performed. Continue current medication management strategy   Patient Goals/Self-Care Activities Over the next 180 days, patient will:  take medications as prescribed, check glucose daily , document, and provide at future appointments, and target a minimum of 150 minutes of moderate  intensity exercise weekly  Follow Up Plan: Telephone follow up appointment with care management team member scheduled for:  6 months appt with pharmD ; pharmacy team CMA will check home BG in 3 months       Medication Assistance: None required.  Patient affirms current coverage meets needs.  [id number]: [phone]: [phone]  Follow Up:  Patient agrees to Care Plan and Follow-up.  Plan: Telephone follow up appointment with care management team member scheduled for:  6 months with clinical pharamcist  TCherre Robins PharmD Clinical Pharmacist [phone]

## 2021-03-11 NOTE — Patient Instructions (Addendum)
Always a pleasure to see you, I will be in touch with your labs as soon as possible Assuming all is well please see me in about 6 months Please ask your eye doctor to send me a copy of their report

## 2021-03-11 NOTE — Progress Notes (Addendum)
[facility] [address], [place], [place] [address] [phone]  Date:  03/12/2021   Name:  Randy Orr   DOB:  [birth year]   MRN:  [id number]  PCP:  [name], MD    Chief Complaint: Follow-up (Lab work, 6 month follow up)   History of Present Illness:  Randy Orr is a 70 y.o. very pleasant male patient who presents with the following:  Randy Orr is here today for follow-up visit- History of diabetes, peripheral neuropathy, hypogonadism, hyperlipidemia He is unable to tolerate statins Last seen by myself in November He had COVID in June Most recent labs in November-T levels within range at that time A1c was 7%  He is also being followed by our clinical pharmacist, Tammy.  Most recent evaluation last month  We decreased his dose of metformin and added Farxiga at his last visit due to diarrhea-can check A1c today  Farxiga  '5mg'$  Metformin 500 mg once daily Testosterone 150 mg every 14 days His last shot was 10 days ago He might like to go back to 200 mg every 2 weeks if we can as he tends to feel a little tired around day 10-12 of his 2 week cycle We also discussed potentially changing his routine to a shot every 10 days  Eye exam- this is coming up in September  Second dose of shingles- this was done in January of this year   Wt Readings from Last 3 Encounters:  03/12/21 222 lb (100.7 kg)  06/19/20 216 lb (98 kg)  11/04/19 217 lb (98.4 kg)   He notes that his weight has gone up a bit  He is not practicing intermittent fasting quite as much as he was- he was down to 210 for a while in February of this year   He denies any chest pain or unusual shortness of breath.  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 which he always enjoys  Lab Results  Component Value Date   HGBA1C 7.0 (H) 06/19/2020    Patient  Active Problem List   Diagnosis Date Noted   Statin myopathy 09/27/2020   Left knee pain 06/06/2016   Peripheral sensory neuropathy due to type 2 diabetes mellitus ([place]) 05/17/2014   Snoring 05/17/2014   Obesity 05/17/2014   Diabetes mellitus ([place]) 09/23/2011   Hypogonadism male 09/23/2011   Hyperlipidemia 09/23/2011    Past Medical History:  Diagnosis Date   Diabetes mellitus without complication ([place])    DM type 2   GERD (gastroesophageal reflux disease)    Hyperlipidemia    Low testosterone    Obesity 05/17/2014   Peripheral sensory neuropathy due to type 2 diabetes mellitus ([place]) 05/17/2014   Snoring 05/17/2014    Past Surgical History:  Procedure Laterality Date   COLONOSCOPY  07/18/2014    Social History   Tobacco Use   Smoking status: Former   Smokeless tobacco: Never   Tobacco comments:    smoked 30 years ago.  Substance Use Topics   Alcohol use: Yes    Alcohol/week: 5.0 standard drinks    Types: 5 Glasses of wine per week   Drug use: No    Family History  Problem Relation Age of Onset   Diabetes Brother    Heart attack Father    Colon cancer Neg Hx    Esophageal  cancer Neg Hx    Rectal cancer Neg Hx    Stomach cancer Neg Hx     Allergies  Allergen Reactions   Statins Other (See Comments)    We have tried several, cannot tolerate  - myalgias    Medication list has been reviewed and updated.  Current Outpatient Medications on File Prior to Visit  Medication Sig Dispense Refill   Coenzyme Q10 (CO Q 10 PO) Take 1 tablet by mouth daily.     cyclobenzaprine (FLEXERIL) 10 MG tablet Take 1 tablet (10 mg total) by mouth daily as needed for muscle spasms. 30 tablet 0   dapagliflozin propanediol (FARXIGA) 10 MG TABS tablet Take 1 tablet (10 mg total) by mouth daily before breakfast. Start with 5 mg an increase to 10 as directed (Patient taking differently: Take 5 mg by mouth daily before breakfast. Start with 5 mg an increase to 10 as directed) 30 tablet 6    l-methylfolate-B6-B12 (METANX) 3-35-2 MG TABS Take 1 tablet by mouth daily. 60 tablet 1   metFORMIN (GLUCOPHAGE) 500 MG tablet TAKE 1 TABLET BY MOUTH EVERY MORNING AND TAKE 2 TABLETS EVERY EVENING (Patient taking differently: Take 500 mg by mouth every evening.) 270 tablet 1   Multiple Vitamin (MULTIVITAMIN) tablet Take 1 tablet by mouth daily.     NEEDLE, DISP, 23 G (B-D HYPODERMIC NEEDLE 23GX1") 23G X 1" MISC 1 each by Does not apply route once a week. 50 each 3   Plant Sterol Stanol-Pantethine (CHOLESTOFF COMPLETE PO) Take 1 capsule by mouth daily.     testosterone cypionate (DEPOTESTOSTERONE CYPIONATE) 200 MG/ML injection INJECT 0.75ML INTRAMUSCULARLY EVERY 14 DAYS 6 mL 0   No current facility-administered medications on file prior to visit.    Review of Systems:  As per HPI- otherwise negative.   Physical Examination: Vitals:   03/12/21 0934  BP: 120/88  Pulse: 74  Resp: 16  Temp: 97.8 F (36.6 C)  SpO2: 97%   Vitals:   03/12/21 0934  Weight: 222 lb (100.7 kg)  Height: '6\' 3"'$  (1.905 m)   Body mass index is 27.75 kg/m. Ideal Body Weight: Weight in (lb) to have BMI = 25: 199.6  GEN: no acute distress.  Looks well, overweight HEENT: Atraumatic, Normocephalic.  Bilateral TM wnl, oropharynx normal.  PEERL,EOMI.   Ears and Nose: No external deformity. CV: RRR, No M/G/R. No JVD. No thrill. No extra heart sounds. PULM: CTA B, no wheezes, crackles, rhonchi. No retractions. No resp. distress. No accessory muscle use. ABD: S, NT, ND, +BS. No rebound. No HSM. EXTR: No c/c/e PSYCH: Normally interactive. Conversant.    Assessment and Plan: Hypogonadism male - Plan: CBC, Comprehensive metabolic panel, Testosterone Total,Free,Bio, Males-([facility])  Type 2 diabetes mellitus without complication, without long-term current use of insulin (HCC) - Plan: Comprehensive metabolic panel, Hemoglobin A1c  Mixed hyperlipidemia - Plan: Lipid panel  Screening for prostate cancer - Plan:  PSA  Follow-up visit today.  We discussed diabetes control, A1c is pending.  Overall he is satisfied with his current regimen, we can increase [place] if needed.  We also discussed his testosterone therapy.  Tends to feel tired and somewhat weak around the end of his 2-week injection cycle.  We might want to make an alteration, await labs today  This visit occurred during the SARS-CoV-2 public health emergency.  Safety protocols were in place, including screening questions prior to the visit, additional usage of staff PPE, and extensive cleaning of exam room while observing appropriate contact  time as indicated for disinfecting solutions.   Signed Lamar Blinks, MD  Received labs as below, message to patient  Results for orders placed or performed in visit on 03/12/21  CBC  Result Value Ref Range   WBC 5.6 4.0 - 10.5 K/uL   RBC 4.88 4.22 - 5.81 Mil/uL   Platelets 194.0 150.0 - 400.0 K/uL   Hemoglobin 15.7 13.0 - 17.0 g/dL   HCT 45.2 39.0 - 52.0 %   MCV 92.7 78.0 - 100.0 fl   MCHC 34.7 30.0 - 36.0 g/dL   RDW 13.2 11.5 - 15.5 %  Comprehensive metabolic panel  Result Value Ref Range   Sodium 136 135 - 145 mEq/L   Potassium 4.4 3.5 - 5.1 mEq/L   Chloride 100 96 - 112 mEq/L   CO2 27 19 - 32 mEq/L   Glucose, Bld 164 (H) 70 - 99 mg/dL   BUN 18 6 - 23 mg/dL   Creatinine, Ser 1.14 0.40 - 1.50 mg/dL   Total Bilirubin 0.8 0.2 - 1.2 mg/dL   Alkaline Phosphatase 50 39 - 117 U/L   AST 22 0 - 37 U/L   ALT 35 0 - 53 U/L   Total Protein 7.0 6.0 - 8.3 g/dL   Albumin 4.5 3.5 - 5.2 g/dL   GFR 66.34 >60.00 mL/min   Calcium 9.5 8.4 - 10.5 mg/dL  Hemoglobin A1c  Result Value Ref Range   Hgb A1c MFr Bld 7.8 (H) 4.6 - 6.5 %  Lipid panel  Result Value Ref Range   Cholesterol 236 (H) 0 - 200 mg/dL   Triglycerides 176.0 (H) 0.0 - 149.0 mg/dL   HDL 53.30 >39.00 mg/dL   VLDL 35.2 0.0 - 40.0 mg/dL   LDL Cholesterol 147 (H) 0 - 99 mg/dL   Total CHOL/HDL Ratio 4    NonHDL 182.38   PSA  Result  Value Ref Range   PSA 2.61 0.10 - 4.00 ng/mL

## 2021-03-12 ENCOUNTER — Encounter: Payer: Self-pay | Admitting: Family Medicine

## 2021-03-12 ENCOUNTER — Ambulatory Visit (INDEPENDENT_AMBULATORY_CARE_PROVIDER_SITE_OTHER): Payer: Medicare HMO | Admitting: Family Medicine

## 2021-03-12 ENCOUNTER — Other Ambulatory Visit: Payer: Self-pay

## 2021-03-12 VITALS — BP 120/88 | HR 74 | Temp 97.8°F | Resp 16 | Ht 75.0 in | Wt 222.0 lb

## 2021-03-12 DIAGNOSIS — E291 Testicular hypofunction: Secondary | ICD-10-CM

## 2021-03-12 DIAGNOSIS — Z125 Encounter for screening for malignant neoplasm of prostate: Secondary | ICD-10-CM

## 2021-03-12 DIAGNOSIS — E782 Mixed hyperlipidemia: Secondary | ICD-10-CM | POA: Diagnosis not present

## 2021-03-12 DIAGNOSIS — E119 Type 2 diabetes mellitus without complications: Secondary | ICD-10-CM

## 2021-03-12 LAB — CBC
HCT: 45.2 % (ref 39.0–52.0)
Hemoglobin: 15.7 g/dL (ref 13.0–17.0)
MCHC: 34.7 g/dL (ref 30.0–36.0)
MCV: 92.7 fl (ref 78.0–100.0)
Platelets: 194 10*3/uL (ref 150.0–400.0)
RBC: 4.88 Mil/uL (ref 4.22–5.81)
RDW: 13.2 % (ref 11.5–15.5)
WBC: 5.6 10*3/uL (ref 4.0–10.5)

## 2021-03-12 LAB — LIPID PANEL
Cholesterol: 236 mg/dL — ABNORMAL HIGH (ref 0–200)
HDL: 53.3 mg/dL (ref >39.00)
LDL Cholesterol: 147 mg/dL — ABNORMAL HIGH (ref 0–99)
NonHDL: 182.38
Total CHOL/HDL Ratio: 4
Triglycerides: 176 mg/dL — ABNORMAL HIGH (ref 0.0–149.0)
VLDL: 35.2 mg/dL (ref 0.0–40.0)

## 2021-03-12 LAB — COMPREHENSIVE METABOLIC PANEL
ALT: 35 U/L (ref 0–53)
AST: 22 U/L (ref 0–37)
Albumin: 4.5 g/dL (ref 3.5–5.2)
Alkaline Phosphatase: 50 U/L (ref 39–117)
BUN: 18 mg/dL (ref 6–23)
CO2: 27 mEq/L (ref 19–32)
Calcium: 9.5 mg/dL (ref 8.4–10.5)
Chloride: 100 mEq/L (ref 96–112)
Creatinine, Ser: 1.14 mg/dL (ref 0.40–1.50)
GFR: 66.34 mL/min (ref >60.00)
Glucose, Bld: 164 mg/dL — ABNORMAL HIGH (ref 70–99)
Potassium: 4.4 mEq/L (ref 3.5–5.1)
Sodium: 136 mEq/L (ref 135–145)
Total Bilirubin: 0.8 mg/dL (ref 0.2–1.2)
Total Protein: 7 g/dL (ref 6.0–8.3)

## 2021-03-12 LAB — PSA: PSA: 2.61 ng/mL (ref 0.10–4.00)

## 2021-03-12 LAB — HEMOGLOBIN A1C: Hgb A1c MFr Bld: 7.8 % — ABNORMAL HIGH (ref 4.6–6.5)

## 2021-03-12 LAB — TESTOSTERONE TOTAL,FREE,BIO, MALES
Albumin: 4.6 g/dL (ref 3.6–5.1)
Sex Hormone Binding: 25 nmol/L (ref 22–77)
Testosterone, Bioavailable: 129.3 ng/dL (ref 110.0–575.0)
Testosterone, Free: 61.6 pg/mL (ref 46.0–224.0)
Testosterone: 383 ng/dL (ref 250–827)

## 2021-03-13 ENCOUNTER — Other Ambulatory Visit: Payer: Self-pay | Admitting: Family Medicine

## 2021-03-13 ENCOUNTER — Encounter: Payer: Self-pay | Admitting: Family Medicine

## 2021-03-13 DIAGNOSIS — R300 Dysuria: Secondary | ICD-10-CM

## 2021-04-17 ENCOUNTER — Encounter: Payer: Self-pay | Admitting: Family Medicine

## 2021-04-18 ENCOUNTER — Other Ambulatory Visit: Payer: Self-pay | Admitting: Family Medicine

## 2021-04-18 DIAGNOSIS — E291 Testicular hypofunction: Secondary | ICD-10-CM

## 2021-04-20 NOTE — Telephone Encounter (Signed)
[facility] - they had to do an override due to frequency change but ok to fill

## 2021-05-17 DIAGNOSIS — E119 Type 2 diabetes mellitus without complications: Secondary | ICD-10-CM | POA: Diagnosis not present

## 2021-05-17 DIAGNOSIS — H524 Presbyopia: Secondary | ICD-10-CM | POA: Diagnosis not present

## 2021-05-17 LAB — HM DIABETES EYE EXAM

## 2021-05-23 ENCOUNTER — Telehealth: Payer: Self-pay | Admitting: Pharmacist

## 2021-05-23 NOTE — Chronic Care Management (AMB) (Signed)
    Chronic Care Management Pharmacy Assistant   Name: Randy Orr  MRN: [id number] DOB: [birth year]  Reason for Encounter: Disease State Diabetes Mellitus  Recent office visits:  03/12/21-Jessica C. Copland, MD (PCP) General follow up visit. Labs ordered. Follow up in 6 months.  Recent consult visits:  None noted  Hospital visits:  None in previous 6 months  Medications: Outpatient Encounter Medications as of 05/23/2021  Medication Sig Note   Coenzyme Q10 (CO Q 10 PO) Take 1 tablet by mouth daily.    cyclobenzaprine (FLEXERIL) 10 MG tablet Take 1 tablet (10 mg total) by mouth daily as needed for muscle spasms.    dapagliflozin propanediol (FARXIGA) 10 MG TABS tablet Take 1 tablet (10 mg total) by mouth daily before breakfast. Start with 5 mg an increase to 10 as directed (Patient taking differently: Take 5 mg by mouth daily before breakfast. Start with 5 mg an increase to 10 as directed)    l-methylfolate-B6-B12 (METANX) 3-35-2 MG TABS Take 1 tablet by mouth daily. 11/01/2019: [facility]   metFORMIN (GLUCOPHAGE) 500 MG tablet TAKE 1 TABLET BY MOUTH EVERY MORNING AND TAKE 2 TABLETS EVERY EVENING (Patient taking differently: Take 500 mg by mouth every evening.)    Multiple Vitamin (MULTIVITAMIN) tablet Take 1 tablet by mouth daily. 11/01/2019: Centrum Silver   NEEDLE, DISP, 23 G (B-D HYPODERMIC NEEDLE 23GX1") 23G X 1" MISC 1 each by Does not apply route once a week.    Plant Sterol Stanol-Pantethine (CHOLESTOFF COMPLETE PO) Take 1 capsule by mouth daily.    testosterone cypionate (DEPOTESTOSTERONE CYPIONATE) 200 MG/ML injection INJECT 0.75ML INTRAMUSCULARLY EVERY 10 DAYS    No facility-administered encounter medications on file as of 05/23/2021.   Current antihyperglycemic regimen:  Metformin 500mg  - 1 tablet each morning and 2 tablets in evening with food per medication list but only taking 1 tablet with evening meal currently Farxiga 10mg  - take 0.5 tablet = 5mg  daily  Unsuccessful  attempt to complete assessment call. I have left attempted to call three times and left three voicemail's to return phone call.    Adherence Review: Is the patient currently on a STATIN medication? No Is the patient currently on ACE/ARB medication? No Does the patient have >5 day gap between last estimated fill dates? No   Star Rating Drugs: Farxiga 10 mg Last filled:04/24/21 30 DS  Corrie Mckusick, [place]

## 2021-06-12 ENCOUNTER — Telehealth: Payer: Self-pay | Admitting: Family Medicine

## 2021-06-12 NOTE — Telephone Encounter (Signed)
Left message for patient to call back and schedule Medicare Annual Wellness Visit (AWV) in office.  ° °If not able to come in office, please offer to do virtually or by telephone.  Left office number and my jabber #[phone]. ° °Due for AWVI ° °Please schedule at anytime with Nurse Health Advisor. °  °

## 2021-06-20 ENCOUNTER — Ambulatory Visit (INDEPENDENT_AMBULATORY_CARE_PROVIDER_SITE_OTHER): Payer: Medicare HMO

## 2021-06-20 VITALS — Ht 75.0 in | Wt 215.0 lb

## 2021-06-20 DIAGNOSIS — Z0000 Encounter for general adult medical examination without abnormal findings: Secondary | ICD-10-CM | POA: Diagnosis not present

## 2021-06-20 NOTE — Patient Instructions (Signed)
Randy Orr , Thank you for taking time to complete your Medicare Wellness Visit. I appreciate your ongoing commitment to your health goals. Please review the following plan we discussed and let me know if I can assist you in the future.   Screening recommendations/referrals: Colonoscopy: Completed 08/20/2019-Due 08/19/2024 Recommended yearly ophthalmology/optometry visit for glaucoma screening and checkup Recommended yearly dental visit for hygiene and checkup  Vaccinations: Influenza vaccine: Up to date Pneumococcal vaccine: Up to date Tdap vaccine: Up to date-Due-10/21/2023 Shingles vaccine: Completed vaccines   Covid-19: Up to date  Advanced directives:   Conditions/risks identified: See problem list  Next appointment: Follow up in one year for your annual wellness visit.   Preventive Care 68 Years and Older, Male Preventive care refers to lifestyle choices and visits with your health care provider that can promote health and wellness. What does preventive care include? A yearly physical exam. This is also called an annual well check. Dental exams once or twice a year. Routine eye exams. Ask your health care provider how often you should have your eyes checked. Personal lifestyle choices, including: Daily care of your teeth and gums. Regular physical activity. Eating a healthy diet. Avoiding tobacco and drug use. Limiting alcohol use. Practicing safe sex. Taking low doses of aspirin every day. Taking vitamin and mineral supplements as recommended by your health care provider. What happens during an annual well check? The services and screenings done by your health care provider during your annual well check will depend on your age, overall health, lifestyle risk factors, and family history of disease. Counseling  Your health care provider may ask you questions about your: Alcohol use. Tobacco use. Drug use. Emotional well-being. Home and relationship well-being. Sexual  activity. Eating habits. History of falls. Memory and ability to understand (cognition). Work and work Statistician. Screening  You may have the following tests or measurements: Height, weight, and BMI. Blood pressure. Lipid and cholesterol levels. These may be checked every 5 years, or more frequently if you are over 68 years old. Skin check. Lung cancer screening. You may have this screening every year starting at age 68 if you have a 30-pack-year history of smoking and currently smoke or have quit within the past 15 years. Fecal occult blood test (FOBT) of the stool. You may have this test every year starting at age 68. Flexible sigmoidoscopy or colonoscopy. You may have a sigmoidoscopy every 5 years or a colonoscopy every 10 years starting at age 68. Prostate cancer screening. Recommendations will vary depending on your family history and other risks. Hepatitis C blood test. Hepatitis B blood test. Sexually transmitted disease (STD) testing. Diabetes screening. This is done by checking your blood sugar (glucose) after you have not eaten for a while (fasting). You may have this done every 1-3 years. Abdominal aortic aneurysm (AAA) screening. You may need this if you are a current or former smoker. Osteoporosis. You may be screened starting at age 68 if you are at high risk. Talk with your health care provider about your test results, treatment options, and if necessary, the need for more tests. Vaccines  Your health care provider may recommend certain vaccines, such as: Influenza vaccine. This is recommended every year. Tetanus, diphtheria, and acellular pertussis (Tdap, Td) vaccine. You may need a Td booster every 10 years. Zoster vaccine. You may need this after age 68. Pneumococcal 13-valent conjugate (PCV13) vaccine. One dose is recommended after age 68. Pneumococcal polysaccharide (PPSV23) vaccine. One dose is recommended after age 68.  Talk to your health care provider about which  screenings and vaccines you need and how often you need them. This information is not intended to replace advice given to you by your health care provider. Make sure you discuss any questions you have with your health care provider. Document Released: 08/25/2015 Document Revised: 04/17/2016 Document Reviewed: 05/30/2015 Elsevier Interactive Patient Education  2017 [place] Prevention in the Home Falls can cause injuries. They can happen to people of all ages. There are many things you can do to make your home safe and to help prevent falls. What can I do on the outside of my home? Regularly fix the edges of walkways and driveways and fix any cracks. Remove anything that might make you trip as you walk through a door, such as a raised step or threshold. Trim any bushes or trees on the path to your home. Use bright outdoor lighting. Clear any walking paths of anything that might make someone trip, such as rocks or tools. Regularly check to see if handrails are loose or broken. Make sure that both sides of any steps have handrails. Any raised decks and porches should have guardrails on the edges. Have any leaves, snow, or ice cleared regularly. Use sand or salt on walking paths during winter. Clean up any spills in your garage right away. This includes oil or grease spills. What can I do in the bathroom? Use night lights. Install grab bars by the toilet and in the tub and shower. Do not use towel bars as grab bars. Use non-skid mats or decals in the tub or shower. If you need to sit down in the shower, use a plastic, non-slip stool. Keep the floor dry. Clean up any water that spills on the floor as soon as it happens. Remove soap buildup in the tub or shower regularly. Attach bath mats securely with double-sided non-slip rug tape. Do not have throw rugs and other things on the floor that can make you trip. What can I do in the bedroom? Use night lights. Make sure that you have a  light by your bed that is easy to reach. Do not use any sheets or blankets that are too big for your bed. They should not hang down onto the floor. Have a firm chair that has side arms. You can use this for support while you get dressed. Do not have throw rugs and other things on the floor that can make you trip. What can I do in the kitchen? Clean up any spills right away. Avoid walking on wet floors. Keep items that you use a lot in easy-to-reach places. If you need to reach something above you, use a strong step stool that has a grab bar. Keep electrical cords out of the way. Do not use floor polish or wax that makes floors slippery. If you must use wax, use non-skid floor wax. Do not have throw rugs and other things on the floor that can make you trip. What can I do with my stairs? Do not leave any items on the stairs. Make sure that there are handrails on both sides of the stairs and use them. Fix handrails that are broken or loose. Make sure that handrails are as long as the stairways. Check any carpeting to make sure that it is firmly attached to the stairs. Fix any carpet that is loose or worn. Avoid having throw rugs at the top or bottom of the stairs. If you do have throw  rugs, attach them to the floor with carpet tape. Make sure that you have a light switch at the top of the stairs and the bottom of the stairs. If you do not have them, ask someone to add them for you. What else can I do to help prevent falls? Wear shoes that: Do not have high heels. Have rubber bottoms. Are comfortable and fit you well. Are closed at the toe. Do not wear sandals. If you use a stepladder: Make sure that it is fully opened. Do not climb a closed stepladder. Make sure that both sides of the stepladder are locked into place. Ask someone to hold it for you, if possible. Clearly mark and make sure that you can see: Any grab bars or handrails. First and last steps. Where the edge of each step  is. Use tools that help you move around (mobility aids) if they are needed. These include: Canes. Walkers. Scooters. Crutches. Turn on the lights when you go into a dark area. Replace any light bulbs as soon as they burn out. Set up your furniture so you have a clear path. Avoid moving your furniture around. If any of your floors are uneven, fix them. If there are any pets around you, be aware of where they are. Review your medicines with your doctor. Some medicines can make you feel dizzy. This can increase your chance of falling. Ask your doctor what other things that you can do to help prevent falls. This information is not intended to replace advice given to you by your health care provider. Make sure you discuss any questions you have with your health care provider. Document Released: 05/25/2009 Document Revised: 01/04/2016 Document Reviewed: 09/02/2014 Elsevier Interactive Patient Education  2017 [employer].

## 2021-06-20 NOTE — Progress Notes (Signed)
Subjective:   Randy Orr is a 70 y.o. male who presents for an Initial Medicare Annual Wellness Visit.  I connected with Markeem today by telephone and verified that I am speaking with the correct person using two identifiers. Location patient: home Location provider: work Persons participating in the virtual visit: patient, Marine scientist.    I discussed the limitations, risks, security and privacy concerns of performing an evaluation and management service by telephone and the availability of in person appointments. I also discussed with the patient that there may be a patient responsible charge related to this service. The patient expressed understanding and verbally consented to this telephonic visit.    Interactive audio and video telecommunications were attempted between this provider and patient, however failed, due to patient having technical difficulties OR patient did not have access to video capability.  We continued and completed visit with audio only.  Some vital signs may be absent or patient reported.   Time Spent with patient on telephone encounter: 40 minutes   Review of Systems     Cardiac Risk Factors include: advanced age (>50men, >43 women);male gender;diabetes mellitus;dyslipidemia     Objective:    Today's Vitals   06/20/21 0900 06/20/21 0902  Weight: 215 lb (97.5 kg)   Height: 6\' 3"  (1.905 m)   PainSc:  1    Body mass index is 26.87 kg/m.  Advanced Directives 06/20/2021 07/04/2014  Does Patient Have a Medical Advance Directive? No No  Would patient like information on creating a medical advance directive? No - Patient declined -    Current Medications (verified) Outpatient Encounter Medications as of 06/20/2021  Medication Sig   Coenzyme Q10 (CO Q 10 PO) Take 1 tablet by mouth daily.   cyclobenzaprine (FLEXERIL) 10 MG tablet Take 1 tablet (10 mg total) by mouth daily as needed for muscle spasms.   dapagliflozin propanediol (FARXIGA) 10 MG TABS tablet  Take 1 tablet (10 mg total) by mouth daily before breakfast. Start with 5 mg an increase to 10 as directed (Patient taking differently: Take 5 mg by mouth daily before breakfast. Start with 5 mg an increase to 10 as directed)   l-methylfolate-B6-B12 (METANX) 3-35-2 MG TABS Take 1 tablet by mouth daily.   metFORMIN (GLUCOPHAGE) 500 MG tablet TAKE 1 TABLET BY MOUTH EVERY MORNING AND TAKE 2 TABLETS EVERY EVENING (Patient taking differently: Take 500 mg by mouth every evening.)   Multiple Vitamin (MULTIVITAMIN) tablet Take 1 tablet by mouth daily.   NEEDLE, DISP, 23 G (B-D HYPODERMIC NEEDLE 23GX1") 23G X 1" MISC 1 each by Does not apply route once a week.   Plant Sterol Stanol-Pantethine (CHOLESTOFF COMPLETE PO) Take 1 capsule by mouth daily.   testosterone cypionate (DEPOTESTOSTERONE CYPIONATE) 200 MG/ML injection INJECT 0.75ML INTRAMUSCULARLY EVERY 10 DAYS   No facility-administered encounter medications on file as of 06/20/2021.    Allergies (verified) Statins   History: Past Medical History:  Diagnosis Date   Diabetes mellitus without complication ([place])    DM type 2   GERD (gastroesophageal reflux disease)    Hyperlipidemia    Low testosterone    Obesity 05/17/2014   Peripheral sensory neuropathy due to type 2 diabetes mellitus ([place]) 05/17/2014   Snoring 05/17/2014   Past Surgical History:  Procedure Laterality Date   COLONOSCOPY  07/18/2014   Family History  Problem Relation Age of Onset   Diabetes Brother    Heart attack Father    Colon cancer Neg Hx    Esophageal cancer  Neg Hx    Rectal cancer Neg Hx    Stomach cancer Neg Hx    Social History   Socioeconomic History   Marital status: Married    Spouse name: Not on file   Number of children: 1   Years of education: AAS   Highest education level: Not on file  Occupational History   Occupation: SECURITY OFFICER    Employer: [employer]  Tobacco Use   Smoking status: Former   Smokeless tobacco: Never   Tobacco  comments:    smoked 30 years ago.  Substance and Sexual Activity   Alcohol use: Yes    Alcohol/week: 5.0 standard drinks    Types: 5 Glasses of wine per week   Drug use: No   Sexual activity: Not on file  Other Topics Concern   Not on file  Social History Narrative   Not on file   Social Determinants of Health   Financial Resource Strain: Low Risk    Difficulty of Paying Living Expenses: Not hard at all  Food Insecurity: No Food Insecurity   Worried About Charity fundraiser in the Last Year: Never true   Ran Out of Food in the Last Year: Never true  Transportation Needs: No Transportation Needs   Lack of Transportation (Medical): No   Lack of Transportation (Non-Medical): No  Physical Activity: Inactive   Days of Exercise per Week: 0 days   Minutes of Exercise per Session: 0 min  Stress: Stress Concern Present   Feeling of Stress : To some extent  Social Connections: Moderately Integrated   Frequency of Communication with Friends and Family: More than three times a week   Frequency of Social Gatherings with Friends and Family: Once a week   Attends Religious Services: Never   Marine scientist or Organizations: Yes   Attends Music therapist: More than 4 times per year   Marital Status: Married    Tobacco Counseling Counseling given: Not Answered Tobacco comments: smoked 30 years ago.   Clinical Intake:  Pre-visit preparation completed: Yes  Pain : 0-10 Pain Score: 1  Pain Type: Acute pain Pain Location: Back Pain Onset: 1 to 4 weeks ago Pain Frequency: Intermittent     BMI - recorded: 26.87 Nutritional Status: BMI 25 -29 Overweight Nutritional Risks: None Diabetes: No  How often do you need to have someone help you when you read instructions, pamphlets, or other written materials from your doctor or pharmacy?: 1 - Never  Diabetes:  Is the patient diabetic?  Yes  If diabetic, was a CBG obtained today?  No  Did the patient bring in  their glucometer from home?  No phone visit How often do you monitor your CBG's? Never.   Financial Strains and Diabetes Management:  Are you having any financial strains with the device, your supplies or your medication? No .  Does the patient want to be seen by Chronic Care Management for management of their diabetes?  No  Would the patient like to be referred to a Nutritionist or for Diabetic Management?  No   Diabetic Exams:  Diabetic Eye Exam: Completed 05/17/2021.  Diabetic Foot Exam: Pt has been advised about the importance in completing this exam. To be completed by PCP.    Interpreter Needed?: No  Information entered by :: Caroleen Hamman LPN   Activities of Daily Living In your present state of health, do you have any difficulty performing the following activities: 06/20/2021  Hearing? [name]  Comment hearing aids  Vision? N  Difficulty concentrating or making decisions? N  Walking or climbing stairs? N  Dressing or bathing? N  Doing errands, shopping? N  Preparing Food and eating ? N  Using the Toilet? N  In the past six months, have you accidently leaked urine? N  Do you have problems with loss of bowel control? N  Managing your Medications? N  Managing your Finances? N  Housekeeping or managing your Housekeeping? N  Some recent data might be hidden    Patient Care Team: Copland, Gay Filler, MD as PCP - General (Family Medicine) Cherre Robins, RPH-CPP (Pharmacist)  Indicate any recent Medical Services you may have received from other than [facility] providers in the past year (date may be approximate).     Assessment:   This is a routine wellness examination for Randy Orr.  Hearing/Vision screen Hearing Screening - Comments:: Bilateral hearing aids Vision Screening - Comments:: Last eye exam-[facility]-05/2021  Dietary issues and exercise activities discussed: Current Exercise Habits: The patient does not participate in regular exercise at present, Exercise limited  by: None identified   Goals Addressed             This Visit's Progress    Patient Stated       Increase activity       Depression Screen PHQ 2/9 Scores 06/20/2021 06/01/2020 03/01/2015  PHQ - 2 Score 0 0 0    Fall Risk Fall Risk  06/20/2021  Falls in the past year? 0  Number falls in past yr: 0  Injury with Fall? 0  Follow up Falls prevention discussed    FALL RISK PREVENTION PERTAINING TO THE HOME:  Any stairs in or around the home? Yes  If so, are there any without handrails? No  Home free of loose throw rugs in walkways, pet beds, electrical cords, etc? Yes  Adequate lighting in your home to reduce risk of falls? Yes   ASSISTIVE DEVICES UTILIZED TO PREVENT FALLS:  Life alert? No  Use of a cane, walker or w/c? No  Grab bars in the bathroom? No  Shower chair or bench in shower? No  Elevated toilet seat or a handicapped toilet? No   TIMED UP AND GO:  Was the test performed? No . Phone visit   Cognitive Function:Normal cognitive status assessed bythis Nurse Health Advisor. No abnormalities found.          Immunizations Immunization History  Administered Date(s) Administered   Influenza, High Dose Seasonal PF 06/06/2018, 04/27/2019   Influenza,inj,Quad PF,6+ Mos 05/16/2014   Influenza-Unspecified 05/09/2020, 04/12/2021   PFIZER Comirnaty(Gray Top)Covid-19 Tri-Sucrose Vaccine 11/13/2020   PFIZER(Purple Top)SARS-COV-2 Vaccination 09/03/2019, 09/24/2019, 05/09/2020   PPD Test 09/23/2011   Pfizer Covid-19 Vaccine Bivalent Booster 78yrs & up 05/12/2021   Pneumococcal Conjugate-13 10/08/2018   Pneumococcal Polysaccharide-23 10/20/2013, 06/19/2020   Tdap 10/20/2013   Zoster Recombinat (Shingrix) 06/05/2020, 08/21/2020    TDAP status: Up to date  Flu Vaccine status: Up to date  Pneumococcal vaccine status: Up to date  Covid-19 vaccine status: Completed vaccines  Qualifies for Shingles Vaccine? No   Zostavax completed No   Shingrix Completed?:  Yes  Screening Tests Health Maintenance  Topic Date Due   FOOT EXAM  06/19/2021   URINE MICROALBUMIN  06/19/2021   HEMOGLOBIN A1C  09/12/2021   OPHTHALMOLOGY EXAM  05/17/2022   TETANUS/TDAP  10/21/2023   COLONOSCOPY (Pts 45-26yrs Insurance coverage will need to be confirmed)  08/19/2024   Pneumonia Vaccine 65+  Years old  Completed   INFLUENZA VACCINE  Completed   COVID-19 Vaccine  Completed   Hepatitis C Screening  Completed   Zoster Vaccines- Shingrix  Completed   HPV VACCINES  Aged Out    Health Maintenance  Health Maintenance Due  Topic Date Due   FOOT EXAM  06/19/2021   URINE MICROALBUMIN  06/19/2021    Colorectal cancer screening: Type of screening: Colonoscopy. Completed 08/20/2019. Repeat every 5 years  Lung Cancer Screening: (Low Dose CT Chest recommended if Age 77-80 years, 30 pack-year currently smoking OR have quit w/in 15years.) does not qualify.     Additional Screening:  Hepatitis C Screening: Completed 01/01/2017  Vision Screening: Recommended annual ophthalmology exams for early detection of glaucoma and other disorders of the eye. Is the patient up to date with their annual eye exam?  Yes  Who is the provider or what is the name of the office in which the patient attends annual eye exams? [facility]   Dental Screening: Recommended annual dental exams for proper oral hygiene  Community Resource Referral / Chronic Care Management: CRR required this visit?  No   CCM required this visit?  No      Plan:     I have personally reviewed and noted the following in the patient's chart:   Medical and social history Use of alcohol, tobacco or illicit drugs  Current medications and supplements including opioid prescriptions. Patient is not currently taking opioid prescriptions. Functional ability and status Nutritional status Physical activity Advanced directives List of other physicians Hospitalizations, surgeries, and ER visits in previous 12  months Vitals Screenings to include cognitive, depression, and falls Referrals and appointments  In addition, I have reviewed and discussed with patient certain preventive protocols, quality metrics, and best practice recommendations. A written personalized care plan for preventive services as well as general preventive health recommendations were provided to patient.   Due to this being a telephonic visit, the after visit summary with patients personalized plan was offered to patient via mail or my-chart. Patient would like to access on my-chart.   Marta Antu, LPN   28/[id number]/[id number]  Nurse Health Advisor  Nurse Notes: None

## 2021-06-21 ENCOUNTER — Other Ambulatory Visit: Payer: Self-pay | Admitting: Family Medicine

## 2021-06-22 ENCOUNTER — Other Ambulatory Visit: Payer: Self-pay | Admitting: Family Medicine

## 2021-06-22 DIAGNOSIS — E119 Type 2 diabetes mellitus without complications: Secondary | ICD-10-CM

## 2021-06-24 NOTE — Progress Notes (Signed)
[facility] [address], [place], [place] [address] [phone]  Date:  06/28/2021   Name:  Randy Orr   DOB:  [birth year]   MRN:  [id number]  PCP:  [name], MD    Chief Complaint: 3-4 month follow up (Concerns/ questions: low back issues are some better d/t yesterday's chiropractor visit. /Flu shot: received on 04/12/21/)   History of Present Illness:  Randy Orr is a 70 y.o. very pleasant male patient who presents with the following:  Here today for a follow-up visit Last seen by myself in August - History of diabetes, peripheral neuropathy, hypogonadism, hyperlipidemia He is unable to tolerate statins  Last A1c was a bit elevated- we had him increase farxiga to 10 mg, but then we went back to 5 mg due to SE  Metformin - just taking 500 mg a day; he will get GI side effects with any higher dosage  We could add glipizide if we need to -he is interested in GLP-1's, but most of them are too expensive with his current insurance plan   Lab Results  Component Value Date   HGBA1C 7.8 (H) 03/12/2021   We had him change his T routine to 150 mg every 10 days for more stability  He feels like it is working well- no mood swings, he feels like his dose is good-we will check his level today Today is day 6 since his last shot   He is still working on his weight- he has been under some stress and lost some progress he had made. They had to put their cat down and then his 69 year old mother fell and broke her hip- she got surgery and went to rehab, now she is back home and is doing okay.  However of course this episode was very stressful   Patient Active Problem List   Diagnosis Date Noted   Statin myopathy 09/27/2020   Left knee pain 06/06/2016   Peripheral sensory neuropathy due to type 2 diabetes mellitus ([place]) 05/17/2014   Snoring 05/17/2014   Obesity 05/17/2014   Diabetes mellitus ([place]) 09/23/2011    Hypogonadism male 09/23/2011   Hyperlipidemia 09/23/2011    Past Medical History:  Diagnosis Date   Diabetes mellitus without complication ([place])    DM type 2   GERD (gastroesophageal reflux disease)    Hyperlipidemia    Low testosterone    Obesity 05/17/2014   Peripheral sensory neuropathy due to type 2 diabetes mellitus ([place]) 05/17/2014   Snoring 05/17/2014    Past Surgical History:  Procedure Laterality Date   COLONOSCOPY  07/18/2014    Social History   Tobacco Use   Smoking status: Former   Smokeless tobacco: Never   Tobacco comments:    smoked 30 years ago.  Substance Use Topics   Alcohol use: Yes    Alcohol/week: 5.0 standard drinks    Types: 5 Glasses of wine per week   Drug use: No    Family History  Problem Relation Age of Onset   Diabetes Brother    Heart attack Father    Colon cancer Neg Hx    Esophageal cancer Neg Hx    Rectal cancer Neg Hx    Stomach cancer Neg Hx     Allergies  Allergen Reactions   Statins Other (See Comments)    We have tried several, cannot tolerate  - myalgias    Medication list has been reviewed and  updated.  Current Outpatient Medications on File Prior to Visit  Medication Sig Dispense Refill   Coenzyme Q10 (CO Q 10 PO) Take 1 tablet by mouth daily.     cyclobenzaprine (FLEXERIL) 10 MG tablet Take 1 tablet (10 mg total) by mouth daily as needed for muscle spasms. 30 tablet 0   dapagliflozin propanediol (FARXIGA) 10 MG TABS tablet Take 1 tablet (10 mg total) by mouth daily. 30 tablet 6   l-methylfolate-B6-B12 (METANX) 3-35-2 MG TABS Take 1 tablet by mouth daily. 60 tablet 1   metFORMIN (GLUCOPHAGE) 500 MG tablet TAKE 1 TABLET BY MOUTH EVERY MORNING AND TAKE 2 TABLETS EVERY EVENING 270 tablet 1   Multiple Vitamin (MULTIVITAMIN) tablet Take 1 tablet by mouth daily.     NEEDLE, DISP, 23 G (B-D HYPODERMIC NEEDLE 23GX1") 23G X 1" MISC 1 each by Does not apply route once a week. 50 each 3   Plant Sterol Stanol-Pantethine  (CHOLESTOFF COMPLETE PO) Take 1 capsule by mouth daily.     testosterone cypionate (DEPOTESTOSTERONE CYPIONATE) 200 MG/ML injection INJECT 0.75ML INTRAMUSCULARLY EVERY 10 DAYS 8 mL 1   No current facility-administered medications on file prior to visit.    Review of Systems:  As per HPI- otherwise negative.   Physical Examination: Vitals:   06/28/21 0917  BP: (!) 142/80  Pulse: 76  Resp: 18  Temp: 97.8 F (36.6 C)  SpO2: 99%   Vitals:   06/28/21 0917  Weight: 220 lb (99.8 kg)  Height: 6\' 3"  (1.905 m)   Body mass index is 27.5 kg/m. Ideal Body Weight: Weight in (lb) to have BMI = 25: 199.6  GEN: no acute distress.  Mild overweight, looks well and his normal self HEENT: Atraumatic, Normocephalic.  Ears and Nose: No external deformity. CV: RRR, No M/G/R. No JVD. No thrill. No extra heart sounds. PULM: CTA B, no wheezes, crackles, rhonchi. No retractions. No resp. distress. No accessory muscle use. ABD: S, NT, ND EXTR: No c/c/e PSYCH: Normally interactive. Conversant.    Assessment and Plan: Type 2 diabetes mellitus without complication, without long-term current use of insulin (HCC) - Plan: Hemoglobin A1c, Microalbumin / creatinine urine ratio  Mixed hyperlipidemia  Hypogonadism male - Plan: Testosterone Total,Free,Bio, Males-([facility])  Patient seen today for follow-up of diabetes and hypogonadism.  We will get back in touch with him pending labs If A1c is still high consider adding glipizide  Signed Lamar Blinks, MD

## 2021-06-28 ENCOUNTER — Ambulatory Visit (INDEPENDENT_AMBULATORY_CARE_PROVIDER_SITE_OTHER): Payer: Medicare HMO | Admitting: Family Medicine

## 2021-06-28 ENCOUNTER — Other Ambulatory Visit: Payer: Self-pay

## 2021-06-28 ENCOUNTER — Encounter: Payer: Self-pay | Admitting: Family Medicine

## 2021-06-28 VITALS — BP 142/80 | HR 76 | Temp 97.8°F | Resp 18 | Ht 75.0 in | Wt 220.0 lb

## 2021-06-28 DIAGNOSIS — E119 Type 2 diabetes mellitus without complications: Secondary | ICD-10-CM

## 2021-06-28 DIAGNOSIS — E782 Mixed hyperlipidemia: Secondary | ICD-10-CM | POA: Diagnosis not present

## 2021-06-28 DIAGNOSIS — E291 Testicular hypofunction: Secondary | ICD-10-CM | POA: Diagnosis not present

## 2021-06-28 LAB — MICROALBUMIN / CREATININE URINE RATIO
Creatinine,U: 33.1 mg/dL
Microalb Creat Ratio: 2.1 mg/g (ref 0.0–30.0)
Microalb, Ur: <0.7 mg/dL (ref 0.0–1.9)

## 2021-06-28 LAB — HEMOGLOBIN A1C: Hgb A1c MFr Bld: 7.3 % — ABNORMAL HIGH (ref 4.6–6.5)

## 2021-06-28 NOTE — Patient Instructions (Addendum)
Good to see you again today- I will be in touch with your labs asap  We will make any adjustment to your diabetes medication as needed   Assuming all is well please see me in about 6 months

## 2021-06-29 LAB — TESTOSTERONE TOTAL,FREE,BIO, MALES
Albumin: 4.5 g/dL (ref 3.6–5.1)
Sex Hormone Binding: 34 nmol/L (ref 22–77)
Testosterone, Bioavailable: 368 ng/dL (ref 110.0–575.0)
Testosterone, Free: 179 pg/mL (ref 46.0–224.0)
Testosterone: 1119 ng/dL — ABNORMAL HIGH (ref 250–827)

## 2021-07-16 MED ORDER — TESTOSTERONE CYPIONATE 200 MG/ML IM SOLN: INTRAMUSCULAR | 1 refills | Status: DC

## 2021-07-16 NOTE — Addendum Note (Signed)
Addended by: Lamar Blinks C on: 07/16/2021 12:41 PM   Modules accepted: Orders

## 2021-08-30 ENCOUNTER — Ambulatory Visit (INDEPENDENT_AMBULATORY_CARE_PROVIDER_SITE_OTHER): Payer: Medicare HMO | Admitting: Pharmacist

## 2021-08-30 DIAGNOSIS — E782 Mixed hyperlipidemia: Secondary | ICD-10-CM

## 2021-08-30 DIAGNOSIS — E291 Testicular hypofunction: Secondary | ICD-10-CM

## 2021-08-30 DIAGNOSIS — E119 Type 2 diabetes mellitus without complications: Secondary | ICD-10-CM

## 2021-09-04 NOTE — Chronic Care Management (AMB) (Signed)
Chronic Care Management Pharmacy Note  09/04/2021 Name:  Randy Orr MRN:  [id number] DOB:  [birth year]  Summary:  Patient voiced concern regarding cost of testosterone therapy. For 2022 testosterone therapy was not covered on his Medicare plan so he was using GoodRx card to purchase at $21 per month. Checked 2023 formulary for [facility] and it looks like several testosterone products are now covered. Testosterone Injectable $5 per month - called his pharmacy and they reran Rx from 08/18/2021 and cost was $5! Also discussed with a patient that some topical testosterone products are covered:   Generic Axiron - testosterone 20m/1.5mL solution will be $5 per month (recommended dose 326munder each arm up to 12072motal)  Generic Fortesta and Vogelox - testosterone 69m61m 50mg77m 1.25g of gel would be $47 per month.   Patient would like to stick with injectable for now but might consider generic Axiron in future.  Noted to be intolerant to statins. Patient declined to retry. If next LDL above goal of <100, consider addition of ezetimibe.      Subjective: Randy Mcmanawayn 70 y.71 year old male who is a primary patient of Copland, JessiGay Filler  The CCM team was consulted for assistance with disease management and care coordination needs.    Engaged with patient by telephone for follow up visit in response to provider referral for pharmacy case management and/or care coordination services.   Consent to Services:  The patient was given information about Chronic Care Management services, agreed to services, and gave verbal consent prior to initiation of services.  Please see initial visit note for detailed documentation.   Patient Care Team: Copland, JessiGay Filleras PCP - General (Family Medicine) EckarCherre Robins-CPP (Pharmacist)  Recent office visits: 06/28/2021 - Fam Med (Dr CoplaLorelei Ponte 2 DM / hypogonadism and mixes hyperlipidemia. No medication changes noted.   03/12/2021 Fam Med (Dr CoplaLorelei Ponteral follow up visit. Labs ordered. Follow up in 6 months.  Recent consult visits: None in previous 6 months.   Hospital visits: None in previous 6 months  Objective:  Lab Results  Component Value Date   CREATININE 1.14 03/12/2021   CREATININE 1.16 06/19/2020   CREATININE 1.10 07/01/2019    Lab Results  Component Value Date   HGBA1C 7.3 (H) 06/28/2021   Last diabetic Eye exam:  Lab Results  Component Value Date/Time   HMDIABEYEEXA No Retinopathy 05/17/2021 12:00 AM    Last diabetic Foot exam: No results found for: HMDIABFOOTEX      Component Value Date/Time   CHOL 236 (H) 03/12/2021 1006   TRIG 176.0 (H) 03/12/2021 1006   HDL 53.30 03/12/2021 1006   CHOLHDL 4 03/12/2021 1006   VLDL 35.2 03/12/2021 1006   LDLCALC 147 (H) 03/12/2021 1006   LDLDIRECT 146.0 07/01/2019 0904    Hepatic Function Latest Ref Rng & Units 03/12/2021 06/19/2020 07/01/2019  Total Protein 6.0 - 8.3 g/dL 7.0 6.3 6.5  Albumin 3.5 - 5.2 g/dL 4.5 4.3 4.3  AST 0 - 37 U/L '22 21 22  ' ALT 0 - 53 U/L 35 31 41  Alk Phosphatase 39 - 117 U/L 50 50 50  Total Bilirubin 0.2 - 1.2 mg/dL 0.8 0.6 0.7    No results found for: TSH, FREET4  CBC Latest Ref Rng & Units 03/12/2021 06/19/2020 07/01/2019  WBC 4.0 - 10.5 K/uL 5.6 5.2 6.4  Hemoglobin 13.0 - 17.0 g/dL 15.7 15.0 16.1  Hematocrit 39.0 - 52.0 % 45.2  44.9 45.8  Platelets 150.0 - 400.0 K/uL 194.0 230.0 224.0    No results found for: VD25OH  Clinical ASCVD: No  The 10-year ASCVD risk score (Arnett DK, et al., 2019) is: 34.7%   Values used to calculate the score:     Age: 80 years     Sex: Male     Is Non-Hispanic African American: No     Diabetic: Yes     Tobacco smoker: No     Systolic Blood Pressure: 630 mmHg     Is BP treated: No     HDL Cholesterol: 53.3 mg/dL     Total Cholesterol: 236 mg/dL      Social History   Tobacco Use  Smoking Status Former  Smokeless Tobacco Never  Tobacco Comments   smoked 30  years ago.   BP Readings from Last 3 Encounters:  06/28/21 (!) 142/80  03/12/21 120/88  06/19/20 114/62   Pulse Readings from Last 3 Encounters:  06/28/21 76  03/12/21 74  06/19/20 70   Wt Readings from Last 3 Encounters:  06/28/21 220 lb (99.8 kg)  06/20/21 215 lb (97.5 kg)  03/12/21 222 lb (100.7 kg)    Assessment: Review of patient past medical history, allergies, medications, health status, including review of consultants reports, laboratory and other test data, was performed as part of comprehensive evaluation and provision of chronic care management services.   SDOH:  (Social Determinants of Health) assessments and interventions performed:  SDOH Interventions    Flowsheet Row Most Recent Value  SDOH Interventions   Financial Strain Interventions Other (Comment)  [performed forumlary review for testostone options since his current testsonsterone injection is not being covered at [facility]  Allergen Reactions   Statins Other (See Comments)    We have tried several, cannot tolerate  - myalgias    Medications Reviewed Today     Reviewed by Cherre Robins, RPH-CPP (Pharmacist) on 08/31/21 at 0459  Med List Status: <None>   Medication Order Taking? Sig Documenting Provider Last Dose Status Informant  cyclobenzaprine (FLEXERIL) 10 MG tablet [id number] Yes Take 1 tablet (10 mg total) by mouth daily as needed for muscle spasms. Copland, Gay Filler, MD Taking Active   dapagliflozin propanediol (FARXIGA) 10 MG TABS tablet [id number] Yes Take 1 tablet (10 mg total) by mouth daily.  Patient taking differently: Take 5 mg by mouth daily.   Copland, Gay Filler, MD Taking Active   l-methylfolate-B6-B12 [facility]) 3-35-2 MG TABS [id number] Yes Take 1 tablet by mouth daily. Dohmeier, Asencion Partridge, MD Taking Active            Med Note Quinn Axe Nov 01, 2019 11:53 AM) Emilio Math  metFORMIN (GLUCOPHAGE) 500 MG tablet [id number] Yes TAKE 1 TABLET BY MOUTH EVERY  MORNING AND TAKE 2 TABLETS EVERY EVENING  Patient taking differently: Take 500 mg by mouth daily with breakfast.   Copland, Gay Filler, MD Taking Active   Multiple Vitamin (MULTIVITAMIN) tablet [id number] Yes Take 1 tablet by mouth daily. [provider] Taking Active            Med Note Quinn Axe Nov 01, 2019 11:55 AM) Centrum Silver  NEEDLE, DISP, 66 G (B-D HYPODERMIC NEEDLE 23GX1") 23G X 1" [place] [id number] Yes 1 each by Does not apply route once a week. Copland, Gay Filler, MD Taking Active   Plant Sterol Stanol-Pantethine (CHOLESTOFF COMPLETE PO) [id number] Yes Take 1 capsule  by mouth daily. [provider] Taking Active   testosterone cypionate (DEPOTESTOSTERONE CYPIONATE) 200 MG/ML injection [id number] Yes INJECT 0.65ML INTRAMUSCULARLY EVERY 10 DAYS. Copland, Gay Filler, MD Taking Active             Patient Active Problem List   Diagnosis Date Noted   Statin myopathy 09/27/2020   Left knee pain 06/06/2016   Peripheral sensory neuropathy due to type 2 diabetes mellitus ([place]) 05/17/2014   Snoring 05/17/2014   Obesity 05/17/2014   Diabetes mellitus ([place]) 09/23/2011   Hypogonadism male 09/23/2011   Hyperlipidemia 09/23/2011    Immunization History  Administered Date(s) Administered   Influenza, High Dose Seasonal PF 06/06/2018, 04/27/2019   Influenza,inj,Quad PF,6+ Mos 05/16/2014   Influenza-Unspecified 05/09/2020, 04/12/2021   PFIZER Comirnaty(Gray Top)Covid-19 Tri-Sucrose Vaccine 11/13/2020   PFIZER(Purple Top)SARS-COV-2 Vaccination 09/03/2019, 09/24/2019, 05/09/2020   PPD Test 09/23/2011   Pfizer Covid-19 Vaccine Bivalent Booster 15yr & up 05/12/2021   Pneumococcal Conjugate-13 10/08/2018   Pneumococcal Polysaccharide-23 10/20/2013, 06/19/2020   Tdap 10/20/2013   Zoster Recombinat (Shingrix) 06/05/2020, 08/21/2020    Conditions to be addressed/monitored: HLD, DMII, and low testosterone  Care Plan : General Pharmacy (Adult)  Updates made by  ECherre Robins RPH-CPP since 09/04/2021 12:00 AM     Problem: low testosterone, HDL; type 2 DM   Priority: Medium     Long-Range Goal: Manage type 2 DM and other chornic conditions   Recent Progress: On track  Priority: Medium  Note:   Current Barriers:  Unable to achieve control of hyperlipidemia  Cost of testosterone  Pharmacist Clinical Goal(s):  Over the next 180 days, patient will achieve control of hyperlipidemia as evidenced by LDL <100 Identify lower cost testosterone treatment options  through collaboration with PharmD and provider.   Interventions: 1:1 collaboration with Copland, JGay Filler MD regarding development and update of comprehensive plan of care as evidenced by provider attestation and co-signature Inter-disciplinary care team collaboration (see longitudinal plan of care) Comprehensive medication review performed; medication list updated in electronic medical record  Hyperlipidemia LDL not at goal:  LDL goal < 100 Statin intolerant - has tried several statins in past and all cause myalgias.  Current regimen:  Cholestoff - 9011mof sterols Fish oil 120015m capsules each morning and 1 capsule each evening Interventions: Discussed LDL goals and importance of reducing risk of heart attack and stroke Consider starting ezetimibe 68m52mily if next LDL is greater than 100 Removed CoEmzyme Q10 from med list since he is no longer taking Continue current cholesterol medication regimen. Continue to exercise regularly with goal of at least 150 minutes of physical activity per week.   Diabetes Not quite at goal but improved; A1c goal <7% Current regimen:  Metformin 500mg52m tablet each morning and 2 tablets in evening with food per medication list but only taking 1 tablet with evening meal currently Farxiga 68mg 89mke 0.5 tablet = 5mg da55m  Per patient he was not able to tolerate higher dose of metformin due to GI side effects. Discussed if more blood glucose  lowering needed in future could try extended release metformin and titrate slowly to see if better tolerated.  He tried 68mg of47mxiga but lowered dose due to side effect of frequent urination.  Discussed GLP1s in past with Dr Copland Lorelei Pontient declined due to cost.  Patient is following low CHO diet most of the time though   Lost down to his goal weight of 210lbs in January of 2022 but  has increased to 215 lbs. Has restarted intermittent fasting diet.  Interventions: Discussed importance of diet and exercise Need to update metformin and [place] scripts with next refill Continue to limit intake of high sugar foods and high carbohydrate foods Exercise regularly with goal of at least 150 minutes of physical activity per week.   Low Testosterone:  Controlled; goal: maintain testosterone level in therapeutic range and decrease fatigue and other symptoms associated with low testosterone Patient voiced concern regarding cost of testosterone therapy. For 2022 testosterone therapy was not covered on his Medicare plan so he was using GoodRx card to purchase at $21 per month. Checked 2023 formulary for [facility] and it looks like several testosterone products are now covered. Testosterone Injectable $5 per month - called his pharmacy and they reran Rx from 08/18/2021 and cost was $5! Also discussed with a patient that some topical testosterone products are covered:  Generic Axiron - testosterone 62m/1.5mL solution will be $5 per month (recommended dose 329munder each arm up to 12060motal) Generic Fortesta and Vogelox - testosterone 91m50m 50mg23m 1.25g of gel would be $47 per month.  Patient would like to stick with injectable for now but might consider generic Axiron in future.  Current regimen:  Testosterone injection - inject 0.65 mL into muscle every 10 days. Interventions: Discussed cost of testosterone therapy and reviewed 2023 Home[place]osterone injection is $109  [place] [facility] with [facility] to [facility] - cost for injectable testosterone was $5  Continue current therapy for low testosterone  Medication management Goal: maintain optimal medication adherence [facility] Interventions Comprehensive medication review performed. Continue current medication management strategy   Patient Goals/Self-Care Activities Over the next 180 days, patient will:  take medications as prescribed, check glucose daily , document, and provide at future appointments, and target a minimum of 150 minutes of moderate intensity exercise weekly  Follow Up Plan: Telephone follow up appointment with care management team member scheduled for:  3 months appt with pharmD        Medication Assistance: None required.  Patient affirms current coverage meets needs.  [facility]'s [facility] pharmacy [facility]  [id number] [address] [place] [address]: [phone] Up:  Patient agrees to Care Plan and Follow-up.  Plan: Telephone follow up appointment with care management team member scheduled for:  6 months with clinical pharamcist  Arlene Genova Cherre RobinsmD Clinical Pharmacist [phone]

## 2021-09-04 NOTE — Patient Instructions (Addendum)
Mr. Faulkenberry It was a pleasure speaking with you today.  I have attached a summary of our visit today and information about your health goals.  I [facility] and had them rerun testosterone on your [employer] plan - it is now $5 per month for 2023.  They should have called you about getting a refund.   Our next appointment is by telephone on Dec 10, 2021 at 1:45pm  Please call the care guide team at [phone] if you need to cancel or reschedule your appointment.    If you have any questions or concerns, please feel free to contact me either at the phone number below or with a MyChart message.   Keep up the good work!  Cherre Robins, PharmD Clinical Pharmacist [facility] [place] [phone] (direct line)  [phone] (main office number)  CARE PLAN ENTRY    Hyperlipidemia Lab Results  Component Value Date/Time   LDLCALC 147 (H) 03/12/2021 10:06 AM   LDLDIRECT 146.0 07/01/2019 09:04 AM   Pharmacist Clinical Goal(s): Over the next 90 days, patient will work with PharmD and providers to achieve LDL goal < 100 Current regimen:  Cholestoff - 900mg  of sterols Coenzyme Q10 daily Fish oil 1200mg  2AM, 1PM  Interventions: Discussed LDL goals and importance of reducing risk of heart attack and stroke Consider starting ezetimibe 10mg  daily if next lipid panel is greater than 100 Patient self care activities - Over the next 90 days, patient will: Maintain cholesterol medication regimen.  Diabetes Lab Results  Component Value Date/Time   HGBA1C 7.3 (H) 06/28/2021 09:52 AM   HGBA1C 7.8 (H) 03/12/2021 10:06 AM   Pharmacist Clinical Goal(s): Over the next 90 days, patient will work with PharmD and providers to maintain A1c goal <7% Current regimen:  Metformin 500mg  - 1 tablet each morning and 2 tablets in evening with food per medication list but only taking 1 tablet with evening meal currently Farxiga 10mg  - take 0.5 tablet = 5mg  daily   Interventions: Discussed importance of diet and exercise Need to update metformin script with next refill Patient self care activities - Over the next 90 days, patient will: Maintain A1c less than 7% Continue to limit intake of high sugar and carbohydrate foods Exercise regularly with goal of at least 150 minutes of physical activity per week.   Low Testosterone:  Pharmacist Clinical Goal(s): Over the next 90 days, patient will work with PharmD and providers to maintain testosterone level in therapeutic range and decrease fatigue and other symptoms associated with low testosterone Current regimen:  Testosterone injection - inject 0.65 mL into muscle every 10 days. Interventions: Discussed cost of testosterone therapy - injection is still least expensive compare to patches.  Patient self care activities - Over the next 90 days, patient will:    Continue current therapy for low testosterone  Medication management Pharmacist Clinical Goal(s): Over the next 90 days, patient will work with PharmD and providers to maintain optimal medication adherence [facility] Comprehensive medication review performed. Continue current medication management strategy Patient self care activities - Over the next 90 days, patient will: Focus on medication adherence by filling and taking medications appropriately  Take medications as prescribed Report any questions or concerns to PharmD and/or provider(s)  Patient verbalizes understanding of instructions and care plan provided today and agrees to view in [place]. Active MyChart status confirmed with patient.

## 2021-09-11 DIAGNOSIS — Z7984 Long term (current) use of oral hypoglycemic drugs: Secondary | ICD-10-CM

## 2021-09-11 DIAGNOSIS — E785 Hyperlipidemia, unspecified: Secondary | ICD-10-CM

## 2021-09-11 DIAGNOSIS — E1169 Type 2 diabetes mellitus with other specified complication: Secondary | ICD-10-CM

## 2021-09-14 ENCOUNTER — Encounter: Payer: Self-pay | Admitting: Family Medicine

## 2021-09-14 MED ORDER — "BD HYPODERMIC NEEDLE 23G X 1"" MISC": 1.0000 | 3 refills | Status: DC

## 2021-10-03 DIAGNOSIS — B349 Viral infection, unspecified: Secondary | ICD-10-CM | POA: Diagnosis not present

## 2021-10-07 NOTE — Progress Notes (Addendum)
Therapist, music at [facility] ?[address], [address] ?[place], [place] [address] ?336 [phone] ?Fax [phone] ? ?Date:  10/10/2021  ? ?Name:  Randy Orr   DOB:  1951-07-04   MRN:  [id number] ? ?PCP:  [name], MD  ? ? ?Chief Complaint: Follow-up (Check a1c, discuss dosage/schedule of testosterone, routine labs/Concerns/ questions: right hip and knee pain/Foot exam due) ? ? ?History of Present Illness: ? ?Randy Orr is a 71 y.o. very pleasant male patient who presents with the following: ? ?Patient seen today for diabetes follow-up ?Most recent visit with myself was in November- History of diabetes, peripheral neuropathy, hypogonadism, hyperlipidemia ?He is unable to tolerate statins ? ?At our last visit-  ?He is still working on his weight- he has been under some stress and lost some progress he had made. ?They had to put their cat down and then his 41 year old mother fell and broke her hip- she got surgery and went to rehab, now she is back home and is doing okay.  However of course this episode was very stressful ? ?His mom is doing ok and is back home.  Her mentation is not great but not any worse  ?Can update A1c today if he likes ?Lab Results  ?Component Value Date  ? HGBA1C 7.3 (H) 06/28/2021  ? ?Recheck testosterone in November, it was high at that time and we planned a dose reduction of his injectable testosterone ?Pt notes he is feeling "moody and depressed and my ED is worse" ?He is taking 0.65 mg every 10 days currently  ?He might prefer to go back to 1 mg every 14 days instead- he would prefer to have more variation in his levels than feel like he does right now  ? ?He thinks his last shot was perhaps 4 days ago now ?He needs a new rx for his needles  ? ?He also wonders if the testosterone patch might be covered by his insurance.  He would like me to do a prescription so he can find out what it might cost ? ?He has noted some right hip pain mostly at night- and this  seems to have led to some right knee ache as well  ?Both bother him some when he is  ?No particular injury that he can recall ?He did start using a rowing machine recently but just a little bit- he is not doing much  ? ?Patient Active Problem List  ? Diagnosis Date Noted  ? Statin myopathy 09/27/2020  ? Left knee pain 06/06/2016  ? Peripheral sensory neuropathy due to type 2 diabetes mellitus ([place]) 05/17/2014  ? Snoring 05/17/2014  ? Obesity 05/17/2014  ? Diabetes mellitus ([place]) 09/23/2011  ? Hypogonadism male 09/23/2011  ? Hyperlipidemia 09/23/2011  ? ? ?Past Medical History:  ?Diagnosis Date  ? Diabetes mellitus without complication ([place])   ? DM type 2  ? GERD (gastroesophageal reflux disease)   ? Hyperlipidemia   ? Low testosterone   ? Obesity 05/17/2014  ? Peripheral sensory neuropathy due to type 2 diabetes mellitus ([place]) 05/17/2014  ? Snoring 05/17/2014  ? ? ?Past Surgical History:  ?Procedure Laterality Date  ? COLONOSCOPY  07/18/2014  ? ? ?Social History  ? ?Tobacco Use  ? Smoking status: Former  ? Smokeless tobacco: Never  ? Tobacco comments:  ?  smoked 30 years ago.  ?Substance Use Topics  ? Alcohol use: Yes  ?  Alcohol/week: 5.0 standard drinks  ?  Types: 5  Glasses of wine per week  ? Drug use: No  ? ? ?Family History  ?Problem Relation Age of Onset  ? Diabetes Brother   ? Heart attack Father   ? Colon cancer Neg Hx   ? Esophageal cancer Neg Hx   ? Rectal cancer Neg Hx   ? Stomach cancer Neg Hx   ? ? ?Allergies  ?Allergen Reactions  ? Statins Other (See Comments)  ?  We have tried several, cannot tolerate  - myalgias  ? ? ?Medication list has been reviewed and updated. ? ?Current Outpatient Medications on File Prior to Visit  ?Medication Sig Dispense Refill  ? cyclobenzaprine (FLEXERIL) 10 MG tablet Take 1 tablet (10 mg total) by mouth daily as needed for muscle spasms. 30 tablet 0  ? dapagliflozin propanediol (FARXIGA) 10 MG TABS tablet Take 1 tablet (10 mg total) by mouth daily. (Patient taking  differently: Take 5 mg by mouth daily. Taking 1/2 tab daily. 5 mg) 30 tablet 6  ? l-methylfolate-B6-B12 (METANX) 3-35-2 MG TABS Take 1 tablet by mouth daily. 60 tablet 1  ? metFORMIN (GLUCOPHAGE) 500 MG tablet TAKE 1 TABLET BY MOUTH EVERY MORNING AND TAKE 2 TABLETS EVERY EVENING (Patient taking differently: Take 500 mg by mouth at bedtime.) 270 tablet 1  ? Multiple Vitamin (MULTIVITAMIN) tablet Take 1 tablet by mouth daily.    ? Plant Sterol Stanol-Pantethine (CHOLESTOFF COMPLETE PO) Take 1 capsule by mouth daily.    ? testosterone cypionate (DEPOTESTOSTERONE CYPIONATE) 200 MG/ML injection INJECT 0.65ML INTRAMUSCULARLY EVERY 10 DAYS. 9 mL 1  ? ?No current facility-administered medications on file prior to visit.  ? ? ?Review of Systems: ? ?As per HPI- otherwise negative. ? ? ?Physical Examination: ?Vitals:  ? 10/10/21 0835  ?BP: 132/82  ?Pulse: 74  ?Resp: 18  ?Temp: 97.7 ?F (36.5 ?C)  ?SpO2: 98%  ? ?Vitals:  ? 10/10/21 0835  ?Weight: 219 lb 9.6 oz (99.6 kg)  ?Height: 6\' 3"  (1.905 m)  ? ?Body mass index is 27.45 kg/m?. ?Ideal Body Weight: Weight in (lb) to have BMI = 25: 199.6 ? ?GEN: no acute distress.  Tall build, mildly overweight.  Looks well ?HEENT: Atraumatic, Normocephalic.  ?Ears and Nose: No external deformity. ?CV: RRR, No M/G/R. No JVD. No thrill. No extra heart sounds. ?PULM: CTA B, no wheezes, crackles, rhonchi. No retractions. No resp. distress. No accessory muscle use. ?ABD: S, NT, ND, +BS. No rebound. No HSM. ?EXTR: No c/c/e ?PSYCH: Normally interactive. Conversant.  ?Right knee and hip-normal exam, normal range of motion.  Some crepitus of the right knee which is to be expected.  No effusion or laxity ? ?Assessment and Plan: ?Hypogonadism male - Plan: Testosterone Total,Free,Bio, Males-([facility]), Needle, Disp, (HYPODERMIC NEEDLE 23GX1") 23G X 1" MISC, testosterone (ANDRODERM) 4 MG/24HR PT24 patch ? ?Type 2 diabetes mellitus without complication, without long-term current use of insulin ([place]) - Plan:  Hemoglobin A1c ? ?Dyslipidemia ? ?Erectile dysfunction, unspecified erectile dysfunction type - Plan: sildenafil (REVATIO) 20 MG tablet ? ?Screening for prostate cancer - Plan: PSA ? ?Pain in joint of right hip - Plan: DG Hip Unilat W OR W/O Pelvis 2-3 Views Right ? ?Pain and swelling of right knee - Plan: DG Knee Complete 4 Views Right ? ?Following up on a few concerns today ?Most recent testosterone level was too high, we decreased his dosage.  However he now notes symptoms of possible inadequate replacement with ED, depression.  Recheck levels today ?I wrote for Androderm patches-if covered by insurance she  would like to use this instead of injectable ?If not covered we plan to change his testosterone dosage to 1 mg every 2 weeks ? ?Following up on other routine labs, diabetes today ? ?Prescription for generic sildenafil as needed ED ? ?We will obtain plain films of his knee and hip today-May need Ortho referral if significant arthritis ?Suggested Voltaren gel for knee pain ? ?Signed ?Lamar Blinks, MD ? ?Addendum 3/2, received labs as below.  Message to patient ? ?Results for orders placed or performed in visit on 10/10/21  ?Hemoglobin A1c  ?Result Value Ref Range  ? Hgb A1c MFr Bld 7.8 (H) 4.6 - 6.5 %  ?Testosterone Total,Free,Bio, Males-([facility])  ?Result Value Ref Range  ? Testosterone 713 250 - 827 ng/dL  ? Albumin 4.4 3.6 - 5.1 g/dL  ? Sex Hormone Binding 24 22 - 77 nmol/L  ? Testosterone, Free 135.0 46.0 - 224.0 pg/mL  ? Testosterone, Bioavailable 271.7 110.0 - 575.0 ng/dL  ?PSA  ?Result Value Ref Range  ? PSA 2.34 0.10 - 4.00 ng/mL  ? ? ? ?

## 2021-10-10 ENCOUNTER — Other Ambulatory Visit: Payer: Self-pay | Admitting: Family Medicine

## 2021-10-10 ENCOUNTER — Encounter: Payer: Self-pay | Admitting: Family Medicine

## 2021-10-10 ENCOUNTER — Ambulatory Visit
Admission: RE | Admit: 2021-10-10 | Discharge: 2021-10-10 | Disposition: A | Discharge status: Home / Self Care | Payer: Medicare HMO | Source: Ambulatory Visit | Attending: Family Medicine | Admitting: Family Medicine

## 2021-10-10 ENCOUNTER — Ambulatory Visit (INDEPENDENT_AMBULATORY_CARE_PROVIDER_SITE_OTHER): Payer: Medicare HMO | Admitting: Family Medicine

## 2021-10-10 ENCOUNTER — Other Ambulatory Visit: Payer: Self-pay

## 2021-10-10 VITALS — BP 132/82 | HR 74 | Temp 97.7°F | Resp 18 | Ht 75.0 in | Wt 219.6 lb

## 2021-10-10 DIAGNOSIS — M25561 Pain in right knee: Secondary | ICD-10-CM | POA: Diagnosis not present

## 2021-10-10 DIAGNOSIS — E785 Hyperlipidemia, unspecified: Secondary | ICD-10-CM | POA: Diagnosis not present

## 2021-10-10 DIAGNOSIS — M25551 Pain in right hip: Secondary | ICD-10-CM

## 2021-10-10 DIAGNOSIS — N529 Male erectile dysfunction, unspecified: Secondary | ICD-10-CM

## 2021-10-10 DIAGNOSIS — E291 Testicular hypofunction: Secondary | ICD-10-CM | POA: Diagnosis not present

## 2021-10-10 DIAGNOSIS — E119 Type 2 diabetes mellitus without complications: Secondary | ICD-10-CM | POA: Diagnosis not present

## 2021-10-10 DIAGNOSIS — M25461 Effusion, right knee: Secondary | ICD-10-CM

## 2021-10-10 DIAGNOSIS — Z125 Encounter for screening for malignant neoplasm of prostate: Secondary | ICD-10-CM

## 2021-10-10 LAB — TESTOSTERONE TOTAL,FREE,BIO, MALES
Albumin: 4.4 g/dL (ref 3.6–5.1)
Sex Hormone Binding: 24 nmol/L (ref 22–77)
Testosterone, Bioavailable: 271.7 ng/dL (ref 110.0–575.0)
Testosterone, Free: 135 pg/mL (ref 46.0–224.0)
Testosterone: 713 ng/dL (ref 250–827)

## 2021-10-10 LAB — PSA: PSA: 2.34 ng/mL (ref 0.10–4.00)

## 2021-10-10 LAB — HEMOGLOBIN A1C: Hgb A1c MFr Bld: 7.8 % — ABNORMAL HIGH (ref 4.6–6.5)

## 2021-10-10 MED ORDER — TESTOSTERONE 4 MG/24HR TD PT24: 1.0000 | MEDICATED_PATCH | Freq: Every day | TRANSDERMAL | 0 refills | Status: DC

## 2021-10-10 MED ORDER — "HYPODERMIC NEEDLE 23G X 1"" MISC": 1.0000 | 0 refills | Status: DC

## 2021-10-10 MED ORDER — SILDENAFIL CITRATE 20 MG PO TABS: ORAL_TABLET | ORAL | 5 refills | Status: DC

## 2021-10-10 NOTE — Patient Instructions (Signed)
It was good to see you again today-  I will be in touch with your labs asap ?I rx the T patches for you- let me know if this is affordable for you. If not we can go to 1 mg injectable every 2 weeks ? ?Go over to [facility] on [address] at your convenience to have x-rays  ?Address: [address], [place], [place] [address] ?Phone: [phone] ?

## 2021-10-11 ENCOUNTER — Encounter: Payer: Self-pay | Admitting: Family Medicine

## 2021-10-11 DIAGNOSIS — M25461 Effusion, right knee: Secondary | ICD-10-CM

## 2021-10-11 MED ORDER — METFORMIN HCL ER 500 MG PO TB24
1000.0000 mg | ORAL_TABLET | Freq: Every day | ORAL | 3 refills | Status: DC
Start: 2021-10-11 — End: 2022-09-25

## 2021-10-11 MED ORDER — TESTOSTERONE CYPIONATE 200 MG/ML IM SOLN: INTRAMUSCULAR | 1 refills | Status: DC

## 2021-10-11 NOTE — Addendum Note (Signed)
Addended by: [name] on: 10/11/2021 06:38 AM ? ? Modules accepted: Orders ? ?

## 2021-10-11 NOTE — Addendum Note (Signed)
Addended by: Lamar Blinks C on: 10/11/2021 04:19 PM ? ? Modules accepted: Orders ? ?

## 2021-10-16 ENCOUNTER — Ambulatory Visit: Payer: Medicare HMO | Admitting: Orthopaedic Surgery

## 2021-10-16 ENCOUNTER — Other Ambulatory Visit: Payer: Self-pay

## 2021-10-16 ENCOUNTER — Encounter: Payer: Self-pay | Admitting: Orthopaedic Surgery

## 2021-10-16 VITALS — Ht 75.0 in | Wt 219.0 lb

## 2021-10-16 DIAGNOSIS — M25561 Pain in right knee: Secondary | ICD-10-CM | POA: Insufficient documentation

## 2021-10-16 MED ORDER — METHYLPREDNISOLONE ACETATE 40 MG/ML IJ SUSP
40.0000 mg | INTRAMUSCULAR | Status: AC | PRN
  Administered 2021-10-16: 40 mg via INTRA_ARTICULAR

## 2021-10-16 MED ORDER — LIDOCAINE HCL 1 % IJ SOLN
2.0000 mL | INTRAMUSCULAR | Status: AC | PRN
  Administered 2021-10-16: 2 mL

## 2021-10-16 MED ORDER — BUPIVACAINE HCL 0.5 % IJ SOLN
2.0000 mL | INTRAMUSCULAR | Status: AC | PRN
  Administered 2021-10-16: 2 mL via INTRA_ARTICULAR

## 2021-10-16 NOTE — Progress Notes (Signed)
? ?Office Visit Note ?  ?Patient: Randy Orr           ?Date of Birth: 02-02-1951           ?MRN: [id number] ?Visit Date: 10/16/2021 ?             ?Requested by: [name], MD ?[facility] ?[address] ?[place],  [place] [address] ?PCP: [name], MD ? ? ?Assessment & Plan: ?Visit Diagnoses:  ?1. Acute pain of right knee   ? ? ?Plan: Impression is right knee medial meniscus tear.  Treatment options were reviewed and we decided to move forward with a cortisone injection with and 6 weeks of relative rest.  He should wear knee sleeve during activity.  Neck step would be MRI if he does not feel any improvement from this.  I reviewed the x-rays and given the location of the ossifications these should not be actual loose bodies that are free-floating and are likely embedded in the soft tissues. ? ?Follow-Up Instructions: No follow-ups on file.  ? ?Orders:  ?No orders of the defined types were placed in this encounter. ? ?No orders of the defined types were placed in this encounter. ? ? ? ? Procedures: ?Large Joint Inj: R knee on 10/16/2021 11:48 AM ?Indications: pain ?Details: 22 G needle ? ?Arthrogram: No ? ?Medications: 40 mg methylPREDNISolone acetate 40 MG/ML; 2 mL lidocaine 1 %; 2 mL bupivacaine 0.5 % ?Consent was given by the patient. Patient was prepped and draped in the usual sterile fashion.  ? ? ? ? ?Clinical Data: ?No additional findings. ? ? ?Subjective: ?Chief Complaint  ?Patient presents with  ? Right Knee - Pain  ? ? ?HPI ? ?Randy Orr is a very pleasant 71 year old gentleman referral from Dr. Jackie Plum his PCP for right knee pain for several weeks without any injuries or trauma.  He feels a tooth achy pain has pain when he is pivoting and stooping over.  He had an MRI recently that showed some posterior knee ossifications concerning for loose body.  He had a remote injury to the right knee over 2 decades ago.  He is noted some decreased range of motion and feels tightness past 90  degrees. ? ?Review of Systems  ?Constitutional: Negative.   ?All other systems reviewed and are negative. ? ? ?Objective: ?Vital Signs: Ht '6\' 3"'$  (1.905 m)   Wt 219 lb (99.3 kg)   BMI 27.37 kg/m?  ? ?Physical Exam ?Vitals and nursing note reviewed.  ?Constitutional:   ?   Appearance: He is well-developed.  ?HENT:  ?   Head: Normocephalic and atraumatic.  ?Eyes:  ?   Pupils: Pupils are equal, round, and reactive to light.  ?Pulmonary:  ?   Effort: Pulmonary effort is normal.  ?Abdominal:  ?   Palpations: Abdomen is soft.  ?Musculoskeletal:     ?   General: Normal range of motion.  ?   Cervical back: Neck supple.  ?Skin: ?   General: Skin is warm.  ?Neurological:  ?   Mental Status: He is alert and oriented to person, place, and time.  ?Psychiatric:     ?   Behavior: Behavior normal.     ?   Thought Content: Thought content normal.     ?   Judgment: Judgment normal.  ? ? ?Ortho Exam ? ?Examination of the right knee shows a trace joint effusion.  There is medial joint line tenderness and pain with McMurray maneuver.  Collaterals  and cruciates are stable.  No significant patellofemoral crepitus with range of motion.  Range of motion is well-preserved. ? ?Specialty Comments:  ?No specialty comments available. ? ?Imaging: ?No results found. ? ? ?PMFS History: ?Patient Active Problem List  ? Diagnosis Date Noted  ? Statin myopathy 09/27/2020  ? Left knee pain 06/06/2016  ? 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 Snoring 05/17/2014  ? Obesity 05/17/2014  ? Diabetes mellitus ([place]) 09/23/2011  ? Hypogonadism male 09/23/2011  ? Hyperlipidemia 09/23/2011  ? ?Past Medical History:  ?Diagnosis Date  ? Diabetes mellitus without complication ([place])   ? DM type 2  ? GERD (gastroesophageal reflux disease)   ? Hyperlipidemia   ? Low testosterone   ? Obesity 05/17/2014  ? Peripheral sensory neuropathy due to type 2 diabetes mellitus ([place]) 05/17/2014  ? Snoring 05/17/2014  ?  ?Family History   ?Problem Relation Age of Onset  ? Diabetes Brother   ? Heart attack Father   ? Colon cancer Neg Hx   ? Esophageal cancer Neg Hx   ? Rectal cancer Neg Hx   ? Stomach cancer Neg Hx   ?  ?Past Surgical History:  ?Procedure Laterality Date  ? COLONOSCOPY  07/18/2014  ? ?Social History  ? ?Occupational History  ? Occupation: SECURITY OFFICER  ?  Employer: [employer]  ?Tobacco Use  ? Smoking status: Former  ? Smokeless tobacco: Never  ? Tobacco comments:  ?  smoked 30 years ago.  ?Substance and Sexual Activity  ? Alcohol use: Yes  ?  Alcohol/week: 5.0 standard drinks  ?  Types: 5 Glasses of wine per week  ? Drug use: No  ? Sexual activity: Not on file  ? ? ? ? ? ? ?

## 2021-10-17 DIAGNOSIS — H00012 Hordeolum externum right lower eyelid: Secondary | ICD-10-CM | POA: Diagnosis not present

## 2021-10-25 ENCOUNTER — Encounter: Payer: Self-pay | Admitting: Orthopaedic Surgery

## 2021-10-25 ENCOUNTER — Ambulatory Visit: Payer: Medicare HMO | Admitting: Orthopaedic Surgery

## 2021-10-25 ENCOUNTER — Other Ambulatory Visit: Payer: Self-pay

## 2021-10-25 VITALS — Ht 75.0 in | Wt 219.0 lb

## 2021-10-25 DIAGNOSIS — M25561 Pain in right knee: Secondary | ICD-10-CM | POA: Diagnosis not present

## 2021-10-25 NOTE — Progress Notes (Signed)
? ?Office Visit Note ?  ?Patient: Randy Orr           ?Date of Birth: [birth year]           ?MRN: [id number] ?Visit Date: 10/25/2021 ?             ?Requested by: [name], MD ?[facility] ?[address] ?[place],  [place] [address] ?PCP: [name], MD ? ? ?Assessment & Plan: ?Visit Diagnoses:  ?1. Acute pain of right knee   ? ? ?Plan: Mr. Lysaght is following up for acute worsening of right knee pain.  He was opening a glass door when he felt severe and painful pop in his right knee and since that he has not been able to weight-bear.  He has been ambulating with crutches.  The cortisone injection that we did last week helped with a constant throb but he continues to have the sharp stabbing pain that is preventing him from weightbearing. ? ?Examination of right knee shows painful range of motion.  He has pain through the back of the knee as well as on the medial side of the knee.  No joint effusion.  He is unable to bear weight with ambulation. ? ?In light of new events we will need an MRI to rule out structural abnormalities particularly a large meniscus tear.  I will contact the patient through MyChart regarding the MRI results and next steps. ? ?Follow-Up Instructions: No follow-ups on file.  ? ?Orders:  ?No orders of the defined types were placed in this encounter. ? ?No orders of the defined types were placed in this encounter. ? ? ? ? Procedures: ?No procedures performed ? ? ?Clinical Data: ?No additional findings. ? ? ?Subjective: ?Chief Complaint  ?Patient presents with  ? Right Knee - Pain, Follow-up  ? ? ?HPI ? ?Review of Systems ? ? ?Objective: ?Vital Signs: Ht '6\' 3"'$  (1.905 m)   Wt 219 lb (99.3 kg)   BMI 27.37 kg/m?  ? ?Physical Exam ? ?Ortho Exam ? ?Specialty Comments:  ?No specialty comments available. ? ?Imaging: ?No results found. ? ? ?PMFS History: ?Patient Active Problem List  ? Diagnosis Date Noted  ? Acute pain of right knee 10/16/2021  ? Statin myopathy 09/27/2020  ? Left knee  pain 06/06/2016  ? Peripheral sensory neuropathy due to type 2 diabetes mellitus ([place]) 05/17/2014  ? Snoring 05/17/2014  ? Obesity 05/17/2014  ? Diabetes mellitus ([facility] [place]) 09/23/2011  ? Hypogonadism male 09/23/2011  ? Hyperlipidemia 09/23/2011  ? ?Past Medical History:  ?Diagnosis Date  ? Diabetes mellitus without complication ([facility])   ? DM type 2  ? GERD (gastroesophageal reflux disease)   ? Hyperlipidemia   ? Low testosterone   ? Obesity 05/17/2014  ? Peripheral sensory neuropathy due to type 2 diabetes mellitus ([place]) 05/17/2014  ? Snoring 05/17/2014  ?  ?Family History  ?Problem Relation Age of Onset  ? Diabetes Brother   ? Heart attack Father   ? Colon cancer Neg Hx   ? Esophageal cancer Neg Hx   ? Rectal cancer Neg Hx   ? Stomach cancer Neg Hx   ?  ?Past Surgical History:  ?Procedure Laterality Date  ? COLONOSCOPY  07/18/2014  ? ?Social History  ? ?Occupational History  ? Occupation: SECURITY OFFICER  ?  Employer: [employer]  ?Tobacco Use  ? Smoking status: Former  ? Smokeless tobacco: Never  ? Tobacco comments:  ?  smoked 30 years ago.  ?Substance and  Sexual Activity  ? Alcohol use: Yes  ?  Alcohol/week: 5.0 standard drinks  ?  Types: 5 Glasses of wine per week  ? Drug use: No  ? Sexual activity: Not on file  ? ? ? ? ? ? ?

## 2021-10-28 ENCOUNTER — Encounter: Payer: Self-pay | Admitting: Family Medicine

## 2021-10-28 DIAGNOSIS — E291 Testicular hypofunction: Secondary | ICD-10-CM

## 2021-10-28 MED ORDER — "HYPODERMIC NEEDLE 23G X 1"" MISC": 1 refills | Status: AC

## 2021-10-31 ENCOUNTER — Other Ambulatory Visit: Payer: Medicare HMO

## 2021-10-31 ENCOUNTER — Ambulatory Visit
Admission: RE | Admit: 2021-10-31 | Discharge: 2021-10-31 | Disposition: A | Discharge status: Home / Self Care | Payer: Medicare HMO | Source: Ambulatory Visit | Attending: Orthopaedic Surgery | Admitting: Orthopaedic Surgery

## 2021-10-31 ENCOUNTER — Other Ambulatory Visit: Payer: Self-pay

## 2021-10-31 DIAGNOSIS — S83241A Other tear of medial meniscus, current injury, right knee, initial encounter: Secondary | ICD-10-CM | POA: Diagnosis not present

## 2021-10-31 DIAGNOSIS — M25561 Pain in right knee: Secondary | ICD-10-CM

## 2021-10-31 DIAGNOSIS — M1711 Unilateral primary osteoarthritis, right knee: Secondary | ICD-10-CM | POA: Diagnosis not present

## 2021-11-01 ENCOUNTER — Ambulatory Visit: Payer: Medicare HMO | Admitting: Family Medicine

## 2021-11-02 ENCOUNTER — Encounter: Payer: Self-pay | Admitting: Orthopaedic Surgery

## 2021-11-30 DIAGNOSIS — S83241A Other tear of medial meniscus, current injury, right knee, initial encounter: Secondary | ICD-10-CM | POA: Diagnosis not present

## 2021-12-10 ENCOUNTER — Ambulatory Visit (INDEPENDENT_AMBULATORY_CARE_PROVIDER_SITE_OTHER): Payer: Medicare HMO | Admitting: Pharmacist

## 2021-12-10 DIAGNOSIS — E119 Type 2 diabetes mellitus without complications: Secondary | ICD-10-CM

## 2021-12-10 DIAGNOSIS — E291 Testicular hypofunction: Secondary | ICD-10-CM

## 2021-12-10 DIAGNOSIS — E785 Hyperlipidemia, unspecified: Secondary | ICD-10-CM

## 2021-12-10 NOTE — Chronic Care Management (AMB) (Signed)
? ? ?Chronic Care Management ?Pharmacy Note ? ?12/10/2021 ?Name:  Randy Orr MRN:  [id number] DOB:  26-Mar-1951 ? ?Summary:  ?Patient has been able to get testosterone Injectable $5 per month (assisted with formulary review at last visit) Also for 2023: ? Generic Axiron - testosterone 55m/1.5mL solution will be $5 per month (recommended dose 334munder each arm up to 12066motal) ? Generic Fortesta and Vogelox - testosterone 5m53m 50mg19m 1.25g of gel would be $47 per month.  ? ?Patient would like to stick with injectable. Last testosterone was upper end of normal.  ?Discussed diabetes and metformin dose. Metformin was recently increased to metformin XR 1000mg 37m morning. He asked if he could take in evening instead because he takes several vitamins in the morning. Ok to take metformin XR 1000mg w76mevening meal. Also taking Farxiga 5mg- 086mablet = 5mg dail15m?Noted to be intolerant to statins. Patient declined to retry. If next LDL above goal of <100, consider addition of ezetimibe.  ?  ? ?Subjective: ?Randy Orr y.o. 71r old male who is a primary patient of Copland, Jessica CGay Fillere CCM team was consulted for assistance with disease management and care coordination needs.   ? ?Engaged with patient by telephone for follow up visit in response to provider referral for pharmacy case management and/or care coordination services.  ? ?Consent to Services:  ?The patient was given information about Chronic Care Management services, agreed to services, and gave verbal consent prior to initiation of services.  Please see initial visit note for detailed documentation.  ? ?Patient Care Team: ?Copland, Jessica CGay FillerCP - General (Family Medicine) ?Mackenna Kamer, TCherre Robins (Pharmacist) ? ?Recent office visits: ?10/10/2021 - Fam Med (Dr Copland) Lorelei Pontonic conditions and labs.  ?10/03/2021 - Fam Med (Dr Ross) [facility] for viral infection. ?06/28/2021 - Fam Med (Dr Copland) Lorelei PontDM / hypogonadism  and mixes hyperlipidemia. No medication changes noted.  ? ?Recent consult visits: ?10/25/2021 - Ortho (Dr Xu) acuteErlinda Hongain in right knee. Knee popped recently. Unable to weight bear, using crutches. Ordered MRI. Noted radial tear in menuscus. Recommended arthroscopic surgery to repair. Patient reported later that pain had improved and decided not to get surgery. ?10/16/2021 - Ortho (Dr Xu) acuteErlinda Hongain in right knee. Noted medial menuscus tear. Considering cortisone injections and 6 weeks of rest.  ? ?Hospital visits: ?None in previous 6 months ? ?Objective: ? ?Lab Results  ?Component Value Date  ? CREATININE 1.14 03/12/2021  ? CREATININE 1.16 06/19/2020  ? CREATININE 1.10 07/01/2019  ? ? ?Lab Results  ?Component Value Date  ? HGBA1C 7.8 (H) 10/10/2021  ? ?Last diabetic Eye exam:  ?Lab Results  ?Component Value Date/Time  ? HMDIABEYEEXA No Retinopathy 05/17/2021 12:00 AM  ?  ?Last diabetic Foot exam: No results found for: HMDIABFOOTEX  ? ?   ?Component Value Date/Time  ? CHOL 236 (H) 03/12/2021 1006  ? TRIG 176.0 (H) 03/12/2021 1006  ? HDL 53.30 03/12/2021 1006  ? CHOLHDL 4 03/12/2021 1006  ? VLDL 35.2 03/12/2021 1006  ? LDLCALC 147 (H) 03/12/2021 1006  ? LDLDIRECT 146.0 07/01/2019 0904  ? ? ? ?  Latest Ref Rng & Units 03/12/2021  ? 10:06 AM 06/19/2020  ?  8:45 AM 07/01/2019  ?  9:04 AM  ?Hepatic Function  ?Total Protein 6.0 - 8.3 g/dL 7.0   6.3   6.5    ?Albumin 3.5 - 5.2 g/dL 4.5   4.3  4.3    ?AST 0 - 37 U/L '22   21   22    ' ?ALT 0 - 53 U/L 35   31   41    ?Alk Phosphatase 39 - 117 U/L 50   50   50    ?Total Bilirubin 0.2 - 1.2 mg/dL 0.8   0.6   0.7    ? ? ?No results found for: TSH, FREET4 ? ? ?  Latest Ref Rng & Units 03/12/2021  ? 10:06 AM 06/19/2020  ?  8:45 AM 07/01/2019  ?  9:04 AM  ?CBC  ?WBC 4.0 - 10.5 K/uL 5.6   5.2   6.4    ?Hemoglobin 13.0 - 17.0 g/dL 15.7   15.0   16.1    ?Hematocrit 39.0 - 52.0 % 45.2   44.9   45.8    ?Platelets 150.0 - 400.0 K/uL 194.0   230.0   224.0    ? ? ?No results found for:  VD25OH ? ?Clinical ASCVD: No  ?The 10-year ASCVD risk score (Arnett DK, et al., 2019) is: 39.9% ?  Values used to calculate the score: ?    Age: 71 years ?    Sex: Male ?    Is Non-Hispanic African American: No ?    Diabetic: Yes ?    Tobacco smoker: No ?    Systolic Blood Pressure: 497 mmHg ?    Is BP treated: No ?    HDL Cholesterol: 53.3 mg/dL ?    Total Cholesterol: 236 mg/dL   ? ? ? ?Social History  ? ?Tobacco Use  ?Smoking Status Former  ?Smokeless Tobacco Never  ?Tobacco Comments  ? smoked 30 years ago.  ? ?BP Readings from Last 3 Encounters:  ?10/10/21 132/82  ?06/28/21 (!) 142/80  ?03/12/21 120/88  ? ?Pulse Readings from Last 3 Encounters:  ?10/10/21 74  ?06/28/21 76  ?03/12/21 74  ? ?Wt Readings from Last 3 Encounters:  ?10/25/21 219 lb (99.3 kg)  ?10/16/21 219 lb (99.3 kg)  ?10/10/21 219 lb 9.6 oz (99.6 kg)  ? ? ?Assessment: Review of patient past medical history, allergies, medications, health status, including review of consultants reports, laboratory and other test data, was performed as part of comprehensive evaluation and provision of chronic care management services.  ? ?SDOH:  (Social Determinants of Health) assessments and interventions performed:  ? ? ? ?CCM Care Plan ? ?Allergies  ?Allergen Reactions  ? Statins Other (See Comments)  ?  We have tried several, cannot tolerate  - myalgias  ? ? ?Medications Reviewed Today   ? ? Reviewed by Lendon Collar, RT (Technologist) on 10/25/21 at [place] List Status: <None>  ? ?Medication Order Taking? Sig Documenting Provider Last Dose Status Informant  ?cyclobenzaprine (FLEXERIL) 10 MG tablet [id number] Yes Take 1 tablet (10 mg total) by mouth daily as needed for muscle spasms. Copland, Gay Filler, MD Taking Active   ?dapagliflozin propanediol (FARXIGA) 10 MG TABS tablet [id number] Yes Take 1 tablet (10 mg total) by mouth daily.  ?Patient taking differently: Take 5 mg by mouth daily. Taking 1/2 tab daily. 5 mg  ? Copland, Gay Filler, MD Taking Active    ?l-methylfolate-B6-B12 (METANX) 3-35-2 MG TABS [id number] Yes Take 1 tablet by mouth daily. Dohmeier, Asencion Partridge, MD Taking Active   ?         ?Med Note Quinn Axe Nov 01, 2019 11:53 AM) Emilio Math  ?metFORMIN (GLUCOPHAGE-XR) 500 MG 24 hr tablet [id number] Yes Take  2 tablets (1,000 mg total) by mouth daily with breakfast. Copland, Gay Filler, MD Taking Active   ?Multiple Vitamin (MULTIVITAMIN) tablet [id number] Yes Take 1 tablet by mouth daily. [provider] Taking Active   ?         ?Med Note Quinn Axe Nov 01, 2019 11:55 AM) Janalee Dane Silver  ?Needle, Disp, (HYPODERMIC NEEDLE 23GX1") 23G X 1" MISC [id number] Yes 1 each by Does not apply route once a week. Copland, Gay Filler, MD Taking Active   ?Plant Sterol Stanol-Pantethine (CHOLESTOFF COMPLETE PO) [id number] Yes Take 1 capsule by mouth daily. [provider] Taking Active   ?sildenafil (REVATIO) 20 MG tablet [id number] Yes USE 1-5 TABLETS BY MOUTH DAILY AS NEEDED FOR ED Copland, Gay Filler, MD Taking Active   ?testosterone cypionate (DEPOTESTOSTERONE CYPIONATE) 200 MG/ML injection [id number] Yes INJECT 1 ML(226m) INTRAMUSCULARLY EVERY 14 DAYS. Copland, JGay Filler MD Taking Active   ? ?  ?  ? ?  ? ? ?Patient Active Problem List  ? Diagnosis Date Noted  ? Acute pain of right knee 10/16/2021  ? Statin myopathy 09/27/2020  ? Left knee pain 06/06/2016  ? Peripheral sensory neuropathy due to type 2 diabetes mellitus ([place] 05/17/2014  ? Snoring 05/17/2014  ? Obesity 05/17/2014  ? Diabetes mellitus ([place] 09/23/2011  ? Hypogonadism male 09/23/2011  ? Hyperlipidemia 09/23/2011  ? ? ?Immunization History  ?Administered Date(s) Administered  ? Influenza, High Dose Seasonal PF 06/06/2018, 04/27/2019  ? Influenza,inj,Quad PF,6+ Mos 05/16/2014  ? Influenza-Unspecified 05/09/2020, 04/12/2021  ? PFIZER Comirnaty(Gray Top)Covid-19 Tri-Sucrose Vaccine 11/13/2020  ? PFIZER(Purple Top)SARS-COV-2 Vaccination 09/03/2019, 09/24/2019, 05/09/2020  ? PPD Test  09/23/2011  ? PPension scheme manager116yr& up 05/12/2021  ? Pneumococcal Conjugate-13 10/08/2018  ? Pneumococcal Polysaccharide-23 10/20/2013, 06/19/2020  ? Tdap 10/20/2013  ? Zoster Recombinat (Shingrix)

## 2021-12-10 NOTE — Patient Instructions (Signed)
Randy Orr,  ?It was a pleasure speaking with you  ?Below is a summary of your health goals and care plan ? ? ?If you have any questions or concerns, please feel free to contact me either at the phone number below or with a MyChart message.  ? ?Keep up the good work! ? ?Cherre Robins, PharmD ?Clinical Pharmacist ?[facility] ?[place] ?[phone] (direct line)  ?[phone] (main office number) ? ? ?Chronic Care Management Care Plan ? ?Hyperlipidemia ?     ?Lab Results  ?Component Value Date/Time  ?  LDLCALC 147 (H) 03/12/2021 10:06 AM  ?  LDLDIRECT 146.0 07/01/2019 09:04 AM  ?  ?Pharmacist Clinical Goal(s): ?Over the next 90 days, patient will work with PharmD and providers to achieve LDL goal < 100 ?Current regimen:  ?Cholestoff - '900mg'$  of sterols ?Coenzyme Q10 daily ?Fish oil '1200mg'$  2AM, 1PM  ?Interventions: ?Discussed LDL goals and importance of reducing risk of heart attack and stroke ?Consider starting ezetimibe '10mg'$  daily if next lipid panel is greater than 100 ?Patient self care activities - Over the next 90 days, patient will: ?Maintain cholesterol medication regimen. ?  ?Diabetes ?     ?Lab Results  ?Component Value Date/Time  ?  HGBA1C 7.8 (H) 10/10/2021 09:02 AM  ?  HGBA1C 7.3 (H) 06/28/2021 09:52 AM  ?  ?Pharmacist Clinical Goal(s): ?Over the next 90 days, patient will work with PharmD and providers to maintain A1c goal <7% ?Current regimen:  ?Metformin ER '500mg'$  - take 2 tablets = '1000mg'$  daily ?Farxiga '10mg'$  - take 0.5 tablet = '5mg'$  daily  ?Interventions: ?Discussed importance of diet and exercise ?Need to update metformin script with next refill ?Patient self care activities - Over the next 90 days, patient will: ?Maintain A1c less than 7% ?Continue to limit intake of high sugar and carbohydrate foods ?Exercise regularly with goal of at least 150 minutes of physical activity per week.  ?  ?Low Testosterone:  ?Pharmacist Clinical Goal(s): ?Over the next 90 days, patient will work  with PharmD and providers to maintain testosterone level in therapeutic range and decrease fatigue and other symptoms associated with low testosterone ?Current regimen:  ?Testosterone injection - inject 0.65 mL into muscle every 10 days. ?Interventions: ?Discussed cost of testosterone therapy - injection is still least expensive compare to patches.  ?Patient self care activities - Over the next 90 days, patient will: ?            Continue current therapy for low testosterone ?  ?Medication management ?Pharmacist Clinical Goal(s): ?Over the next 90 days, patient will work with PharmD and providers to maintain optimal medication adherence ?[facility] ?Interventions ?Comprehensive medication review performed. ?Continue current medication management strategy ?Patient self care activities - Over the next 90 days, patient will: ?Focus on medication adherence by filling and taking medications appropriately  ?Take medications as prescribed ?Report any questions or concerns to PharmD and/or provider(s) ? ?Patient verbalizes understanding of instructions and care plan provided today and agrees to view in [place]. Active MyChart status confirmed with patient.    ?

## 2022-01-09 DIAGNOSIS — E1169 Type 2 diabetes mellitus with other specified complication: Secondary | ICD-10-CM

## 2022-01-09 DIAGNOSIS — Z7984 Long term (current) use of oral hypoglycemic drugs: Secondary | ICD-10-CM

## 2022-01-09 DIAGNOSIS — E785 Hyperlipidemia, unspecified: Secondary | ICD-10-CM

## 2022-02-14 DIAGNOSIS — S83231A Complex tear of medial meniscus, current injury, right knee, initial encounter: Secondary | ICD-10-CM | POA: Diagnosis not present

## 2022-02-14 DIAGNOSIS — Y999 Unspecified external cause status: Secondary | ICD-10-CM | POA: Diagnosis not present

## 2022-02-14 DIAGNOSIS — S83241A Other tear of medial meniscus, current injury, right knee, initial encounter: Secondary | ICD-10-CM | POA: Diagnosis not present

## 2022-02-14 DIAGNOSIS — X58XXXA Exposure to other specified factors, initial encounter: Secondary | ICD-10-CM | POA: Diagnosis not present

## 2022-02-14 DIAGNOSIS — M948X6 Other specified disorders of cartilage, lower leg: Secondary | ICD-10-CM | POA: Diagnosis not present

## 2022-02-14 DIAGNOSIS — M94261 Chondromalacia, right knee: Secondary | ICD-10-CM | POA: Diagnosis not present

## 2022-02-27 DIAGNOSIS — M5431 Sciatica, right side: Secondary | ICD-10-CM | POA: Diagnosis not present

## 2022-02-27 DIAGNOSIS — S83241D Other tear of medial meniscus, current injury, right knee, subsequent encounter: Secondary | ICD-10-CM | POA: Diagnosis not present

## 2022-03-08 DIAGNOSIS — S83231D Complex tear of medial meniscus, current injury, right knee, subsequent encounter: Secondary | ICD-10-CM | POA: Diagnosis not present

## 2022-03-19 DIAGNOSIS — S83231D Complex tear of medial meniscus, current injury, right knee, subsequent encounter: Secondary | ICD-10-CM | POA: Diagnosis not present

## 2022-03-27 DIAGNOSIS — M25551 Pain in right hip: Secondary | ICD-10-CM | POA: Diagnosis not present

## 2022-03-27 DIAGNOSIS — S83231D Complex tear of medial meniscus, current injury, right knee, subsequent encounter: Secondary | ICD-10-CM | POA: Diagnosis not present

## 2022-04-01 DIAGNOSIS — S83231D Complex tear of medial meniscus, current injury, right knee, subsequent encounter: Secondary | ICD-10-CM | POA: Diagnosis not present

## 2022-04-24 DIAGNOSIS — S83231D Complex tear of medial meniscus, current injury, right knee, subsequent encounter: Secondary | ICD-10-CM | POA: Diagnosis not present

## 2022-05-12 ENCOUNTER — Encounter (INDEPENDENT_AMBULATORY_CARE_PROVIDER_SITE_OTHER): Payer: Medicare HMO | Admitting: Family Medicine

## 2022-05-12 DIAGNOSIS — F5102 Adjustment insomnia: Secondary | ICD-10-CM | POA: Diagnosis not present

## 2022-05-12 DIAGNOSIS — R69 Illness, unspecified: Secondary | ICD-10-CM | POA: Diagnosis not present

## 2022-05-13 MED ORDER — TRAZODONE HCL 50 MG PO TABS: 25.0000 mg | ORAL_TABLET | Freq: Every evening | ORAL | 6 refills | Status: DC | PRN

## 2022-05-13 NOTE — Telephone Encounter (Signed)
Please see the MyChart message reply(ies) for my assessment and plan.  The patient gave consent for this Medical Advice Message and is aware that it may result in a bill to their insurance company as well as the possibility that this may result in a co-payment or deductible. They are an established patient, but are not seeking medical advice exclusively about a problem treated during an in person or video visit in the last 7 days. I did not recommend an in person or video visit within 7 days of my reply.  I spent a total of 5 minutes cumulative time within 7 days through [place] messaging Lamar Blinks, MD

## 2022-05-20 ENCOUNTER — Ambulatory Visit: Payer: Medicare HMO | Admitting: Family Medicine

## 2022-06-01 NOTE — Patient Instructions (Incomplete)
Good to see you again today I will be in touch with your labs asap Plan to see me in about 6 months assuming all is well Recommend covid booster and RSV this fall - done already!  I will be in touch with your coronary calcium as well

## 2022-06-01 NOTE — Progress Notes (Unsigned)
[place] at [facility] [address], [place], [place] [address] [phone]  Date:  06/05/2022   Name:  Randy Orr   DOB:  01-30-1951   MRN:  [id number]  PCP:  [name], MD    Chief Complaint: No chief complaint on file.   History of Present Illness:  Randy Orr is a 71 y.o. very pleasant male patient who presents with the following:  Pt seen today for a recheck visit  Last seen by myself in March - History of diabetes, peripheral neuropathy, hypogonadism, hyperlipidemia He is unable to tolerate statins  He is treated with injectable testosterone for his hypogonadism  Can offer CT coronary   Foot exam Covid booster Labs today, A1c Flu shot Can update micro albumin   Farxiga Metformin sildenafil Testosterone cypionate Trazodone  Patient Active Problem List   Diagnosis Date Noted   Acute pain of right knee 10/16/2021   Statin myopathy 09/27/2020   Left knee pain 06/06/2016   Peripheral sensory neuropathy due to type 2 diabetes mellitus ([place]) 05/17/2014   Snoring 05/17/2014   Obesity 05/17/2014   Diabetes mellitus ([place]) 09/23/2011   Hypogonadism male 09/23/2011   Hyperlipidemia 09/23/2011    Past Medical History:  Diagnosis Date   Diabetes mellitus without complication ([place])    DM type 2   GERD (gastroesophageal reflux disease)    Hyperlipidemia    Low testosterone    Obesity 05/17/2014   Peripheral sensory neuropathy due to type 2 diabetes mellitus ([place]) 05/17/2014   Snoring 05/17/2014    Past Surgical History:  Procedure Laterality Date   COLONOSCOPY  07/18/2014    Social History   Tobacco Use   Smoking status: Former   Smokeless tobacco: Never   Tobacco comments:    smoked 30 years ago.  Substance Use Topics   Alcohol use: Yes    Alcohol/week: 5.0 standard drinks of alcohol    Types: 5 Glasses of wine per week   Drug use: No    Family History  Problem Relation Age of Onset    Diabetes Brother    Heart attack Father    Colon cancer Neg Hx    Esophageal cancer Neg Hx    Rectal cancer Neg Hx    Stomach cancer Neg Hx     Allergies  Allergen Reactions   Statins Other (See Comments)    We have tried several, cannot tolerate  - myalgias    Medication list has been reviewed and updated.  Current Outpatient Medications on File Prior to Visit  Medication Sig Dispense Refill   cyclobenzaprine (FLEXERIL) 10 MG tablet Take 1 tablet (10 mg total) by mouth daily as needed for muscle spasms. 30 tablet 0   dapagliflozin propanediol (FARXIGA) 10 MG TABS tablet Take 1 tablet (10 mg total) by mouth daily. (Patient taking differently: Take 5 mg by mouth daily. Taking 1/2 tab daily. 5 mg) 30 tablet 6   l-methylfolate-B6-B12 (METANX) 3-35-2 MG TABS Take 1 tablet by mouth daily. 60 tablet 1   metFORMIN (GLUCOPHAGE-XR) 500 MG 24 hr tablet Take 2 tablets (1,000 mg total) by mouth daily with breakfast. 180 tablet 3   Multiple Vitamin (MULTIVITAMIN) tablet Take 1 tablet by mouth daily.     Needle, Disp, (HYPODERMIC NEEDLE 23GX1") 23G X 1" MISC Use to give IM injection as directed 50 each 1   Plant Sterol Stanol-Pantethine (CHOLESTOFF COMPLETE PO) Take 1 capsule by mouth daily.  sildenafil (REVATIO) 20 MG tablet USE 1-5 TABLETS BY MOUTH DAILY AS NEEDED FOR ED 50 tablet 5   testosterone cypionate (DEPOTESTOSTERONE CYPIONATE) 200 MG/ML injection INJECT 1 ML('200mg'$ ) INTRAMUSCULARLY EVERY 14 DAYS. 6 mL 1   traZODone (DESYREL) 50 MG tablet Take 0.5-1 tablets (25-50 mg total) by mouth at bedtime as needed for sleep. 30 tablet 6   No current facility-administered medications on file prior to visit.    Review of Systems:  As per HPI- otherwise negative.   Physical Examination: There were no vitals filed for this visit. There were no vitals filed for this visit. There is no height or weight on file to calculate BMI. Ideal Body Weight:    GEN: no acute distress. HEENT:  Atraumatic, Normocephalic.  Ears and Nose: No external deformity. CV: RRR, No M/G/R. No JVD. No thrill. No extra heart sounds. PULM: CTA B, no wheezes, crackles, rhonchi. No retractions. No resp. distress. No accessory muscle use. ABD: S, NT, ND, +BS. No rebound. No HSM. EXTR: No c/c/e PSYCH: Normally interactive. Conversant.    Assessment and Plan: ***  Signed Lamar Blinks, MD

## 2022-06-04 ENCOUNTER — Other Ambulatory Visit: Payer: Self-pay | Admitting: Family Medicine

## 2022-06-04 DIAGNOSIS — F5102 Adjustment insomnia: Secondary | ICD-10-CM

## 2022-06-05 ENCOUNTER — Ambulatory Visit (INDEPENDENT_AMBULATORY_CARE_PROVIDER_SITE_OTHER): Payer: Medicare HMO | Admitting: Family Medicine

## 2022-06-05 ENCOUNTER — Encounter: Payer: Self-pay | Admitting: Family Medicine

## 2022-06-05 VITALS — BP 110/64 | HR 72 | Temp 97.9°F | Resp 18 | Ht 75.0 in | Wt 222.2 lb

## 2022-06-05 DIAGNOSIS — E291 Testicular hypofunction: Secondary | ICD-10-CM | POA: Diagnosis not present

## 2022-06-05 DIAGNOSIS — Z5181 Encounter for therapeutic drug level monitoring: Secondary | ICD-10-CM | POA: Diagnosis not present

## 2022-06-05 DIAGNOSIS — E785 Hyperlipidemia, unspecified: Secondary | ICD-10-CM

## 2022-06-05 DIAGNOSIS — E119 Type 2 diabetes mellitus without complications: Secondary | ICD-10-CM | POA: Diagnosis not present

## 2022-06-05 DIAGNOSIS — Z125 Encounter for screening for malignant neoplasm of prostate: Secondary | ICD-10-CM | POA: Diagnosis not present

## 2022-06-05 LAB — CBC
HCT: 46.9 % (ref 39.0–52.0)
Hemoglobin: 16.1 g/dL (ref 13.0–17.0)
MCHC: 34.3 g/dL (ref 30.0–36.0)
MCV: 94.1 fl (ref 78.0–100.0)
Platelets: 184 10*3/uL (ref 150.0–400.0)
RBC: 4.99 Mil/uL (ref 4.22–5.81)
RDW: 13.4 % (ref 11.5–15.5)
WBC: 5.1 10*3/uL (ref 4.0–10.5)

## 2022-06-05 LAB — LIPID PANEL
Cholesterol: 225 mg/dL — ABNORMAL HIGH (ref 0–200)
HDL: 47.9 mg/dL (ref >39.00)
LDL Cholesterol: 144 mg/dL — ABNORMAL HIGH (ref 0–99)
NonHDL: 177.45
Total CHOL/HDL Ratio: 5
Triglycerides: 165 mg/dL — ABNORMAL HIGH (ref 0.0–149.0)
VLDL: 33 mg/dL (ref 0.0–40.0)

## 2022-06-05 LAB — COMPREHENSIVE METABOLIC PANEL
ALT: 36 U/L (ref 0–53)
AST: 23 U/L (ref 0–37)
Albumin: 4.5 g/dL (ref 3.5–5.2)
Alkaline Phosphatase: 56 U/L (ref 39–117)
BUN: 15 mg/dL (ref 6–23)
CO2: 30 mEq/L (ref 19–32)
Calcium: 9.3 mg/dL (ref 8.4–10.5)
Chloride: 99 mEq/L (ref 96–112)
Creatinine, Ser: 1 mg/dL (ref 0.40–1.50)
GFR: 76.97 mL/min (ref >60.00)
Glucose, Bld: 155 mg/dL — ABNORMAL HIGH (ref 70–99)
Potassium: 4.3 mEq/L (ref 3.5–5.1)
Sodium: 137 mEq/L (ref 135–145)
Total Bilirubin: 0.7 mg/dL (ref 0.2–1.2)
Total Protein: 6.8 g/dL (ref 6.0–8.3)

## 2022-06-05 LAB — MICROALBUMIN / CREATININE URINE RATIO
Creatinine,U: 79.8 mg/dL
Microalb Creat Ratio: 0.9 mg/g (ref 0.0–30.0)
Microalb, Ur: <0.7 mg/dL (ref 0.0–1.9)

## 2022-06-05 LAB — HEMOGLOBIN A1C: Hgb A1c MFr Bld: 8.4 % — ABNORMAL HIGH (ref 4.6–6.5)

## 2022-06-05 LAB — PSA: PSA: 2.76 ng/mL (ref 0.10–4.00)

## 2022-06-06 ENCOUNTER — Encounter: Payer: Self-pay | Admitting: Family Medicine

## 2022-06-06 ENCOUNTER — Ambulatory Visit (HOSPITAL_BASED_OUTPATIENT_CLINIC_OR_DEPARTMENT_OTHER)
Admission: RE | Admit: 2022-06-06 | Discharge: 2022-06-06 | Disposition: A | Discharge status: Home / Self Care | Payer: Medicare HMO | Source: Ambulatory Visit | Attending: Family Medicine | Admitting: Family Medicine

## 2022-06-06 DIAGNOSIS — E785 Hyperlipidemia, unspecified: Secondary | ICD-10-CM

## 2022-06-06 DIAGNOSIS — E119 Type 2 diabetes mellitus without complications: Secondary | ICD-10-CM

## 2022-06-06 LAB — TESTOSTERONE TOTAL,FREE,BIO, MALES
Albumin: 4.5 g/dL (ref 3.6–5.1)
Sex Hormone Binding: 28 nmol/L (ref 22–77)
Testosterone, Bioavailable: 254.7 ng/dL (ref 110.0–575.0)
Testosterone, Free: 123.9 pg/mL (ref 46.0–224.0)
Testosterone: 740 ng/dL (ref 250–827)

## 2022-06-10 ENCOUNTER — Encounter: Payer: Self-pay | Admitting: Family Medicine

## 2022-06-10 DIAGNOSIS — I712 Thoracic aortic aneurysm, without rupture, unspecified: Secondary | ICD-10-CM | POA: Insufficient documentation

## 2022-06-15 MED ORDER — ALPRAZOLAM 0.5 MG PO TABS: 0.2500 mg | ORAL_TABLET | Freq: Two times a day (BID) | ORAL | 1 refills | Status: DC | PRN

## 2022-06-28 DIAGNOSIS — E119 Type 2 diabetes mellitus without complications: Secondary | ICD-10-CM | POA: Diagnosis not present

## 2022-06-28 DIAGNOSIS — H524 Presbyopia: Secondary | ICD-10-CM | POA: Diagnosis not present

## 2022-06-28 LAB — HM DIABETES EYE EXAM

## 2022-07-01 ENCOUNTER — Encounter: Payer: Self-pay | Admitting: *Deleted

## 2022-07-10 ENCOUNTER — Encounter: Payer: Self-pay | Admitting: Family Medicine

## 2022-07-10 ENCOUNTER — Other Ambulatory Visit: Payer: Self-pay | Admitting: Family Medicine

## 2022-07-10 DIAGNOSIS — E291 Testicular hypofunction: Secondary | ICD-10-CM

## 2022-07-11 ENCOUNTER — Telehealth: Payer: Self-pay | Admitting: Family Medicine

## 2022-07-11 ENCOUNTER — Telehealth (INDEPENDENT_AMBULATORY_CARE_PROVIDER_SITE_OTHER): Payer: Medicare HMO | Admitting: Family Medicine

## 2022-07-11 ENCOUNTER — Encounter: Payer: Self-pay | Admitting: Family Medicine

## 2022-07-11 VITALS — Ht 75.0 in | Wt 219.0 lb

## 2022-07-11 DIAGNOSIS — Z0000 Encounter for general adult medical examination without abnormal findings: Secondary | ICD-10-CM

## 2022-07-11 MED ORDER — TESTOSTERONE CYPIONATE 200 MG/ML IM SOLN: INTRAMUSCULAR | 1 refills | Status: DC

## 2022-07-11 NOTE — Telephone Encounter (Signed)
Randy Orr has been filled, okay for refill on Testosterone?

## 2022-07-11 NOTE — Telephone Encounter (Signed)
Patient is out of testosterone  Medication: testosterone cypionate (DEPOTESTOSTERONE CYPIONATE) 200 MG/ML injection   dapagliflozin propanediol (FARXIGA) 10 MG TABS tablet   Has the patient contacted their pharmacy? Yes.    Preferred Pharmacy (with phone number or street name):  [id number] [place]Phone: [phone] Fax: [phone]   Agent: Please be advised that RX refills may take up to 3 business days. We ask that you follow-up with your pharmacy.

## 2022-07-11 NOTE — Patient Instructions (Addendum)
I really enjoyed getting to talk with you today! I am available on Tuesdays and Thursdays for virtual visits if you have any questions or concerns, or if I can be of any further assistance.   CHECKLIST FROM ANNUAL WELLNESS VISIT:  -Follow up (please call to schedule if not scheduled after visit):  -Inperson visit with your Primary Doctor office: every 3-4 months -yearly for annual wellness visit with primary care office  Here is a list of your preventive care/health maintenance measures and the plan for each if any are due:  Health Maintenance  Topic Date Due   Medicare Annual Wellness (AWV)  06/20/2022   COVID-19 Vaccine (7 - 2023-24 season) 06/27/2022   HEMOGLOBIN A1C  12/05/2022   Diabetic kidney evaluation - GFR measurement  06/06/2023   Diabetic kidney evaluation - Urine ACR  06/06/2023   FOOT EXAM  06/06/2023   OPHTHALMOLOGY EXAM  06/29/2023   DTaP/Tdap/Td (2 - Td or Tdap) 10/21/2023   COLONOSCOPY (Pts 45-57yr Insurance coverage will need to be confirmed)  08/19/2024   Pneumonia Vaccine 69 Years old  Completed   INFLUENZA VACCINE  Completed   Hepatitis C Screening  Completed   Zoster Vaccines- Shingrix  Completed   HPV VACCINES  Aged Out    -See a dentist at least yearly  -Other issues addressed today:  You may find this book helpful for your diabetes. It is available on [place]and is fairly inexpensive.  Would recommend that you make sure that your doctor managing your diabetes medications is aware before starting any big changes so that they can closely monitor your levels and adjust medications as needed.   Dr. NJanene HarveyProgram for Reversing Diabetes: The Scientifically Proven System for Reversing Diabetes without Drugs  http://www.nealbarnard.org/index.cfm   -I have included below further information regarding a healthy whole foods based diet, physical activity guidelines for adults, stress management and opportunities for social connections. I hope you find  this information useful.     FOOD - THE FUEL FOR A HAPPY HEALTHY LIFE: -eat real food: lots of colorful vegetables (half the plate) -reduce sodium to less than '2000mg'$  per day, The American Heart Association recommends less than '1500mg'$  per days is ideal for protecting our cardiovascular system -5-7 servings of vegetables and fruits per day (fresh or steamed is best), exp. 2 servings of vegetables with lunch and dinner and 2 servings of fruit per day. Berries and greens such as kale and collards are great choices.  -lower sugar fruits would be berries -consume on a regular basis: whole grains (make sure first ingredient on label contains the word "whole"), fresh fruits, fish, nuts, seeds, healthy oils (such as olive oil, avocado oil, grape seed oil) -may eat small amounts of dairy and lean meat on occasion, but avoid processed meats such as ham, bacon, lunch meat, etc. -drink water -try to avoid fast food and pre-packaged foods, processed meat -try to avoid foods that contain any ingredients with names you do not recognize  -try to avoid sugar/sweets (except for the natural sugar that occurs in fresh fruit) -try to avoid sweet drinks -try to avoid white rice, white bread, pasta (unless whole grain), white or yellow potatoes  MOVE - the key to keeping your body moving and working best: -if you wish to increase your physical activity, do so gradually and with the approval of your doctor -STOP and seek medical care immediately if you have any chest pain, chest discomfort or trouble breathing when starting or increasing  exercise  -move and stretch your body, legs, feet and arms when sitting for long periods -Physical activity guidelines for optimal  health in adults: -least 150 minutes per week of aerobic exercise (can talk, but not sing) once approved by your doctor, 20-30 minutes of sustained activity or two 10 minute episodes of sustained activity every day.  -resistance training at least 2 days per week if approved by your doctor -balance exercises 3+ days per week:   Stand somewhere where you have something sturdy to hold onto if you lose balance.    1) lift up on toes, start with 5x per day and work up to 20x   2) stand and lift on leg straight out to the side so that foot is a few inches of the floor, start with 5x each side and work up to 20x each side   3) stand on one foot, start with 5 seconds each side and work up to 20 seconds on each side  If you need ideas or help with getting more active:  -Silver sneakers https://tools.silversneakers.com  -Walk with a Doc: http://stephens-thompson.biz/  -try to include resistance (weight lifting/strength building) and balance exercises twice per week: or the following link for ideas: ChessContest.fr  UpdateClothing.com.cy  STRESS MANAGEMENT - so important for health and well being -try meditating, or just sitting quietly with deep breathing while intentionally relaxing all parts of your body for 5 minutes daily  SOCIAL CONNECTIONS: -options in [place] if you wish to engage in more social and exercise related activities:  -Silver sneakers https://tools.silversneakers.com  -Walk with a Doc: http://stephens-thompson.biz/  -Check out the [place] 50+ section on the [place] of [employer] (hiking clubs, book clubs, cards and games, chess, exercise classes, aquatic classes and much more) - see the website for details: https://www.[place].gov/departments/parks-recreation/active-adults50  -YouTube has lots of exercise videos for different ages and abilities as  well  -[place] (a variety of indoor and outdoor inperson activities for adults). [phone]. [address].  -Virtual Online Classes (a variety of topics): see seniorplanet.org or call [phone]  -consider volunteering at a school, hospice center, church, senior center or elsewhere    ADVANCED HEALTHCARE DIRECTIVES:  Everyone should have advanced health care directives in place. This is so that you get the care you want, should you ever be in a situation where you are unable to make your own medical decisions.   From the  Advanced Directive Website: "[place] are legal documents in which you give written instructions about your health care if, in the future, you cannot speak for yourself.   A health care power of attorney allows you to name a person you trust to make your health care decisions if you cannot make them yourself. A declaration of a desire for a natural death (or living will) is document, which states that you desire not to have your life prolonged by extraordinary measures if you have a terminal or incurable illness or if you are in a vegetative state. An advance instruction for mental health treatment makes a declaration of instructions, information and preferences regarding your mental health treatment. It also states that you are aware that the advance instruction authorizes a mental health treatment provider to act according to your wishes. It may also outline your consent or refusal of mental health treatment. A declaration of an anatomical gift allows anyone over the age of 42 to make a gift by will, organ donor card or other document."  Please see the following website or an elder law attorney for forms, FAQs and for completion of advanced directives: [place] Secretary of [place] ([email or web address])  Or copy and paste the following to your web  browser: [email or web address].cy

## 2022-07-11 NOTE — Progress Notes (Signed)
PATIENT CHECK-IN and HEALTH RISK ASSESSMENT QUESTIONNAIRE:  -completed by phone/video for upcoming Medicare Preventive Visit  Pre-Visit Check-in: 1)Vitals (height, wt, BP, etc) - record in vitals section for visit on day of visit 2)Review and Update Medications, Allergies PMH, Surgeries, Social history in Epic 3)Hospitalizations in the last year with date/reason? Yes knee surgery in July - reports is doing well and healed well 4)Review and Update Care Team (patient's specialists) in Epic 5) Complete PHQ9 in Epic  6) Complete Fall Screening in Epic 7)Review all Health Maintenance Due and order under PCP if not done.  Medicare Wellness Patient Questionnaire:  Answer theses question about your habits: Do you drink alcohol? yes If yes, how many drinks do you have a day? 1 a day - beer (sometimes 2 normal cans), red wine at times - rarely exceeds this Have you ever smoked?yes Quit date if applicable? In his 20's  How many packs a day do/did you smoke? Less than 1 Do you use smokeless tobacco?no Do you use an illicit drugs?no Do you exercises? Yes IF so, what type and how many days/minutes per week?1 1/2 hours a week (has weights, bench and rowing machine in home gym) and walks at least 45 Are you sexually active? Yes Number of partners? 1 Typical breakfast eggs, English muffin, Almond milk Typical lunch [place] sandwich multi grain bread Typical dinner black beans with chicken breast with veggies (a few servings with dinner) Typical snacks: unsalted nuts  Beverages: unsweetened almond milk   Answer theses question about you: Can you perform most household chores?yes Do you find it hard to follow a conversation in a noisy room?yes patient has hearing aids Do you often ask people to speak up or repeat themselves?yes Do you feel that you have a problem with memory? Yes and no Do you balance your checkbook and or bank acounts?no wife is a Clinical cytogeneticist Do you feel safe at home? yes Last  dentist visit? 05/2022 Do you need assistance with any of the following: Please note if so no  Driving?  Feeding yourself?  Getting from bed to chair?  Getting to the toilet?  Bathing or showering?  Dressing yourself?  Managing money?  Climbing a flight of stairs  Preparing meals?    Do you have Advanced Directives in place (Living Will, Healthcare Power or Attorney)? yes   Last eye Exam and location?[place] Done 06/2022   Do you currently use prescribed or non-prescribed narcotic or opioid pain medications? No flexeril as needed  Do you have a history or close family history of breast, ovarian, tubal or peritoneal cancer or a family member with BRCA (breast cancer susceptibility 1 and 2) gene mutations? no  Nurse/Assistant Credentials/time stamp:  Westley Hummer CMA ----------------------------------------------------------------------------------------------------------------------------------------------------------------------------------------------------------------------    [place] VISIT WITH PROVIDER (Welcome to Medicare, initial annual wellness or annual wellness exam)  Virtual Visit via Video Note  I connected with Randy Orr  on 07/11/2022 by a video enabled telemedicine application and verified that I am speaking with the correct person using two identifiers.  Location patient: home Location provider:work or home office Persons participating in the virtual visit: patient, provider  Concerns and/or follow up today: none   See HM section in Epic for other details of completed HM.    ROS: negative for report of fevers, unintentional weight loss, vision changes, vision loss, hearing loss or change, chest pain, sob, hemoptysis, melena, hematochezia, hematuria, genital discharge or lesions, falls, bleeding or bruising, loc, thoughts of suicide or self harm,  memory loss  Patient-completed extensive health risk assessment - reviewed and discussed with  the patient: See Health Risk Assessment completed with patient prior to the visit either above or in recent phone note. This was reviewed in detailed with the patient today and appropriate recommendations, orders and referrals were placed as needed per Summary below and patient instructions.   Review of Medical History: -PMH, PSH, Family History and current specialty and care providers reviewed and updated and listed below   Patient Care Team: Copland, Gay Filler, MD as PCP - General (Family Medicine) Cherre Robins, RPH-CPP (Pharmacist)   Past Medical History:  Diagnosis Date   Diabetes mellitus without complication ([place])    DM type 2   GERD (gastroesophageal reflux disease)    Hyperlipidemia    Low testosterone    Obesity 05/17/2014   Peripheral sensory neuropathy due to type 2 diabetes mellitus ([place]) 05/17/2014   Snoring 05/17/2014    Past Surgical History:  Procedure Laterality Date   COLONOSCOPY  07/18/2014    Social History   Socioeconomic History   Marital status: Married    Spouse name: Not on file   Number of children: 1   Years of education: AAS   Highest education level: Not on file  Occupational History   Occupation: SECURITY OFFICER    Employer: [employer]  Tobacco Use   Smoking status: Former   Smokeless tobacco: Never   Tobacco comments:    smoked 30 years ago.  Substance and Sexual Activity   Alcohol use: Yes    Alcohol/week: 5.0 standard drinks of alcohol    Types: 5 Glasses of wine per week   Drug use: No   Sexual activity: Not on file  Other Topics Concern   Not on file  Social History Narrative   Not on file   Social Determinants of Health   Financial Resource Strain: Low Risk  (08/30/2021)   Overall Financial Resource Strain (CARDIA)    Difficulty of Paying Living Expenses: Not very hard  Food Insecurity: No Food Insecurity (06/20/2021)   Hunger Vital Sign    Worried About Running Out of Food in the Last Year: Never true    Ran Out of  Food in the Last Year: Never true  Transportation Needs: No Transportation Needs (06/20/2021)   PRAPARE - Hydrologist (Medical): No    Lack of Transportation (Non-Medical): No  Physical Activity: Inactive (06/20/2021)   Exercise Vital Sign    Days of Exercise per Week: 0 days    Minutes of Exercise per Session: 0 min  Stress: Stress Concern Present (06/20/2021)   [place]    Feeling of Stress : To some extent  Social Connections: Moderately Integrated (06/20/2021)   Social Connection and Isolation Panel [NHANES]    Frequency of Communication with Friends and Family: More than three times a week    Frequency of Social Gatherings with Friends and Family: Once a week    Attends Religious Services: Never    Marine scientist or Organizations: Yes    Attends Music therapist: More than 4 times per year    Marital Status: Married  Human resources officer Violence: Not At Risk (06/20/2021)   Humiliation, Afraid, Rape, and Kick questionnaire    Fear of Current or Ex-Partner: No    Emotionally Abused: No    Physically Abused: No    Sexually Abused: No    Family History  Problem Relation Age of Onset   Diabetes Brother    Heart attack Father    Colon cancer Neg Hx    Esophageal cancer Neg Hx    Rectal cancer Neg Hx    Stomach cancer Neg Hx     Current Outpatient Medications on File Prior to Visit  Medication Sig Dispense Refill   ALPRAZolam (XANAX) 0.5 MG tablet Take 0.5-1 tablets (0.25-0.5 mg total) by mouth 2 (two) times daily as needed for anxiety. 20 tablet 1   cyclobenzaprine (FLEXERIL) 10 MG tablet Take 1 tablet (10 mg total) by mouth daily as needed for muscle spasms. 30 tablet 0   dapagliflozin propanediol (FARXIGA) 10 MG TABS tablet TAKE 1 TABLET BY MOUTH EVERY DAY 30 tablet 6   l-methylfolate-B6-B12 (METANX) 3-35-2 MG TABS Take 1 tablet by mouth daily. 60 tablet 1    metFORMIN (GLUCOPHAGE-XR) 500 MG 24 hr tablet Take 2 tablets (1,000 mg total) by mouth daily with breakfast. 180 tablet 3   Multiple Vitamin (MULTIVITAMIN) tablet Take 1 tablet by mouth daily.     Needle, Disp, (HYPODERMIC NEEDLE 23GX1") 23G X 1" MISC Use to give IM injection as directed 50 each 1   Plant Sterol Stanol-Pantethine (CHOLESTOFF COMPLETE PO) Take 1 capsule by mouth daily.     sildenafil (REVATIO) 20 MG tablet USE 1-5 TABLETS BY MOUTH DAILY AS NEEDED FOR ED 50 tablet 5   testosterone cypionate (DEPOTESTOSTERONE CYPIONATE) 200 MG/ML injection INJECT 1 ML(200MG) INTRAMUSCULARLY EVERY 14 DAYS. 6 mL 0   testosterone cypionate (DEPOTESTOSTERONE CYPIONATE) 200 MG/ML injection INJECT 1 ML(291m) INTRAMUSCULARLY EVERY 14 DAYS. 6 mL 1   traZODone (DESYREL) 50 MG tablet TAKE 0.5-1 TABLETS BY MOUTH AT BEDTIME AS NEEDED FOR SLEEP. 90 tablet 1   No current facility-administered medications on file prior to visit.    Allergies  Allergen Reactions   Statins Other (See Comments)    We have tried several, cannot tolerate  - myalgias       Physical Exam There were no vitals filed for this visit. Estimated body mass index is 27.37 kg/m as calculated from the following:   Height as of this encounter: _0  (1.905 m).   Weight as of this encounter: 219 lb (99.3 kg).  EKG (optional): deferred due to virtual visit  GENERAL: alert, oriented, appears well and in no acute distress; visual acuity grossly intact, full vision exam deferred due to pandemic and/or virtual encounter  HEENT: atraumatic, conjunttiva clear, no obvious abnormalities on inspection of external nose and ears  NECK: normal movements of the head and neck  LUNGS: on inspection no signs of respiratory distress, breathing rate appears normal, no obvious gross SOB, gasping or wheezing  CV: no obvious cyanosis  MS: moves all visible extremities without noticeable abnormality  PSYCH/NEURO: pleasant and cooperative, no obvious  depression or anxiety, speech and thought processing grossly intact, Cognitive function grossly intact  Flowsheet Row Video Visit from 07/11/2022 in [facility] PHQ-9 Total Score 1           07/11/2022   11:16 AM 06/20/2021    9:18 AM 06/01/2020    9:39 AM 03/01/2015    8:15 AM  Depression screen PHQ 2/9  Decreased Interest 0 0 0 0  Down, Depressed, Hopeless 0 0 0 0  PHQ - 2 Score 0 0 0 0  Altered sleeping 0     Tired, decreased energy 0     Change in appetite 0  Feeling bad or failure about yourself  0     Trouble concentrating 1     Moving slowly or fidgety/restless 0     Suicidal thoughts 0     PHQ-9 Score 1     Difficult doing work/chores Not difficult at all          06/20/2021    9:13 AM 07/07/2022   11:30 AM 07/11/2022   11:16 AM  Fall Risk  Falls in the past year? 0 0 0  Was there an injury with Fall? 0 0 0  Fall Risk Category Calculator 0 0 0  Fall Risk Category Low Low Low  Patient Fall Risk Level Low fall risk  Low fall risk  Patient at Risk for Falls Due to   No Fall Risks  Fall risk Follow up Falls prevention discussed  Falls evaluation completed     SUMMARY AND PLAN:  Medicare annual wellness visit, subsequent    Discussed applicable health maintenance/preventive health measures and advised and referred or ordered per patient preferences:  Health Maintenance  Topic Date Due   COVID-19 Vaccine (7 - 2023-24 season) 07/27/2022 (Originally 06/27/2022)   HEMOGLOBIN A1C  12/05/2022   Diabetic kidney evaluation - GFR measurement  06/06/2023   Diabetic kidney evaluation - Urine ACR  06/06/2023   FOOT EXAM  06/06/2023   OPHTHALMOLOGY EXAM  06/29/2023   Medicare Annual Wellness (AWV)  07/12/2023   DTaP/Tdap/Td (2 - Td or Tdap) 10/21/2023   COLONOSCOPY (Pts 45-62yr Insurance coverage will need to be confirmed)  08/19/2024   Pneumonia Vaccine 71 Years old  Completed   INFLUENZA VACCINE  Completed   Hepatitis C Screening   Completed   Zoster Vaccines- Shingrix  Completed   HPV VACCINES  Aged Out    Prostate cancer screening with PSA if applicable - done with PCP  Education and counseling on the following was provided based on the above review of health and a plan/checklist for the patient, along with additional information discussed, was provided for the patient in the patient instructions :  -Advised and counseled on  healthy lifestyle - including the importance of a healthy diet, regular physical activity, social connections and stress management. -Advised and counseled on a whole foods based healthy diet and regular exercise: discussed a heart healthy whole foods based diet at length. A summary of a healthy diet was provided in the -Recommended regular exercise and discussed options within the community.  -Advise yearly dental visits at minimum and regular eye exams -Advised and counseled on alcohol limits/risks  Follow up: see patient instructions   Patient Instructions  I really enjoyed getting to talk with you today! I am available on Tuesdays and Thursdays for virtual visits if you have any questions or concerns, or if I can be of any further assistance.   CHECKLIST FROM ANNUAL WELLNESS VISIT:  -Follow up (please call to schedule if not scheduled after visit):  -Inperson visit with your Primary Doctor office: every 3-4 months -yearly for annual wellness visit with primary care office  Here is a list of your preventive care/health maintenance measures and the plan for each if any are due:  Health Maintenance  Topic Date Due   Medicare Annual Wellness (AWV)  06/20/2022   COVID-19 Vaccine (7 - 2023-24 season) 06/27/2022   HEMOGLOBIN A1C  12/05/2022   Diabetic kidney evaluation - GFR measurement  06/06/2023   Diabetic kidney evaluation - Urine ACR  06/06/2023   FOOT EXAM  06/06/2023  OPHTHALMOLOGY EXAM  06/29/2023   DTaP/Tdap/Td (2 - Td or Tdap) 10/21/2023   COLONOSCOPY (Pts 45-27yr Insurance  coverage will need to be confirmed)  08/19/2024   Pneumonia Vaccine 71 Years old  Completed   INFLUENZA VACCINE  Completed   Hepatitis C Screening  Completed   Zoster Vaccines- Shingrix  Completed   HPV VACCINES  Aged Out    -See a dentist at least yearly  -Other issues addressed today:  You may find this book helpful for your diabetes. It is available on [place]and is fairly inexpensive.  Would recommend that you make sure that your doctor managing your diabetes medications is aware before starting any big changes so that they can closely monitor your levels and adjust medications as needed.   Dr. NJanene HarveyProgram for Reversing Diabetes: The Scientifically Proven System for Reversing Diabetes without Drugs  http://www.nealbarnard.org/index.cfm   -I have included below further information regarding a healthy whole foods based diet, physical activity guidelines for adults, stress management and opportunities for social connections. I hope you find this information useful.   -----------------------------------------------------------------------------------------------------------------------------------------------------------------------------------------------------------------------------------------------------------  FOOD - THE FUEL FOR A HAPPY HEALTHY LIFE: -eat real food: lots of colorful vegetables (half the plate) -reduce sodium to less than 20046mper day, The American Heart Association recommends less than 15003mer days is ideal for protecting our cardiovascular system -5-7 servings of vegetables and fruits per day (fresh or steamed is best), exp. 2 servings of vegetables with lunch and dinner and 2 servings of fruit per day. Berries and greens such as kale and collards are great choices.  -lower sugar fruits would be berries -consume on a regular basis: whole grains (make sure first ingredient on label contains the word "whole"), fresh fruits, fish, nuts, seeds,  healthy oils (such as olive oil, avocado oil, grape seed oil) -may eat small amounts of dairy and lean meat on occasion, but avoid processed meats such as ham, bacon, lunch meat, etc. -drink water -try to avoid fast food and pre-packaged foods, processed meat -try to avoid foods that contain any ingredients with names you do not recognize  -try to avoid sugar/sweets (except for the natural sugar that occurs in fresh fruit) -try to avoid sweet drinks -try to avoid white rice, white bread, pasta (unless whole grain), white or yellow potatoes  MOVE - the key to keeping your body moving and working best: -if you wish to increase your physical activity, do so gradually and with the approval of your doctor -STOP and seek medical care immediately if you have any chest pain, chest discomfort or trouble breathing when starting or increasing exercise  -move and stretch your body, legs, feet and arms when sitting for long periods -Physical activity guidelines for optimal health in adults: -least 150 minutes per week of aerobic exercise (can talk, but not sing) once approved by your doctor, 20-30 minutes of sustained activity or two 10 minute episodes of sustained activity every day.  -resistance training at least 2 days per week if approved by your doctor -balance exercises 3+ days per week:   Stand somewhere where you have something sturdy to hold onto if you lose balance.    1) lift up on toes, start with 5x per day and work up to 20x   2) stand and lift on leg straight out to the side so that foot is a few inches of the floor, start with 5x each side and work up to 20x each side   3) stand on  one foot, start with 5 seconds each side and work up to 20 seconds on each side  If you need ideas or help with getting more active:  -Silver sneakers https://tools.silversneakers.com  -Walk with a Doc: http://stephens-thompson.biz/  -try to include resistance (weight lifting/strength building) and balance  exercises twice per week: or the following link for ideas: ChessContest.fr  UpdateClothing.com.cy  STRESS MANAGEMENT - so important for health and well being -try meditating, or just sitting quietly with deep breathing while intentionally relaxing all parts of your body for 5 minutes daily  SOCIAL CONNECTIONS: -options in [place] if you wish to engage in more social and exercise related activities:  -Silver sneakers https://tools.silversneakers.com  -Walk with a Doc: http://stephens-thompson.biz/  -Check out the [place] 50+ section on the [place] [employer] (hiking clubs, book clubs, cards and games, chess, exercise classes, aquatic classes and much more) - see the website for details: https://www.[place].gov/departments/parks-recreation/active-adults50  -YouTube has lots of exercise videos for different ages and abilities as well  -[place] (a variety of indoor and outdoor inperson activities for adults). [phone]) [phone]. [address]  -Virtual Online Classes (a variety of topics): see seniorplanet.org or call [phone]  -consider volunteering at a school, hospice center, church, senior center or elsewhere    ADVANCED HEALTHCARE DIRECTIVES:  Everyone should have advanced health care directives in place. This is so that you get the care you want, should you ever be in a situation where you are unable to make your own medical decisions.   From the [place] Advanced Directive Website: "[place] are legal documents in which you give written instructions about your health care if, in the future, you cannot speak for yourself.   A health care power of attorney allows you to name a person you trust to make your health care decisions if you cannot make them yourself. A declaration of a desire for a natural death (or living  will) is document, which states that you desire not to have your life prolonged by extraordinary measures if you have a terminal or incurable illness or if you are in a vegetative state. An advance instruction for mental health treatment makes a declaration of instructions, information and preferences regarding your mental health treatment. It also states that you are aware that the advance instruction authorizes a mental health treatment provider to act according to your wishes. It may also outline your consent or refusal of mental health treatment. A declaration of an anatomical gift allows anyone over the age of 28 to make a gift by will, organ donor card or other document."   Please see the following website or an elder law attorney for forms, FAQs and for completion of advanced directives: [place] Secretary of [place] ([email or web address].no)  Or copy and paste the following to your web browser: [email or web address]         Lucretia Kern, DO

## 2022-07-12 ENCOUNTER — Encounter: Payer: Self-pay | Admitting: Cardiology

## 2022-07-12 ENCOUNTER — Ambulatory Visit: Payer: Medicare HMO | Admitting: Pharmacist

## 2022-07-12 DIAGNOSIS — E785 Hyperlipidemia, unspecified: Secondary | ICD-10-CM

## 2022-07-12 DIAGNOSIS — Z79899 Other long term (current) drug therapy: Secondary | ICD-10-CM

## 2022-07-12 DIAGNOSIS — T466X5A Adverse effect of antihyperlipidemic and antiarteriosclerotic drugs, initial encounter: Secondary | ICD-10-CM

## 2022-07-12 DIAGNOSIS — E119 Type 2 diabetes mellitus without complications: Secondary | ICD-10-CM

## 2022-07-12 MED ORDER — EZETIMIBE 10 MG PO TABS: 10.0000 mg | ORAL_TABLET | Freq: Every day | ORAL | 5 refills | Status: DC

## 2022-07-12 NOTE — Telephone Encounter (Signed)
error 

## 2022-07-12 NOTE — Patient Instructions (Signed)
[name] It was a pleasure speaking with you today.  Below is a summary of your health goals and summary of our recent visit. Y  Start ezetimbe '10mg'$  daily for cholesterol.   I am checking on coronary calcium results - I hope to have information next week.    Continue to take [place] '10mg'$  daily and metformin Er '500mg'$  - take 2 tablets once a day with food.   Continue with plan to start Randy Orr program for improving diabetes.   As always if you have any questions or concerns especially regarding medications, please feel free to contact me either at the phone number below or with a MyChart message.   Keep up the good work!  Randy Orr, PharmD Clinical Pharmacist [facility] [phone] (direct line)  [phone] (main office number)

## 2022-07-12 NOTE — Progress Notes (Signed)
Pharmacy Note  07/12/2022 Name: Randy Orr MRN: [id number] DOB: [birth year]  Subjective: Randy Orr is a 71 y.o. year old male who is a primary care patient of Copland, Gay Filler, MD. Clinical Pharmacist Practitioner referral was placed to assist with medication management, diabetes and hyperlipidemia.    Engaged with patient by telephone for follow up visit today.   Medication management:  Reviewed meds and updated med list. Patient reports he is getting sildenafil '50mg'$  from internet program. No longer taking sildenafil '20mg'$  as needed.  Patient reports [facility] cost increased the last time he filled from $47 to $34 - likely in Medicare Coverage gap.  Reports all other medications are $10 or less per month.   Type 2 DM: Patient is tolerating increase in [place] to '10mg'$  daily. He is planning to start Dr Janene Harvey program / book to reverse type 2 DM. Patient has considered GLP1 due ultimately decided to not start due to potential side effects and cost.   Hyperlipidemia:  Intolerant to several statins. Even tried to take CoEnzyme Q10 to see if helped with muscle pain but did not help.  He has taken rosuvastatin '5mg'$  daily and every 3 days - had leg cramps, myalgias and felt horrible. (07/04/2019 to 11/04/2019) Tried pravastatin '40mg'$  09/23/2011 to 09/02/2012 - stopped due to leg cramps / myalgias.  Tried atorvastatin - dose unknown - stopped due to myopathy.  Last LDL was above goal. He is taking Fish Oil and Cholestoff.  Patient had CT of chest 06/06/2022 which showed aneurysm. Was suppose to also have coronary calcium checked but I cannot see reading for coronary calcium score.     SDOH (Social Determinants of Health) assessments and interventions performed:  SDOH Interventions    Flowsheet Row Video Visit from 07/11/2022 in [place] [facility] [place] [facility] from 08/30/2021 in [facility] [place] from  06/20/2021 in [place] [facility] [place] [facility] from 02/27/2021 in [place] [facility] [place] [facility]ventions      Depression Interventions/Treatment  PHQ2-9 Score <4 Follow-up Not Indicated -- -- --  Financial Strain Interventions -- Other (Comment)  [performed forumlary review for testostone options since his current testsonsterone injection is not being covered at pharmacy] -- Intervention Not Indicated  Physical Activity Interventions -- -- Intervention Not Indicated  [pt has set a goal to increase activity] Intervention Not Indicated        Objective: Review of patient status, including review of consultants reports, laboratory and other test data, was performed as part of comprehensive.  Lab Results  Component Value Date   CREATININE 1.00 06/05/2022   CREATININE 1.14 03/12/2021   CREATININE 1.16 06/19/2020    Lab Results  Component Value Date   HGBA1C 8.4 (H) 06/05/2022       Component Value Date/Time   CHOL 225 (H) 06/05/2022 0902   TRIG 165.0 (H) 06/05/2022 0902   HDL 47.90 06/05/2022 0902   CHOLHDL 5 06/05/2022 0902   VLDL 33.0 06/05/2022 0902   LDLCALC 144 (H) 06/05/2022 0902   LDLDIRECT 146.0 07/01/2019 0904     Clinical ASCVD: No  but coronary calcium score pending The 10-year ASCVD risk score (Arnett DK, et al., 2019) is: 25.9%   Values used to calculate the score:     Age: 34 years     Sex: Male     Is Non-Hispanic African American: No  Diabetic: Yes     Tobacco smoker: No     Systolic Blood Pressure: 088 mmHg     Is BP treated: No     HDL Cholesterol: 47.9 mg/dL     Total Cholesterol: 225 mg/dL    BP Readings from Last 3 Encounters:  06/05/22 110/64  10/10/21 132/82  06/28/21 (!) 142/80     Allergies  Allergen Reactions   Statins Other (See Comments)    We have tried several, cannot tolerate  - myalgias    Medications Reviewed Today     Reviewed by Cherre Robins,  RPH-CPP (Pharmacist) on 07/12/22 at 1338  Med List Status: <None>   Medication Order Taking? Sig Documenting Provider Last Dose Status Informant  ALPRAZolam (XANAX) 0.5 MG tablet [id number] Yes Take 0.5-1 tablets (0.25-0.5 mg total) by mouth 2 (two) times daily as needed for anxiety. Copland, Gay Filler, MD Taking Active   cyclobenzaprine (FLEXERIL) 10 MG tablet [id number] Yes Take 1 tablet (10 mg total) by mouth daily as needed for muscle spasms. Copland, Gay Filler, MD Taking Active   dapagliflozin propanediol (FARXIGA) 10 MG TABS tablet [id number] Yes TAKE 1 TABLET BY MOUTH EVERY DAY Copland, Gay Filler, MD Taking Active   l-methylfolate-B6-B12 (METANX) 3-35-2 MG TABS [id number] Yes Take 1 tablet by mouth daily. Dohmeier, Asencion Partridge, MD Taking Active            Med Note Quinn Axe Nov 01, 2019 11:53 AM) Emilio Math  metFORMIN (GLUCOPHAGE-XR) 500 MG 24 hr tablet [id number] Yes Take 2 tablets (1,000 mg total) by mouth daily with breakfast. Copland, Gay Filler, MD Taking Active   Multiple Vitamin (MULTIVITAMIN) tablet [id number] Yes Take 1 tablet by mouth daily. [provider] Taking Active            Med Note Quinn Axe Nov 01, 2019 11:55 AM) Janalee Dane Silver  Multiple Vitamins-Minerals (PRESERVISION AREDS 2 PO) [id number] Yes Take 1 tablet by mouth in the morning and at bedtime. [provider] Taking Active   Needle, Disp, (HYPODERMIC NEEDLE 23GX1") 23G X 1" MISC [id number] Yes Use to give IM injection as directed Copland, Gay Filler, MD Taking Active   Omega-3 Fatty Acids (FISH OIL BURP-LESS PO) [id number] Yes Take 1 capsule by mouth in the morning and at bedtime. [provider] Taking Active   Plant Sterol Stanol-Pantethine (CHOLESTOFF COMPLETE PO) [id number] Yes Take 1 capsule by mouth daily. [provider] Taking Active   sildenafil (VIAGRA) 50 MG tablet [id number] Yes Take 50 mg by mouth as needed for erectile dysfunction. [provider] Taking Active    testosterone cypionate (DEPOTESTOSTERONE CYPIONATE) 200 MG/ML injection [id number] Yes INJECT 1 ML('200mg'$ ) INTRAMUSCULARLY EVERY 14 DAYS. Copland, Gay Filler, MD Taking Active   traZODone (DESYREL) 50 MG tablet [id number] Yes TAKE 0.5-1 TABLETS BY MOUTH AT BEDTIME AS NEEDED FOR SLEEP. Copland, Gay Filler, MD Taking Active             Patient Active Problem List   Diagnosis Date Noted   Thoracic aortic aneurysm ([place]) 06/10/2022   Acute pain of right knee 10/16/2021   Statin myopathy 09/27/2020   Left knee pain 06/06/2016   Peripheral sensory neuropathy due to type 2 diabetes mellitus ([place]) 05/17/2014   Snoring 05/17/2014   Obesity 05/17/2014   Diabetes mellitus ([place]) 09/23/2011   Hypogonadism male 09/23/2011   Hyperlipidemia 09/23/2011     Medication Assistance:   Screened for patient assistance program for [place] or Ozempic. Household income did  not qualify for 2023 for either Farxiga or Ozempic medication assistance program.    Assessment / Plan: Type 2 DM: uncontrolled Continue Farxiga to '10mg'$  daily.  Encouraged plant based diet and increase exercise.  Per Dr Julianne Rice recommendations try Dr Janene Harvey program / book to reverse type 2 DM.  Can consider GLP1 or Mounjaro if A1c not at goal when next checked.   Hyperlipidemia: Not at goal of < 100. Intolerant to several statins.  Start ezetimibe '10mg'$  daily.  If LDL remains above goal then consider Repatha or Praulent.   Will check into results of coronary calcium reading form 06/06/2022. (If CAC is high for age, race, sex - then might need to lower LDL goal to < 70 or 55)   Medication management:  Reviewed and updated medication list Reviewed refill history and adherence  Follow Up:  Telephone follow up appointment with care management team member scheduled for:  3 to 6 months.   Cherre Robins, PharmD Clinical Pharmacist [place] [phone]

## 2022-07-13 ENCOUNTER — Encounter: Payer: Self-pay | Admitting: Family Medicine

## 2022-07-14 ENCOUNTER — Other Ambulatory Visit: Payer: Self-pay | Admitting: Family Medicine

## 2022-07-14 DIAGNOSIS — R931 Abnormal findings on diagnostic imaging of heart and coronary circulation: Secondary | ICD-10-CM

## 2022-07-17 DIAGNOSIS — Z0100 Encounter for examination of eyes and vision without abnormal findings: Secondary | ICD-10-CM | POA: Diagnosis not present

## 2022-08-02 ENCOUNTER — Ambulatory Visit (HOSPITAL_BASED_OUTPATIENT_CLINIC_OR_DEPARTMENT_OTHER): Payer: Medicare HMO | Admitting: Cardiology

## 2022-08-02 ENCOUNTER — Encounter (HOSPITAL_BASED_OUTPATIENT_CLINIC_OR_DEPARTMENT_OTHER): Payer: Self-pay | Admitting: Cardiology

## 2022-08-02 VITALS — BP 120/80 | HR 82 | Ht 75.0 in | Wt 220.2 lb

## 2022-08-02 DIAGNOSIS — I7121 Aneurysm of the ascending aorta, without rupture: Secondary | ICD-10-CM

## 2022-08-02 DIAGNOSIS — Z01812 Encounter for preprocedural laboratory examination: Secondary | ICD-10-CM

## 2022-08-02 DIAGNOSIS — Z7182 Exercise counseling: Secondary | ICD-10-CM

## 2022-08-02 DIAGNOSIS — Z7189 Other specified counseling: Secondary | ICD-10-CM

## 2022-08-02 DIAGNOSIS — E782 Mixed hyperlipidemia: Secondary | ICD-10-CM

## 2022-08-02 DIAGNOSIS — E119 Type 2 diabetes mellitus without complications: Secondary | ICD-10-CM | POA: Diagnosis not present

## 2022-08-02 DIAGNOSIS — Z712 Person consulting for explanation of examination or test findings: Secondary | ICD-10-CM | POA: Diagnosis not present

## 2022-08-02 DIAGNOSIS — T466X5A Adverse effect of antihyperlipidemic and antiarteriosclerotic drugs, initial encounter: Secondary | ICD-10-CM

## 2022-08-02 DIAGNOSIS — I2510 Atherosclerotic heart disease of native coronary artery without angina pectoris: Secondary | ICD-10-CM

## 2022-08-02 DIAGNOSIS — Z713 Dietary counseling and surveillance: Secondary | ICD-10-CM | POA: Diagnosis not present

## 2022-08-02 DIAGNOSIS — G720 Drug-induced myopathy: Secondary | ICD-10-CM

## 2022-08-02 NOTE — Progress Notes (Signed)
Cardiology Office Note:    Date:  08/02/2022   ID:  Michae Orr, DOB [birth year], MRN [id number]  PCP:  [name], MD   [place] Providers Cardiologist:  Buford Dresser, MD     Referring MD: [name], MD   CC: newly diagnosed CAD, HLD with statin intolerance  History of Present Illness:    Randy Orr is a 71 y.o. male with a hx of HLD, DM, and statin intolerance presenting to clinic today for a new consult regarding an elevated calcium score. Pt referred by his PCP [name] for discussion of statin lowering therapy. He is currently taking Zetia '10mg'$  daily. Notes from referring MD reviewed.  He had a calcium scoring CT on 06/06/22 which showed dilated ascending aorta measuring 52m and a total sore of 206.  We thoroughly discussed his CT calcium score results today along with his risk factor profile and management. He has tried Atorvastatin, Pravastatin, and Rosuvastatin with significant intolerance secondary to severe myalgias/arthralgias.  The patient denies chest pain, chest pressure, dyspnea at rest or with exertion, PND, orthopnea, or leg swelling. Denies cough, fever, chills, nausea, or vomiting. Denies any blood in his stools or hematuria. Denies syncope, presyncope, dizziness, or lightheadedness. He denies any rashes or unexpected skin changes- he notes some persistent facial irritation which started after using a new formulation of his shaving cream.  His father passed away at 656y.o. from a MI and his brother has A-fib.  He states that he has been dealing with severe right knee pain since earlier this year when he felt a painful pop while turning to open a door. He is followed by ortho for this and has received a cortisone injection with only a week of pain relief. He had an MRI right knee in March 2023 which showed a medial meniscus tear. He is now followed by Dr. [name] ortho care.  Past Medical History:   Diagnosis Date   Diabetes mellitus without complication [place]    DM type 2   GERD (gastroesophageal reflux disease)    Hyperlipidemia    Low testosterone    Obesity 05/17/2014   Peripheral sensory neuropathy due to type 2 diabetes mellitus ([place] 05/17/2014   Snoring 05/17/2014    Past Surgical History:  Procedure Laterality Date   COLONOSCOPY  07/18/2014    Current Medications: Current Meds  Medication Sig   ALPRAZolam (XANAX) 0.5 MG tablet Take 0.5-1 tablets (0.25-0.5 mg total) by mouth 2 (two) times daily as needed for anxiety.   cyclobenzaprine (FLEXERIL) 10 MG tablet Take 1 tablet (10 mg total) by mouth daily as needed for muscle spasms.   dapagliflozin propanediol (FARXIGA) 10 MG TABS tablet TAKE 1 TABLET BY MOUTH EVERY DAY   ezetimibe (ZETIA) 10 MG tablet Take 1 tablet (10 mg total) by mouth daily.   l-methylfolate-B6-B12 (METANX) 3-35-2 MG TABS Take 1 tablet by mouth daily.   metFORMIN (GLUCOPHAGE-XR) 500 MG 24 hr tablet Take 2 tablets (1,000 mg total) by mouth daily with breakfast.   Multiple Vitamin (MULTIVITAMIN) tablet Take 1 tablet by mouth daily.   Multiple Vitamins-Minerals (PRESERVISION AREDS 2 PO) Take 1 tablet by mouth in the morning and at bedtime.   Needle, Disp, (HYPODERMIC NEEDLE 23GX1") 23G X 1" MISC Use to give IM injection as directed   Omega-3 Fatty Acids (FISH OIL BURP-LESS PO) Take 1 capsule by mouth in the morning and at bedtime.   Plant Sterol Stanol-Pantethine (CHOLESTOFF COMPLETE PO) Take  1 capsule by mouth daily.   sildenafil (VIAGRA) 50 MG tablet Take 50 mg by mouth as needed for erectile dysfunction.   testosterone cypionate (DEPOTESTOSTERONE CYPIONATE) 200 MG/ML injection INJECT 1 ML('200mg'$ ) INTRAMUSCULARLY EVERY 14 DAYS.   traZODone (DESYREL) 50 MG tablet TAKE 0.5-1 TABLETS BY MOUTH AT BEDTIME AS NEEDED FOR SLEEP.     Allergies:   Atorvastatin calcium, Pravastatin sodium, Rosuvastatin calcium, and Statins   Social History   Socioeconomic  History   Marital status: Married    Spouse name: Not on file   Number of children: 1   Years of education: AAS   Highest education level: Not on file  Occupational History   Occupation: SECURITY OFFICER    Employer: [employer]  Tobacco Use   Smoking status: Former   Smokeless tobacco: Never   Tobacco comments:    smoked 30 years ago.  Substance and Sexual Activity   Alcohol use: Yes    Alcohol/week: 5.0 standard drinks of alcohol    Types: 5 Glasses of wine per week   Drug use: No   Sexual activity: Not on file  Other Topics Concern   Not on file  Social History Narrative   Not on file   Social Determinants of Health   Financial Resource Strain: Low Risk  (08/30/2021)   Overall Financial Resource Strain (CARDIA)    Difficulty of Paying Living Expenses: Not very hard  Food Insecurity: No Food Insecurity (06/20/2021)   Hunger Vital Sign    Worried About Running Out of Food in the Last Year: Never true    Ran Out of Food in the Last Year: Never true  Transportation Needs: No Transportation Needs (06/20/2021)   PRAPARE - Hydrologist (Medical): No    Lack of Transportation (Non-Medical): No  Physical Activity: Inactive (06/20/2021)   Exercise Vital Sign    Days of Exercise per Week: 0 days    Minutes of Exercise per Session: 0 min  Stress: Stress Concern Present (06/20/2021)   [place]    Feeling of Stress : To some extent  Social Connections: Moderately Integrated (06/20/2021)   Social Connection and Isolation Panel [NHANES]    Frequency of Communication with Friends and Family: More than three times a week    Frequency of Social Gatherings with Friends and Family: Once a week    Attends Religious Services: Never    Marine scientist or Organizations: Yes    Attends Music therapist: More than 4 times per year    Marital Status: Married     Family  History: The patient's family history includes Atrial fibrillation in his brother; Diabetes in his brother; Heart attack in his father. There is no history of Colon cancer, Esophageal cancer, Rectal cancer, or Stomach cancer.  ROS:   Please see the history of present illness.    All other systems reviewed and are negative.  EKGs/Labs/Other Studies Reviewed:    The following studies were reviewed today:  CT cardiac scoring 06/06/22: FINDINGS: Coronary arteries: Normal origins. Coronary Calcium Score: Left main: Left anterior descending artery: 55 Left circumflex artery: Right coronary artery: 150 Total: 206 Percentile: 58th Pericardium: Normal. Ascending Aorta: Dilated ascending aorta measuring 8m Non-cardiac: [facility]. IMPRESSION: 1. Coronary calcium score of 206. This was 58th percentile for age-, race-, and sex-matched controls. 2.  Dilated ascending aorta measuring 455m EKG:  EKG ordered today,  08/02/22.  The ekg ordered today demonstrates SR with 1st degree AV block at 82 bpm   Recent Labs: 06/05/2022: ALT 36; BUN 15; Creatinine, Ser 1.00; Hemoglobin 16.1; Platelets 184.0; Potassium 4.3; Sodium 137  Recent Lipid Panel    Component Value Date/Time   CHOL 225 (H) 06/05/2022 0902   TRIG 165.0 (H) 06/05/2022 0902   HDL 47.90 06/05/2022 0902   CHOLHDL 5 06/05/2022 0902   VLDL 33.0 06/05/2022 0902   LDLCALC 144 (H) 06/05/2022 0902   LDLDIRECT 146.0 07/01/2019 0904   Physical Exam:    VS:  BP 120/80 (BP Location: Left Arm, Patient Position: Sitting, Cuff Size: Large)   Pulse 82   Ht '6\' 3"'$  (1.905 m)   Wt 220 lb 3.2 oz (99.9 kg)   BMI 27.52 kg/m     Wt Readings from Last 3 Encounters:  08/02/22 220 lb 3.2 oz (99.9 kg)  07/11/22 219 lb (99.3 kg)  06/05/22 222 lb 3.2 oz (100.8 kg)     GEN: Well nourished, well developed in no acute distress HEENT: Normal NECK: No JVD; No carotid bruits LYMPHATICS: No  lymphadenopathy CARDIAC: RRR, no murmurs, rubs, gallops RESPIRATORY:  Clear to auscultation without rales, wheezing or rhonchi  ABDOMEN: Soft, non-tender, non-distended MUSCULOSKELETAL:  No edema; No deformity  SKIN: Warm and dry NEUROLOGIC:  Alert and oriented x 3 PSYCHIATRIC:  Normal affect   ASSESSMENT:    1. Nonocclusive coronary atherosclerosis of native coronary artery   2. Aneurysm of ascending aorta without rupture (HCC)   3. Statin myopathy   4. Mixed hyperlipidemia   5. Type 2 diabetes mellitus without complication, without long-term current use of insulin (HCC)   6. Cardiac risk counseling   7. Counseling on health promotion and disease prevention   8. Nutritional counseling   9. Exercise counseling   10. Encounter to discuss test results    PLAN:    Nonobstructive CAD Mixed hyperlipidemia Statin myopathy Type II diabetes -reviewed his test results at length today. Calcium score 206 -no symptoms -long history of statin intolerance and myopathy. Discussed options at length today -he would like to discuss nexletol and PCSK9i options in depth with PharmD team based on what his coverage would be -did extensive lifestyle counseling today -reviewed red flag warning signs that need immediate medical attention  Thoracic aortic aneurysm -repeat imaging in 6 mos, further evaluation based on whether size is significantly different. Did discuss difference between noncontrast and contrast imaging and how it can affect measurements -check BMET prior to CT  CV risk counseling and prevention -recommend heart healthy/Mediterranean diet, with whole grains, fruits, vegetable, fish, lean meats, nuts, and olive oil. Limit salt. -recommend moderate walking, 3-5 times/week for 30-50 minutes each session. Aim for at least 150 minutes.week. Goal should be pace of 3 miles/hours, or walking 1.5 miles in 30 minutes -recommend avoidance of tobacco products. Avoid excess alcohol.  Follow up:  6 months after CT angio or sooner if needed  Medication Adjustments/Labs and Tests Ordered: Current medicines are reviewed at length with the patient today.  Concerns regarding medicines are outlined above.  Orders Placed This Encounter  Procedures   CT ANGIO CHEST AORTA W/CM & OR WO/CM   Basic metabolic panel   AMB Referral to [facility] Pharm-D   EKG 12-Lead   No orders of the defined types were placed in this encounter.   Patient Instructions  Medication Instructions:  Your physician recommends that you continue on your current medications as directed. Please refer  to the Current Medication list given to you today.   *If you need a refill on your cardiac medications before your next appointment, please call your pharmacy*  Lab Work: BMET 1 WEEK PRIOR TO CT IN 6 MONTHS  If you have labs (blood work) drawn today and your tests are completely normal, you will receive your results only by: [place] (if you have MyChart) OR A paper copy in the mail If you have any lab test that is abnormal or we need to change your treatment, we will call you to review the results.  Testing/Procedures: Non-Cardiac CT scanning, (CAT scanning), is a noninvasive, special x-ray that produces cross-sectional images of the body using x-rays and a computer. CT scans help physicians diagnose and treat medical conditions. For some CT exams, a contrast material is used to enhance visibility in the area of the body being studied. CT scans provide greater clarity and reveal more details than regular x-ray exams. CT CHEST IN 6 MONTHS   Follow-Up: At [facility], you and your health needs are our priority.  As part of our continuing mission to provide you with exceptional heart care, we have created designated Provider Care Teams.  These Care Teams include your primary Cardiologist (physician) and Advanced Practice Providers (APPs -  Physician Assistants and Nurse Practitioners) who all work  together to provide you with the care you need, when you need it.  We recommend signing up for the patient portal called "MyChart".  Sign up information is provided on this After Visit Summary.  MyChart is used to connect with patients for Virtual Visits (Telemedicine).  Patients are able to view lab/test results, encounter notes, upcoming appointments, etc.  Non-urgent messages can be sent to your provider as well.   To learn more about what you can do with MyChart, go to [email or web address].    Your next appointment:   AFTER CT IN 6 month(s)  The format for your next appointment:   In Person  Provider:   Buford Dresser, MD    You have been referred to [place] as a scribe for Buford Dresser, MD.,have documented all relevant documentation on the behalf of Buford Dresser, MD,as directed by  Buford Dresser, MD while in the presence of Buford Dresser, MD.  I, Buford Dresser, MD, have reviewed all documentation for this visit. The documentation on 08/13/22 for the exam, diagnosis, procedures, and orders are all accurate and complete.   Signed, Buford Dresser, MD  08/02/2022     [place]

## 2022-08-02 NOTE — Patient Instructions (Signed)
Medication Instructions:  Your physician recommends that you continue on your current medications as directed. Please refer to the Current Medication list given to you today.   *If you need a refill on your cardiac medications before your next appointment, please call your pharmacy*  Lab Work: BMET 1 WEEK PRIOR TO CT IN 6 MONTHS  If you have labs (blood work) drawn today and your tests are completely normal, you will receive your results only by: [place] (if you have MyChart) OR A paper copy in the mail If you have any lab test that is abnormal or we need to change your treatment, we will call you to review the results.  Testing/Procedures: Non-Cardiac CT scanning, (CAT scanning), is a noninvasive, special x-ray that produces cross-sectional images of the body using x-rays and a computer. CT scans help physicians diagnose and treat medical conditions. For some CT exams, a contrast material is used to enhance visibility in the area of the body being studied. CT scans provide greater clarity and reveal more details than regular x-ray exams. CT CHEST IN 6 MONTHS   Follow-Up: At [facility], you and your health needs are our priority.  As part of our continuing mission to provide you with exceptional heart care, we have created designated Provider Care Teams.  These Care Teams include your primary Cardiologist (physician) and Advanced Practice Providers (APPs -  Physician Assistants and Nurse Practitioners) who all work together to provide you with the care you need, when you need it.  We recommend signing up for the patient portal called "MyChart".  Sign up information is provided on this After Visit Summary.  MyChart is used to connect with patients for Virtual Visits (Telemedicine).  Patients are able to view lab/test results, encounter notes, upcoming appointments, etc.  Non-urgent messages can be sent to your provider as well.   To learn more about what you can do with  MyChart, go to [email or web address].    Your next appointment:   AFTER CT IN 6 month(s)  The format for your next appointment:   In Person  Provider:   Buford Dresser, MD    You have been referred to [facility]

## 2022-08-23 ENCOUNTER — Ambulatory Visit (HOSPITAL_BASED_OUTPATIENT_CLINIC_OR_DEPARTMENT_OTHER): Payer: Medicare HMO | Admitting: Pharmacist Clinician (PhC)/ Clinical Pharmacy Specialist

## 2022-08-23 VITALS — Ht 75.0 in | Wt 218.8 lb

## 2022-08-23 DIAGNOSIS — E782 Mixed hyperlipidemia: Secondary | ICD-10-CM

## 2022-08-23 NOTE — Patient Instructions (Addendum)
Your Results:             Your most recent labs Goal  Total Cholesterol 225 < 200  Triglycerides 165 < 150  HDL (happy/good cholesterol) 47.9 > 40  LDL (lousy/bad cholesterol 144 < 70   Medication changes:  We will start the process to get Repatha covered by your insurance.  Once the prior authorization is complete, I will call/send a MyChart message to let you know and confirm pharmacy information.   You will take one injection every 14 days.    Lab orders:  We want to repeat labs after 2-3 months.  We will send you a lab order to remind you once we get closer to that time.      Thank you for choosing [facility]

## 2022-08-23 NOTE — Progress Notes (Signed)
Office Visit    Patient Name: [name] Date of Encounter: 08/24/2022  Primary Care Provider:  [name], MD Primary Cardiologist:  Buford Dresser, MD  Chief Complaint    Hyperlipidemia   Significant Past Medical History   CAD Calcium score 206 (58th percentile)  aneurysm Ascending aorta  DM2 10/23 A1c 8.4 - on metformin, farxiga     Allergies  Allergen Reactions   Atorvastatin Calcium Other (See Comments)    MYALGIAS   Pravastatin Sodium Other (See Comments)    LEG CRAMPS   Rosuvastatin Calcium Other (See Comments)    MYALGIA   Statins Other (See Comments)    We have tried several, cannot tolerate  - myalgias    History of Present Illness    Randy Orr is a 72 y.o. male patient of Dr Harrell Gave, in the office today to talk about options for cholesterol lowering.  He has tried 3 different statin drugs over the past several years, but unable to tolerate secondary to myalgias.    Insurance Carrier: [employer]  (no initial deductible) $47/month, then 25%  LDL Cholesterol goal:  LDL < 70  Current Medications:  ezetimibe 10 mg qd  Previously tried:  atorvastatin, pravastatin, rosuvastatin - all caused myalgias  Family Hx:   father died from MI, DM, obesity, tobacco at 99; mother now 92, broke hip last year, recovered, lives on her own; older brother with afib, obesity; younger brother DM2; son Lona Millard residual left paralysis  Social Hx: Tobacco: no Alcohol: occaisonal   Diet:    eggs/turkey sausage or oatmal, or avodado/english muffin; diet is rice/beans, faroe, some vegetables, likes salads; protein is beans, chicken and salmon;  has had issues with stress eating in past  Exercise:  active in theater prior to covid, physical acitng  Adherence Assessment  Do you ever forget to take your medication? '[]'$ Yes '[x]'$ No  Do you ever skip doses due to side effects? '[]'$ Yes '[x]'$ No  Do you have trouble affording your  medicines? '[]'$ Yes '[x]'$ No  Are you ever unable to pick up your medication due to transportation difficulties? '[]'$ Yes '[x]'$ No  Do you ever stop taking your medications because you don't believe they are helping? '[]'$ Yes '[x]'$ No    Accessory Clinical Findings   Lab Results  Component Value Date   CHOL 225 (H) 06/05/2022   HDL 47.90 06/05/2022   LDLCALC 144 (H) 06/05/2022   LDLDIRECT 146.0 07/01/2019   TRIG 165.0 (H) 06/05/2022   CHOLHDL 5 06/05/2022    Lab Results  Component Value Date   ALT 36 06/05/2022   AST 23 06/05/2022   ALKPHOS 56 06/05/2022   BILITOT 0.7 06/05/2022   Lab Results  Component Value Date   CREATININE 1.00 06/05/2022   BUN 15 06/05/2022   NA 137 06/05/2022   K 4.3 06/05/2022   CL 99 06/05/2022   CO2 30 06/05/2022   Lab Results  Component Value Date   HGBA1C 8.4 (H) 06/05/2022    Home Medications    Current Outpatient Medications  Medication Sig Dispense Refill   ALPRAZolam (XANAX) 0.5 MG tablet Take 0.5-1 tablets (0.25-0.5 mg total) by mouth 2 (two) times daily as needed for anxiety. 20 tablet 1   cyclobenzaprine (FLEXERIL) 10 MG tablet Take 1 tablet (10 mg total) by mouth daily as needed for muscle spasms. 30 tablet 0   dapagliflozin propanediol (FARXIGA) 10 MG TABS tablet TAKE 1 TABLET BY MOUTH EVERY DAY 30 tablet 6   ezetimibe (ZETIA)  10 MG tablet Take 1 tablet (10 mg total) by mouth daily. 30 tablet 5   l-methylfolate-B6-B12 (METANX) 3-35-2 MG TABS Take 1 tablet by mouth daily. 60 tablet 1   metFORMIN (GLUCOPHAGE-XR) 500 MG 24 hr tablet Take 2 tablets (1,000 mg total) by mouth daily with breakfast. 180 tablet 3   Multiple Vitamin (MULTIVITAMIN) tablet Take 1 tablet by mouth daily.     Multiple Vitamins-Minerals (PRESERVISION AREDS 2 PO) Take 1 tablet by mouth in the morning and at bedtime.     Needle, Disp, (HYPODERMIC NEEDLE 23GX1") 23G X 1" MISC Use to give IM injection as directed 50 each 1   Omega-3 Fatty Acids (FISH OIL BURP-LESS PO) Take 1  capsule by mouth in the morning and at bedtime.     Plant Sterol Stanol-Pantethine (CHOLESTOFF COMPLETE PO) Take 1 capsule by mouth daily.     sildenafil (VIAGRA) 50 MG tablet Take 50 mg by mouth as needed for erectile dysfunction.     testosterone cypionate (DEPOTESTOSTERONE CYPIONATE) 200 MG/ML injection INJECT 1 ML('200mg'$ ) INTRAMUSCULARLY EVERY 14 DAYS. 6 mL 1   traZODone (DESYREL) 50 MG tablet TAKE 0.5-1 TABLETS BY MOUTH AT BEDTIME AS NEEDED FOR SLEEP. 90 tablet 1   No current facility-administered medications for this visit.     Assessment & Plan    Hyperlipidemia Assessment: Patient with ASCVD not at LDL goal of < 70 Most recent LDL 144 on ezetimibe 10 mg  Has been compliant with ezetimibe :  Not able to tolerate statins secondary to myalgias Reviewed options for lowering LDL cholesterol, including ezetimibe, PCSK-9 inhibitors, bempedoic acid and inclisiran.  Discussed mechanisms of action, dosing, side effects, potential decreases in LDL cholesterol and costs.  Also reviewed potential options for patient assistance.  Plan: Patient agreeable to starting PCSK9 inhibitor Repeat labs after:  3 months Lipid Liver function Patient was given information on [place] grant/signed up for [employer] while in office today - declined   Tommy Medal, PharmD CPP [facility] [address] [place]  [place], [place] [address] [phone]  08/24/2022, 11:08 AM

## 2022-08-24 ENCOUNTER — Encounter (HOSPITAL_BASED_OUTPATIENT_CLINIC_OR_DEPARTMENT_OTHER): Payer: Self-pay | Admitting: Pharmacist Clinician (PhC)/ Clinical Pharmacy Specialist

## 2022-08-24 NOTE — Assessment & Plan Note (Signed)
Assessment: Patient with ASCVD not at LDL goal of < 70 Most recent LDL 144 on ezetimibe 10 mg  Has been compliant with ezetimibe :  Not able to tolerate statins secondary to myalgias Reviewed options for lowering LDL cholesterol, including ezetimibe, PCSK-9 inhibitors, bempedoic acid and inclisiran.  Discussed mechanisms of action, dosing, side effects, potential decreases in LDL cholesterol and costs.  Also reviewed potential options for patient assistance.  Plan: Patient agreeable to starting PCSK9 inhibitor Repeat labs after:  3 months Lipid Liver function Patient was given information on [place] grant/signed up for [employer] while in office today - declined

## 2022-08-27 ENCOUNTER — Telehealth: Payer: Self-pay | Admitting: Pharmacist Clinician (PhC)/ Clinical Pharmacy Specialist

## 2022-08-27 MED ORDER — REPATHA SURECLICK 140 MG/ML ~~LOC~~ SOAJ: 140.0000 mg | SUBCUTANEOUS | 12 refills | Status: DC

## 2022-08-27 NOTE — Telephone Encounter (Signed)
PA for Repatha submitted - Key [id number]  Approved to 08/12/23  Patient notified

## 2022-09-12 ENCOUNTER — Ambulatory Visit (INDEPENDENT_AMBULATORY_CARE_PROVIDER_SITE_OTHER): Payer: Medicare HMO | Admitting: Pharmacist

## 2022-09-12 DIAGNOSIS — R931 Abnormal findings on diagnostic imaging of heart and coronary circulation: Secondary | ICD-10-CM

## 2022-09-12 DIAGNOSIS — E785 Hyperlipidemia, unspecified: Secondary | ICD-10-CM

## 2022-09-12 DIAGNOSIS — Z79899 Other long term (current) drug therapy: Secondary | ICD-10-CM

## 2022-09-12 DIAGNOSIS — M791 Myalgia, unspecified site: Secondary | ICD-10-CM

## 2022-09-12 DIAGNOSIS — E119 Type 2 diabetes mellitus without complications: Secondary | ICD-10-CM

## 2022-09-12 DIAGNOSIS — T466X5A Adverse effect of antihyperlipidemic and antiarteriosclerotic drugs, initial encounter: Secondary | ICD-10-CM

## 2022-09-12 NOTE — Progress Notes (Signed)
Pharmacy Note  09/12/2022 Name: Randy Orr MRN: [id number] DOB: September 28, 1950  Subjective: Randy Orr is a 72 y.o. year old male who is a primary care patient of Copland, Gay Filler, MD. Clinical Pharmacist Practitioner referral was placed to assist with medication management, diabetes and hyperlipidemia.    Engaged with patient by telephone for follow up visit today.   Medication management:  Reviewed meds and updated med list.    Type 2 DM: Patient is tolerating increase in [place] to '10mg'$  daily. He is starting to follow Dr Janene Harvey program / book to reverse type 2 DM. Patient has considered GLP1 but ultimately decided to not start due to potential side effects and cost.   Hyperlipidemia:  Intolerant to several statins. Even tried to take CoEnzyme Q10 to see if helped with muscle pain but did not help.  He has taken rosuvastatin '5mg'$  daily and every 3 days - had leg cramps, myalgias and felt horrible. (07/04/2019 to 11/04/2019) Tried pravastatin '40mg'$  09/23/2011 to 09/02/2012 - stopped due to leg cramps / myalgias.  Tried atorvastatin - dose unknown - stopped due to myopathy.  Last LDL was above goal. He is taking Fish Oil and Cholestoff.  Coronary Calcium Score was read after our last visit and noted to be elevated. Patient met with PharmD in cardiologist office and he has started Repatha.  He is also taking ezetimibe '10mg'$  daily   CT of chest 06/06/2022 showed an aneurysm.   Objective: Review of patient status, including review of consultants reports, laboratory and other test data, was performed as part of comprehensive.  Lab Results  Component Value Date   CREATININE 1.00 06/05/2022   CREATININE 1.14 03/12/2021   CREATININE 1.16 06/19/2020    Lab Results  Component Value Date   HGBA1C 8.4 (H) 06/05/2022       Component Value Date/Time   CHOL 225 (H) 06/05/2022 0902   TRIG 165.0 (H) 06/05/2022 0902   HDL 47.90 06/05/2022 0902   CHOLHDL 5 06/05/2022 0902    VLDL 33.0 06/05/2022 0902   LDLCALC 144 (H) 06/05/2022 0902   LDLDIRECT 146.0 07/01/2019 0904     Clinical ASCVD: No  but coronary calcium score pending The 10-year ASCVD risk score (Arnett DK, et al., 2019) is: 29.4%   Values used to calculate the score:     Age: 72 years     Sex: Male     Is Non-Hispanic African American: No     Diabetic: Yes     Tobacco smoker: No     Systolic Blood Pressure: 665 mmHg     Is BP treated: No     HDL Cholesterol: 47.9 mg/dL     Total Cholesterol: 225 mg/dL    BP Readings from Last 3 Encounters:  08/02/22 120/80  06/05/22 110/64  10/10/21 132/82     Allergies  Allergen Reactions   Atorvastatin Calcium Other (See Comments)    MYALGIAS   Pravastatin Sodium Other (See Comments)    LEG CRAMPS   Rosuvastatin Calcium Other (See Comments)    MYALGIA   Statins Other (See Comments)    We have tried several, cannot tolerate  - myalgias    Medications Reviewed Today     Reviewed by Rockne Menghini, RPH-CPP (Pharmacist) on 08/24/22 at 1107  Med List Status: <None>   Medication Order Taking? Sig Documenting Provider Last Dose Status Informant  ALPRAZolam (XANAX) 0.5 MG tablet [id number] No Take 0.5-1 tablets (0.25-0.5 mg total) by mouth 2 (two)  times daily as needed for anxiety. Copland, Gay Filler, MD Taking Active   cyclobenzaprine (FLEXERIL) 10 MG tablet [id number] No Take 1 tablet (10 mg total) by mouth daily as needed for muscle spasms. Copland, Gay Filler, MD Taking Active   dapagliflozin propanediol (FARXIGA) 10 MG TABS tablet [id number] No TAKE 1 TABLET BY MOUTH EVERY DAY Copland, Gay Filler, MD Taking Active   ezetimibe (ZETIA) 10 MG tablet [id number] No Take 1 tablet (10 mg total) by mouth daily. Copland, Gay Filler, MD Taking Active   l-methylfolate-B6-B12 [facility]) 3-35-2 MG TABS [id number] No Take 1 tablet by mouth daily. Dohmeier, Asencion Partridge, MD Taking Active            Med Note Quinn Axe Nov 01, 2019 11:53 AM) Emilio Math  metFORMIN  (GLUCOPHAGE-XR) 500 MG 24 hr tablet [id number] No Take 2 tablets (1,000 mg total) by mouth daily with breakfast. Copland, Gay Filler, MD Taking Active   Multiple Vitamin (MULTIVITAMIN) tablet [id number] No Take 1 tablet by mouth daily. [provider] Taking Active            Med Note Quinn Axe Nov 01, 2019 11:55 AM) Janalee Dane Silver  Multiple Vitamins-Minerals (PRESERVISION AREDS 2 PO) [id number] No Take 1 tablet by mouth in the morning and at bedtime. [provider] Taking Active   Needle, Disp, (HYPODERMIC NEEDLE 23GX1") 23G X 1" MISC [id number] No Use to give IM injection as directed Copland, Gay Filler, MD Taking Active   Omega-3 Fatty Acids (FISH OIL BURP-LESS PO) [id number] No Take 1 capsule by mouth in the morning and at bedtime. [provider] Taking Active   Plant Sterol Stanol-Pantethine (CHOLESTOFF COMPLETE PO) [id number] No Take 1 capsule by mouth daily. [provider] Taking Active   sildenafil (VIAGRA) 50 MG tablet [id number] No Take 50 mg by mouth as needed for erectile dysfunction. [provider] Taking Active   testosterone cypionate (DEPOTESTOSTERONE CYPIONATE) 200 MG/ML injection [id number] No INJECT 1 ML('200mg'$ ) INTRAMUSCULARLY EVERY 14 DAYS. Copland, Gay Filler, MD Taking Active   traZODone (DESYREL) 50 MG tablet [id number] No TAKE 0.5-1 TABLETS BY MOUTH AT BEDTIME AS NEEDED FOR SLEEP. Copland, Gay Filler, MD Taking Active             Patient Active Problem List   Diagnosis Date Noted   Thoracic aortic aneurysm ([place]) 06/10/2022   Acute pain of right knee 10/16/2021   Statin myopathy 09/27/2020   Left knee pain 06/06/2016   Peripheral sensory neuropathy due to type 2 diabetes mellitus ([place]) 05/17/2014   Snoring 05/17/2014   Obesity 05/17/2014   Diabetes mellitus ([place]) 09/23/2011   Hypogonadism male 09/23/2011   Hyperlipidemia 09/23/2011     Medication Assistance:   Screened for patient assistance program for [place] or  Ozempic. Household income did not qualify for either Farxiga or Ozempic medication assistance program.   Assisted patient in applying for [place] [employer] Fund to help with cost of Repatha. Patient was approved 08/13/2022 thru 08/13/2023 for $2500   Assessment / Plan: Type 2 DM: uncontrolled Continue Farxiga to '10mg'$  daily.  Encouraged plant based diet and increase exercise.  Per Dr Julianne Rice recommendations try Dr Janene Harvey program / book to reverse type 2 DM.  Can consider GLP1 or Mounjaro if A1c not at goal when next checked.   Hyperlipidemia with elevated CAC: Not at goal of < 70. Intolerant to several statins.  Continue ezetimibe '10mg'$  daily and Repatha.  Ran estimate of 2024 cost for Repatha.  Current copay is $47 per month but estimate that patient will hit coverage gap in June 2024 and cost will increase to $175 to 200 per month.  Applied and approved for [employer] for hypercholesterolemia  Medication management:  Reviewed and updated medication list Reviewed refill history and adherence  Follow Up:  Telephone follow up appointment with care management team member scheduled for:  3 to 6 months.  Will see Dr Lorelei Pont in April 2024 for labs and follow up.   Cherre Robins, PharmD Clinical Pharmacist [facility] [place] [facility] [phone]

## 2022-09-25 ENCOUNTER — Other Ambulatory Visit: Payer: Self-pay | Admitting: Family Medicine

## 2022-11-01 ENCOUNTER — Encounter: Payer: Self-pay | Admitting: Pharmacist

## 2022-11-11 ENCOUNTER — Other Ambulatory Visit: Payer: Self-pay | Admitting: Pharmacist Clinician (PhC)/ Clinical Pharmacy Specialist

## 2022-11-11 ENCOUNTER — Encounter: Payer: Self-pay | Admitting: Pharmacist Clinician (PhC)/ Clinical Pharmacy Specialist

## 2022-11-11 DIAGNOSIS — E782 Mixed hyperlipidemia: Secondary | ICD-10-CM

## 2022-11-11 NOTE — Progress Notes (Signed)
Lipid labs

## 2022-11-28 ENCOUNTER — Other Ambulatory Visit: Payer: Self-pay | Admitting: Family Medicine

## 2022-11-28 DIAGNOSIS — F5102 Adjustment insomnia: Secondary | ICD-10-CM

## 2022-12-01 NOTE — Progress Notes (Unsigned)
[facility] [address] [place], [place] [address] [phone]  Date:  12/04/2022   Name:  Randy Orr   DOB:  18-Mar-1951   MRN:  [id number]  PCP:  Pearline Cables, MD    Chief Complaint: No chief complaint on file.   History of Present Illness:  Randy Orr is a 72 y.o. very pleasant male patient who presents with the following:  Pt seen today for periodic recheck visit Last seen by myself in October  History of diabetes, peripheral neuropathy, hypogonadism, hyperlipidemia He is unable to tolerate statins   He is treated with injectable testosterone for his hypogonadism   Labs in October showed an increase in his A1c as well as mildly elevated coronary calcium, 50th percentile.  He has statin myopathy I referred him to see cardiology, got him started on Repatha-continue Zetia  Lab Results  Component Value Date   HGBA1C 8.4 (H) 06/05/2022   Most recent testosterone level in October, total 740 PSA has been stable Lab Results  Component Value Date   PSA 2.76 06/05/2022   PSA 2.34 10/10/2021   PSA 2.61 03/12/2021      Patient Active Problem List   Diagnosis Date Noted   Thoracic aortic aneurysm 06/10/2022   Acute pain of right knee 10/16/2021   Statin myopathy 09/27/2020   Left knee pain 06/06/2016   Peripheral sensory neuropathy due to type 2 diabetes mellitus 05/17/2014   Snoring 05/17/2014   Obesity 05/17/2014   Diabetes mellitus 09/23/2011   Hypogonadism male 09/23/2011   Hyperlipidemia 09/23/2011    Past Medical History:  Diagnosis Date   Diabetes mellitus without complication    DM type 2   GERD (gastroesophageal reflux disease)    Hyperlipidemia    Low testosterone    Obesity 05/17/2014   Peripheral sensory neuropathy due to type 2 diabetes mellitus 05/17/2014   Snoring 05/17/2014    Past Surgical History:  Procedure Laterality Date   COLONOSCOPY  07/18/2014    Social  History   Tobacco Use   Smoking status: Former   Smokeless tobacco: Never   Tobacco comments:    smoked 30 years ago.  Substance Use Topics   Alcohol use: Yes    Alcohol/week: 5.0 standard drinks of alcohol    Types: 5 Glasses of wine per week   Drug use: No    Family History  Problem Relation Age of Onset   Heart attack Father    Diabetes Brother    Atrial fibrillation Brother    Colon cancer Neg Hx    Esophageal cancer Neg Hx    Rectal cancer Neg Hx    Stomach cancer Neg Hx     Allergies  Allergen Reactions   Atorvastatin Calcium Other (See Comments)    MYALGIAS   Pravastatin Sodium Other (See Comments)    LEG CRAMPS   Rosuvastatin Calcium Other (See Comments)    MYALGIA   Statins Other (See Comments)    We have tried several, cannot tolerate  - myalgias    Medication list has been reviewed and updated.  Current Outpatient Medications on File Prior to Visit  Medication Sig Dispense Refill   ALPRAZolam (XANAX) 0.5 MG tablet Take 0.5-1 tablets (0.25-0.5 mg total) by mouth 2 (two) times daily as needed for anxiety. 20 tablet 1   cyclobenzaprine (FLEXERIL) 10 MG tablet Take 1 tablet (10 mg total) by mouth daily as needed for muscle spasms.  30 tablet 0   dapagliflozin propanediol (FARXIGA) 10 MG TABS tablet TAKE 1 TABLET BY MOUTH EVERY DAY 30 tablet 6   Evolocumab (REPATHA SURECLICK) 140 MG/ML SOAJ Inject 140 mg into the skin every 14 (fourteen) days. 2 mL 12   ezetimibe (ZETIA) 10 MG tablet Take 1 tablet (10 mg total) by mouth daily. 30 tablet 5   l-methylfolate-B6-B12 (METANX) 3-35-2 MG TABS Take 1 tablet by mouth daily. 60 tablet 1   metFORMIN (GLUCOPHAGE-XR) 500 MG 24 hr tablet TAKE 2 TABLETS BY MOUTH EVERY DAY WITH BREAKFAST 180 tablet 3   Multiple Vitamin (MULTIVITAMIN) tablet Take 1 tablet by mouth daily.     Multiple Vitamins-Minerals (PRESERVISION AREDS 2 PO) Take 1 tablet by mouth in the morning and at bedtime.     Needle, Disp, (HYPODERMIC NEEDLE 23GX1") 23G  X 1" MISC Use to give IM injection as directed 50 each 1   Omega-3 Fatty Acids (FISH OIL BURP-LESS PO) Take 1 capsule by mouth in the morning and at bedtime.     Plant Sterol Stanol-Pantethine (CHOLESTOFF COMPLETE PO) Take 1 capsule by mouth daily.     sildenafil (VIAGRA) 50 MG tablet Take 50 mg by mouth as needed for erectile dysfunction.     testosterone cypionate (DEPOTESTOSTERONE CYPIONATE) 200 MG/ML injection INJECT 1 ML(200mg ) INTRAMUSCULARLY EVERY 14 DAYS. 6 mL 1   traZODone (DESYREL) 50 MG tablet TAKE 1/2 TO 1 TABLET BY MOUTH AT BEDTIME AS NEEDED FOR SLEEP 90 tablet 1   No current facility-administered medications on file prior to visit.    Review of Systems:  As per HPI- otherwise negative.   Physical Examination: There were no vitals filed for this visit. There were no vitals filed for this visit. There is no height or weight on file to calculate BMI. Ideal Body Weight:    GEN: no acute distress. HEENT: Atraumatic, Normocephalic.  Ears and Nose: No external deformity. CV: RRR, No M/G/R. No JVD. No thrill. No extra heart sounds. PULM: CTA B, no wheezes, crackles, rhonchi. No retractions. No resp. distress. No accessory muscle use. ABD: S, NT, ND, +BS. No rebound. No HSM. EXTR: No c/c/e PSYCH: Normally interactive. Conversant.    Assessment and Plan: ***  Signed Abbe Amsterdam, MD

## 2022-12-01 NOTE — Patient Instructions (Incomplete)
It was good to see you again today, I will be in touch with your labs.  Assuming all is well please see me in about 6 months Try the metronidazole gel for your face- let me know how this seems to be working for you I would definitely see if you qualify for assistance with your [place]- check out their patient assistance program

## 2022-12-04 ENCOUNTER — Ambulatory Visit (INDEPENDENT_AMBULATORY_CARE_PROVIDER_SITE_OTHER): Payer: Medicare HMO | Admitting: Family Medicine

## 2022-12-04 ENCOUNTER — Encounter: Payer: Self-pay | Admitting: Family Medicine

## 2022-12-04 VITALS — BP 118/70 | HR 79 | Temp 97.6°F | Resp 18 | Ht 75.0 in | Wt 220.6 lb

## 2022-12-04 DIAGNOSIS — M791 Myalgia, unspecified site: Secondary | ICD-10-CM | POA: Diagnosis not present

## 2022-12-04 DIAGNOSIS — Z5181 Encounter for therapeutic drug level monitoring: Secondary | ICD-10-CM | POA: Diagnosis not present

## 2022-12-04 DIAGNOSIS — R931 Abnormal findings on diagnostic imaging of heart and coronary circulation: Secondary | ICD-10-CM | POA: Diagnosis not present

## 2022-12-04 DIAGNOSIS — E119 Type 2 diabetes mellitus without complications: Secondary | ICD-10-CM

## 2022-12-04 DIAGNOSIS — E785 Hyperlipidemia, unspecified: Secondary | ICD-10-CM | POA: Diagnosis not present

## 2022-12-04 DIAGNOSIS — L719 Rosacea, unspecified: Secondary | ICD-10-CM | POA: Diagnosis not present

## 2022-12-04 DIAGNOSIS — T466X5A Adverse effect of antihyperlipidemic and antiarteriosclerotic drugs, initial encounter: Secondary | ICD-10-CM | POA: Diagnosis not present

## 2022-12-04 DIAGNOSIS — E291 Testicular hypofunction: Secondary | ICD-10-CM

## 2022-12-04 LAB — PSA: PSA: 2.54 ng/mL (ref 0.10–4.00)

## 2022-12-04 LAB — CBC
HCT: 49.6 % (ref 39.0–52.0)
Hemoglobin: 17.1 g/dL — ABNORMAL HIGH (ref 13.0–17.0)
MCHC: 34.6 g/dL (ref 30.0–36.0)
MCV: 93.4 fl (ref 78.0–100.0)
Platelets: 233 10*3/uL (ref 150.0–400.0)
RBC: 5.31 Mil/uL (ref 4.22–5.81)
RDW: 13.1 % (ref 11.5–15.5)
WBC: 6.5 10*3/uL (ref 4.0–10.5)

## 2022-12-04 LAB — LIPID PANEL
Cholesterol: 81 mg/dL (ref 0–200)
HDL: 51.2 mg/dL (ref >39.00)
LDL Cholesterol: 10 mg/dL (ref 0–99)
NonHDL: 29.4
Total CHOL/HDL Ratio: 2
Triglycerides: 99 mg/dL (ref 0.0–149.0)
VLDL: 19.8 mg/dL (ref 0.0–40.0)

## 2022-12-04 LAB — BASIC METABOLIC PANEL
BUN: 17 mg/dL (ref 6–23)
CO2: 28 mEq/L (ref 19–32)
Calcium: 9.2 mg/dL (ref 8.4–10.5)
Chloride: 100 mEq/L (ref 96–112)
Creatinine, Ser: 1.11 mg/dL (ref 0.40–1.50)
GFR: 67.67 mL/min (ref >60.00)
Glucose, Bld: 150 mg/dL — ABNORMAL HIGH (ref 70–99)
Potassium: 4.3 mEq/L (ref 3.5–5.1)
Sodium: 138 mEq/L (ref 135–145)

## 2022-12-04 LAB — HEMOGLOBIN A1C: Hgb A1c MFr Bld: 8.4 % — ABNORMAL HIGH (ref 4.6–6.5)

## 2022-12-04 MED ORDER — EZETIMIBE 10 MG PO TABS: 10.0000 mg | ORAL_TABLET | Freq: Every day | ORAL | 3 refills | Status: DC

## 2022-12-04 MED ORDER — DAPAGLIFLOZIN PROPANEDIOL 10 MG PO TABS
10.0000 mg | ORAL_TABLET | Freq: Every day | ORAL | 12 refills | Status: DC
Start: 2022-12-04 — End: 2023-01-21

## 2022-12-04 MED ORDER — METRONIDAZOLE 1 % EX GEL
Freq: Every day | CUTANEOUS | 1 refills | Status: DC
Start: 2022-12-04 — End: 2023-01-21

## 2022-12-05 ENCOUNTER — Encounter: Payer: Self-pay | Admitting: Family Medicine

## 2022-12-05 LAB — TESTOSTERONE TOTAL,FREE,BIO, MALES
Albumin: 4 g/dL (ref 3.6–5.1)
Sex Hormone Binding: 32 nmol/L (ref 22–77)
Testosterone, Bioavailable: 226.2 ng/dL (ref 110.0–575.0)
Testosterone, Free: 123 pg/mL (ref 46.0–224.0)
Testosterone: 778 ng/dL (ref 250–827)

## 2022-12-05 MED ORDER — OZEMPIC (0.25 OR 0.5 MG/DOSE) 2 MG/3ML ~~LOC~~ SOPN
PEN_INJECTOR | SUBCUTANEOUS | 1 refills | Status: DC
Start: 2022-12-05 — End: 2022-12-06

## 2022-12-06 MED ORDER — TIRZEPATIDE 2.5 MG/0.5ML ~~LOC~~ SOAJ
2.5000 mg | SUBCUTANEOUS | 2 refills | Status: DC
Start: 2022-12-06 — End: 2023-01-27

## 2022-12-06 NOTE — Addendum Note (Signed)
Addended by: Abbe Amsterdam C on: 12/06/2022 12:12 PM   Modules accepted: Orders

## 2022-12-09 ENCOUNTER — Ambulatory Visit: Payer: Medicare HMO | Admitting: Family Medicine

## 2022-12-12 ENCOUNTER — Encounter (HOSPITAL_BASED_OUTPATIENT_CLINIC_OR_DEPARTMENT_OTHER): Payer: Self-pay | Admitting: Pharmacist Clinician (PhC)/ Clinical Pharmacy Specialist

## 2022-12-26 ENCOUNTER — Other Ambulatory Visit: Payer: Self-pay | Admitting: Family Medicine

## 2022-12-26 DIAGNOSIS — E291 Testicular hypofunction: Secondary | ICD-10-CM

## 2022-12-26 NOTE — Telephone Encounter (Signed)
Pt called stating that he is completely out of this medication and is set to heave his next injection today (5.16.24).

## 2023-01-14 ENCOUNTER — Ambulatory Visit (HOSPITAL_BASED_OUTPATIENT_CLINIC_OR_DEPARTMENT_OTHER)
Admission: RE | Admit: 2023-01-14 | Discharge: 2023-01-14 | Disposition: A | Discharge status: Home / Self Care | Payer: Medicare HMO | Source: Ambulatory Visit | Attending: Cardiology | Admitting: Cardiology

## 2023-01-14 DIAGNOSIS — I7121 Aneurysm of the ascending aorta, without rupture: Secondary | ICD-10-CM | POA: Insufficient documentation

## 2023-01-14 DIAGNOSIS — I712 Thoracic aortic aneurysm, without rupture, unspecified: Secondary | ICD-10-CM | POA: Diagnosis not present

## 2023-01-14 LAB — POCT I-STAT CREATININE: Creatinine, Ser: 1.2 mg/dL (ref 0.61–1.24)

## 2023-01-14 MED ORDER — IOHEXOL 350 MG/ML SOLN
100.0000 mL | Freq: Once | INTRAVENOUS | Status: AC | PRN
  Administered 2023-01-14: 100 mL via INTRAVENOUS

## 2023-01-20 ENCOUNTER — Encounter: Payer: Self-pay | Admitting: Family Medicine

## 2023-01-20 DIAGNOSIS — L719 Rosacea, unspecified: Secondary | ICD-10-CM

## 2023-01-21 ENCOUNTER — Other Ambulatory Visit: Payer: Self-pay | Admitting: Family Medicine

## 2023-01-21 ENCOUNTER — Ambulatory Visit (HOSPITAL_BASED_OUTPATIENT_CLINIC_OR_DEPARTMENT_OTHER): Payer: Medicare HMO | Admitting: Cardiology

## 2023-01-21 ENCOUNTER — Telehealth: Payer: Self-pay | Admitting: Family Medicine

## 2023-01-21 ENCOUNTER — Encounter: Payer: Self-pay | Admitting: Family Medicine

## 2023-01-21 ENCOUNTER — Encounter (HOSPITAL_BASED_OUTPATIENT_CLINIC_OR_DEPARTMENT_OTHER): Payer: Self-pay | Admitting: Cardiology

## 2023-01-21 VITALS — BP 127/80 | HR 81 | Ht 75.0 in | Wt 215.0 lb

## 2023-01-21 DIAGNOSIS — Z712 Person consulting for explanation of examination or test findings: Secondary | ICD-10-CM

## 2023-01-21 DIAGNOSIS — E782 Mixed hyperlipidemia: Secondary | ICD-10-CM | POA: Diagnosis not present

## 2023-01-21 DIAGNOSIS — E119 Type 2 diabetes mellitus without complications: Secondary | ICD-10-CM | POA: Diagnosis not present

## 2023-01-21 DIAGNOSIS — Z7984 Long term (current) use of oral hypoglycemic drugs: Secondary | ICD-10-CM

## 2023-01-21 DIAGNOSIS — I2510 Atherosclerotic heart disease of native coronary artery without angina pectoris: Secondary | ICD-10-CM | POA: Diagnosis not present

## 2023-01-21 DIAGNOSIS — M791 Myalgia, unspecified site: Secondary | ICD-10-CM | POA: Diagnosis not present

## 2023-01-21 DIAGNOSIS — T466X5A Adverse effect of antihyperlipidemic and antiarteriosclerotic drugs, initial encounter: Secondary | ICD-10-CM | POA: Diagnosis not present

## 2023-01-21 DIAGNOSIS — E041 Nontoxic single thyroid nodule: Secondary | ICD-10-CM

## 2023-01-21 DIAGNOSIS — L719 Rosacea, unspecified: Secondary | ICD-10-CM

## 2023-01-21 DIAGNOSIS — R911 Solitary pulmonary nodule: Secondary | ICD-10-CM

## 2023-01-21 MED ORDER — BRIMONIDINE TARTRATE 0.33 % EX GEL
1.0000 | Freq: Every day | CUTANEOUS | 1 refills | Status: DC
Start: 2023-01-21 — End: 2023-04-02

## 2023-01-21 NOTE — Telephone Encounter (Signed)
PA initiated via Covermymeds; KEY: [id number]. Awaiting determination.

## 2023-01-21 NOTE — Addendum Note (Signed)
Addended by: Abbe Amsterdam C on: 01/21/2023 11:12 AM   Modules accepted: Orders

## 2023-01-21 NOTE — Progress Notes (Signed)
Cardiology Office Note:  .   Date:  01/21/2023  ID:  Randy Orr, DOB 10-29-1950, MRN [id number] PCP: Pearline Cables, MD  [facility] Providers Cardiologist:  Jodelle Red, MD {  History of Present Illness: Randy Orr   Randy Orr is a 72 y.o. male with a hx of HLD, DM, and statin intolerance (atorvastatin, rosuvastatin, pravastatin) now on PCKS9i.  CV history: He had a calcium scoring CT on 06/06/22 which showed dilated ascending aorta measuring 42mm and a total sore of 206. His father passed away at 39 y.o. from a MI and his brother has A-fib.    Today: Reviewed CT angio today. Aortic measurements as below. Pulmonary nodule, left thyroid nodule noted as well. 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   Doing well on Mounjaro and and repatha. Notes that eyes are blurry when he wakes up in the morning, concerned this has worsened with Mounjaro, but is manageable.  ROS: Denies chest pain, shortness of breath at rest or with normal exertion. No PND, orthopnea, LE edema or unexpected weight gain. No syncope or palpitations. ROS otherwise negative except as noted.   Studies Reviewed: Randy Orr    EKG:  not ordered today  CT angio 01/14/23 Aortic Root:  --Valve: 2.8 cm  --Sinuses: 4.5 cm  --Sinotubular Junction: 3.5 cm   Thoracic Aorta:  --Ascending Aorta: 3.9 cm  --Aortic Arch: 3.5 cm  --Descending Aorta: 2.7 cm  Physical Exam:   VS:  BP 127/80 (BP Location: Left Arm, Patient Position: Sitting, Cuff Size: Normal)   Pulse 81   Ht 6\' 3"  (1.905 m)   Wt 215 lb (97.5 kg)   SpO2 98%   BMI 26.87 kg/m    Wt Readings from Last 3 Encounters:  01/21/23 215 lb (97.5 kg)  12/04/22 220 lb 9.6 oz (100.1 kg)  08/24/22 218 lb 12.8 oz (99.2 kg)    GEN: Well nourished, well developed in no acute distress HEENT: Normal, moist mucous membranes NECK: No JVD CARDIAC: regular rhythm, normal S1 and S2, no rubs or gallops. No murmur. VASCULAR: Radial and DP pulses 2+ bilaterally. No  carotid bruits RESPIRATORY:  Clear to auscultation without rales, wheezing or rhonchi  ABDOMEN: Soft, non-tender, non-distended MUSCULOSKELETAL:  Ambulates independently SKIN: Warm and dry, no edema NEUROLOGIC:  Alert and oriented x 3. No focal neuro deficits noted. PSYCHIATRIC:  Normal affect    ASSESSMENT AND PLAN: .   Nonobstructive CAD Mixed hyperlipidemia Statin myalgia Type II diabetes -Calcium score 206 -no symptoms -doing well on PCSK9i -doing well on Mounjaro low dose. Last A1c 8.4.  -reviewed red flag warning signs that need immediate medical attention   Thoracic aortic aneurysm -reviewed CT angio today. Incidental findings of lung nodule and thyroid nodule. Will forward on to Dr. Patsy Lager. -sinuses of Valsalva enlarge but rest of aorta is not significantly dilated.  -follow in 1 year if not seen sooner on other imaging.   CV risk counseling and prevention -recommend heart healthy/Mediterranean diet, with whole grains, fruits, vegetable, fish, lean meats, nuts, and olive oil. Limit salt. -recommend moderate walking, 3-5 times/week for 30-50 minutes each session. Aim for at least 150 minutes.week. Goal should be pace of 3 miles/hours, or walking 1.5 miles in 30 minutes -recommend avoidance of tobacco products. Avoid excess alcohol.  Dispo: 1 year or sooner as needed  Signed, Jodelle Red, MD   Jodelle Red, MD, PhD, [facility]  [facility]  [place]  [facility] at [facility] at  [facility] [address] [place], [place] [address] [phone]

## 2023-01-21 NOTE — Patient Instructions (Signed)
Medication Instructions:  Your physician recommends that you continue on your current medications as directed. Please refer to the Current Medication list given to you today.  *If you need a refill on your cardiac medications before your next appointment, please call your pharmacy*  Lab Work: NONE  Testing/Procedures: NONE  Follow-Up: At [facility], you and your health needs are our priority.  As part of our continuing mission to provide you with exceptional heart care, we have created designated Provider Care Teams.  These Care Teams include your primary Cardiologist (physician) and Advanced Practice Providers (APPs -  Physician Assistants and Nurse Practitioners) who all work together to provide you with the care you need, when you need it.  We recommend signing up for the patient portal called "MyChart".  Sign up information is provided on this After Visit Summary.  MyChart is used to connect with patients for Virtual Visits (Telemedicine).  Patients are able to view lab/test results, encounter notes, upcoming appointments, etc.  Non-urgent messages can be sent to your provider as well.   To learn more about what you can do with MyChart, go to [email or web address].com.    Your next appointment:   12 month(s)  The format for your next appointment:   In Person  Provider:   Bridgette Christopher, MD     

## 2023-01-21 NOTE — Telephone Encounter (Signed)
PA approved. Effective 08/12/22 to 08/12/23 

## 2023-01-23 ENCOUNTER — Ambulatory Visit (HOSPITAL_BASED_OUTPATIENT_CLINIC_OR_DEPARTMENT_OTHER)
Admission: RE | Admit: 2023-01-23 | Discharge: 2023-01-23 | Disposition: A | Discharge status: Home / Self Care | Payer: Medicare HMO | Source: Ambulatory Visit | Attending: Family Medicine | Admitting: Family Medicine

## 2023-01-23 DIAGNOSIS — E041 Nontoxic single thyroid nodule: Secondary | ICD-10-CM | POA: Diagnosis not present

## 2023-01-25 ENCOUNTER — Encounter: Payer: Self-pay | Admitting: Family Medicine

## 2023-01-25 ENCOUNTER — Other Ambulatory Visit: Payer: Self-pay | Admitting: Family Medicine

## 2023-01-25 DIAGNOSIS — E042 Nontoxic multinodular goiter: Secondary | ICD-10-CM

## 2023-01-27 ENCOUNTER — Other Ambulatory Visit: Payer: Self-pay | Admitting: Family Medicine

## 2023-01-27 ENCOUNTER — Telehealth: Payer: Self-pay

## 2023-01-27 ENCOUNTER — Telehealth: Payer: Self-pay | Admitting: Family Medicine

## 2023-01-27 DIAGNOSIS — E119 Type 2 diabetes mellitus without complications: Secondary | ICD-10-CM

## 2023-01-27 MED ORDER — DOXYCYCLINE HYCLATE 50 MG PO CAPS: 50.0000 mg | ORAL_CAPSULE | Freq: Two times a day (BID) | ORAL | 1 refills | Status: DC

## 2023-01-27 MED ORDER — DOXYCYCLINE HYCLATE 50 MG PO CAPS
50.0000 mg | ORAL_CAPSULE | Freq: Every day | ORAL | 1 refills | Status: DC
Start: 2023-01-27 — End: 2023-04-02

## 2023-01-27 NOTE — Telephone Encounter (Signed)
Pa created for [facility]. Randy Orr (Key: [id number])

## 2023-01-27 NOTE — Telephone Encounter (Signed)
Patient called and states [facility] can't approve [facility] does not want to approve needs provider to call and let them know it is being used for type two diabetes rather than for weightloss.

## 2023-01-27 NOTE — Addendum Note (Signed)
Addended by: Abbe Amsterdam C on: 01/27/2023 09:48 AM   Modules accepted: Orders

## 2023-01-27 NOTE — Telephone Encounter (Signed)
PA started for the Rx.

## 2023-01-28 ENCOUNTER — Encounter: Payer: Self-pay | Admitting: Family Medicine

## 2023-01-28 ENCOUNTER — Other Ambulatory Visit: Payer: Medicare HMO | Admitting: Pharmacist

## 2023-01-28 ENCOUNTER — Other Ambulatory Visit: Payer: Self-pay | Admitting: Family Medicine

## 2023-01-28 DIAGNOSIS — E119 Type 2 diabetes mellitus without complications: Secondary | ICD-10-CM

## 2023-01-28 DIAGNOSIS — E782 Mixed hyperlipidemia: Secondary | ICD-10-CM

## 2023-01-28 DIAGNOSIS — T466X5A Adverse effect of antihyperlipidemic and antiarteriosclerotic drugs, initial encounter: Secondary | ICD-10-CM

## 2023-01-28 NOTE — Telephone Encounter (Signed)
PA approved. Effective 08/12/22 to 08/12/23 

## 2023-01-28 NOTE — Progress Notes (Signed)
Pharmacy Note  01/28/2023 Name: Randy Orr MRN: [id number] DOB: 02-May-1951  Subjective: Randy Orr is a 72 y.o. year old male who is a primary care patient of Copland, Gwenlyn Found, MD. Clinical Pharmacist Practitioner referral was placed to assist with medication management, diabetes and hyperlipidemia.    Engaged with patient by telephone for follow up visit today.   Medication management:  Reviewed meds and updated med list.    Type 2 DM: Patient stopped Farxiga to 10mg  daily due to A1c remained high and cost. He is in Medicare coverage gap. He has started [facility] 2.5mg  weekly and has taken for the last 3 months. Cost of Greggory Keen is about $200/month. Patient recently had to get prior authorization updated due to still taking 2.5mg . Patient reports he has lost weight and that [facility] 2.5mg  is lowering his blood glucose as well as helping to lower appetite.   He is follow ingDr Katherina Right program / book to reverse type 2 DM.   Hyperlipidemia:  Intolerant to several statins. Even tried to take CoEnzyme Q10 to see if helped with muscle pain but did not help.  He has taken rosuvastatin 5mg  daily and every 3 days - had leg cramps, myalgias and felt horrible. (07/04/2019 to 11/04/2019) Tried pravastatin 40mg  09/23/2011 to 09/02/2012 - stopped due to leg cramps / myalgias.  Tried atorvastatin - dose unknown - stopped due to myopathy.  Last LDL was above goal. He is taking Fish Oil and Cholestoff.  Coronary Calcium Score was read after our last visit and noted to be elevated.  Current therapy: Repatha and ezetimibe 10mg  daily   CT of chest 06/06/2022 showed an aneurysm.   Objective: Review of patient status, including review of consultants reports, laboratory and other test data, was performed as part of comprehensive.  Lab Results  Component Value Date   CREATININE 1.20 01/14/2023   CREATININE 1.11 12/04/2022   CREATININE 1.00 06/05/2022    Lab Results  Component Value  Date   HGBA1C 8.4 (H) 12/04/2022       Component Value Date/Time   CHOL 81 12/04/2022 0838   TRIG 99.0 12/04/2022 0838   HDL 51.20 12/04/2022 0838   CHOLHDL 2 12/04/2022 0838   VLDL 19.8 12/04/2022 0838   LDLCALC 10 12/04/2022 0838   LDLDIRECT 146.0 07/01/2019 0904     Clinical ASCVD: No  but coronary calcium score pending The ASCVD Risk score (Arnett DK, et al., 2019) failed to calculate for the following reasons:   The valid total cholesterol range is 130 to 320 mg/dL    BP Readings from Last 3 Encounters:  01/21/23 127/80  12/04/22 118/70  08/02/22 120/80     Allergies  Allergen Reactions   Atorvastatin Calcium Other (See Comments)    MYALGIAS   Pravastatin Sodium Other (See Comments)    LEG CRAMPS   Rosuvastatin Calcium Other (See Comments)    MYALGIA   Statins Other (See Comments)    We have tried several, cannot tolerate  - myalgias    Medications Reviewed Today     Reviewed by Henrene Pastor, RPH-CPP (Pharmacist) on 01/28/23 at 1528  Med List Status: <None>   Medication Order Taking? Sig Documenting Provider Last Dose Status Informant  ALPRAZolam (XANAX) 0.5 MG tablet [id number] Yes Take 0.5-1 tablets (0.25-0.5 mg total) by mouth 2 (two) times daily as needed for anxiety. Copland, Gwenlyn Found, MD Taking Active   Brimonidine Tartrate 0.33 % GEL [id number] Yes Apply 1 Application topically daily. Use as  needed for rosacea rash Copland, Gwenlyn Found, MD Taking Active   cyclobenzaprine (FLEXERIL) 10 MG tablet [id number] Yes Take 1 tablet (10 mg total) by mouth daily as needed for muscle spasms. Copland, Gwenlyn Found, MD Taking Active   doxycycline (VIBRAMYCIN) 50 MG capsule [id number] Yes Take 1 capsule (50 mg total) by mouth daily. Take one daily for rosacea Copland, Gwenlyn Found, MD Taking Active   Evolocumab [facility] SURECLICK) 140 MG/ML SOAJ [id number] Yes Inject 140 mg into the skin every 14 (fourteen) days. Jodelle Red, MD Taking Active   ezetimibe (ZETIA) 10 MG  tablet [id number] Yes Take 1 tablet (10 mg total) by mouth daily. Copland, Gwenlyn Found, MD Taking Active   l-methylfolate-B6-B12 [facility]) 3-35-2 MG TABS [id number] Yes Take 1 tablet by mouth daily. Dohmeier, Porfirio Mylar, MD Taking Active            Med Note Elberta Fortis Nov 01, 2019 11:53 AM) Eugene Garnet  metFORMIN (GLUCOPHAGE-XR) 500 MG 24 hr tablet [id number] Yes TAKE 2 TABLETS BY MOUTH EVERY DAY WITH BREAKFAST Copland, Gwenlyn Found, MD Taking Active   Multiple Vitamin (MULTIVITAMIN) tablet [id number] Yes Take 1 tablet by mouth daily. [provider] Taking Active            Med Note Elberta Fortis Nov 01, 2019 11:55 AM) Synthia Innocent Silver  Multiple Vitamins-Minerals (PRESERVISION AREDS 2 PO) [id number] Yes Take 1 tablet by mouth in the morning and at bedtime. [provider] Taking Active   Needle, Disp, (HYPODERMIC NEEDLE 23GX1") 23G X 1" MISC [id number] Yes Use to give IM injection as directed Copland, Gwenlyn Found, MD Taking Active   Plant Sterol Stanol-Pantethine (CHOLESTOFF COMPLETE PO) [id number] Yes Take 1 capsule by mouth daily. [provider] Taking Active   sildenafil (VIAGRA) 50 MG tablet [id number] Yes Take 50 mg by mouth as needed for erectile dysfunction. [provider] Taking Active   testosterone cypionate (DEPOTESTOSTERONE CYPIONATE) 200 MG/ML injection [id number] Yes INJECT 1 ML(200MG ) INTRAMUSCULARLY EVERY 14 DAYS. Copland, Gwenlyn Found, MD Taking Active   tirzepatide [facility]) 2.5 MG/0.5ML Pen [id number] Yes INJECT 2.5 MG SUBCUTANEOUSLY WEEKLY Copland, Gwenlyn Found, MD Taking Active   traZODone (DESYREL) 50 MG tablet [id number] Yes TAKE 1/2 TO 1 TABLET BY MOUTH AT BEDTIME AS NEEDED FOR SLEEP Copland, Gwenlyn Found, MD Taking Active             Patient Active Problem List   Diagnosis Date Noted   Thoracic aortic aneurysm (HCC) 06/10/2022   Acute pain of right knee 10/16/2021   Statin myopathy 09/27/2020   Left knee pain 06/06/2016   Peripheral sensory  neuropathy due to type 2 diabetes mellitus (HCC) 05/17/2014   Snoring 05/17/2014   Obesity 05/17/2014   Diabetes mellitus (HCC) 09/23/2011   Hypogonadism male 09/23/2011   Hyperlipidemia 09/23/2011     Medication Assistance:   Screened for patient assistance program for [place] or Ozempic in the past. Household income did not qualify for either [place] or Ozempic medication assistance program.   Assisted patient in applying for [employer]cholester[employer] Fund to help with cost of Repatha. Patient was approved 08/13/2022 thru 08/13/2023 for $2500   Assessment / Plan: Type 2 DM: A1c not at goal Continue Mounjaro 2.5mg  weekly Encouraged plant based diet and increase exercise.  Per Dr Elmyra Ricks recommendations try Dr Katherina Right program / book to reverse type 2 DM.  Recommend rechecking A1c at end of July to assess [facility] efficacy - is A1c still not at goal of <  7.0% then consider titrating dose.   Hyperlipidemia with elevated CAC: At goal of < 70. Intolerant to several statins.  Continue Repatha.  Could consider stopping ezetimibe since LDL was 10 at last check.   Medication management:  Reviewed and updated medication list Reviewed refill history and adherence  Follow Up:  Telephone follow up appointment with care management team member scheduled for:  3 to 6 months.     Henrene Pastor, PharmD Clinical Pharmacist [facility]  - [facility] [phone]

## 2023-03-06 ENCOUNTER — Other Ambulatory Visit: Payer: Medicare HMO

## 2023-03-10 ENCOUNTER — Other Ambulatory Visit (INDEPENDENT_AMBULATORY_CARE_PROVIDER_SITE_OTHER): Payer: Medicare HMO

## 2023-03-10 ENCOUNTER — Encounter: Payer: Self-pay | Admitting: Family Medicine

## 2023-03-10 DIAGNOSIS — E119 Type 2 diabetes mellitus without complications: Secondary | ICD-10-CM

## 2023-03-10 LAB — HEMOGLOBIN A1C: Hgb A1c MFr Bld: 7.1 % — ABNORMAL HIGH (ref 4.6–6.5)

## 2023-03-17 ENCOUNTER — Ambulatory Visit
Admission: RE | Admit: 2023-03-17 | Discharge: 2023-03-17 | Disposition: A | Discharge status: Home / Self Care | Payer: Medicare HMO | Source: Ambulatory Visit | Attending: Family Medicine | Admitting: Family Medicine

## 2023-03-17 ENCOUNTER — Other Ambulatory Visit (HOSPITAL_COMMUNITY)
Admission: RE | Admit: 2023-03-17 | Discharge: 2023-03-17 | Disposition: A | Discharge status: Home / Self Care | Payer: Medicare HMO | Source: Ambulatory Visit | Attending: Interventional Radiology | Admitting: Interventional Radiology

## 2023-03-17 DIAGNOSIS — E042 Nontoxic multinodular goiter: Secondary | ICD-10-CM

## 2023-03-17 DIAGNOSIS — E0789 Other specified disorders of thyroid: Secondary | ICD-10-CM | POA: Diagnosis not present

## 2023-03-17 DIAGNOSIS — E041 Nontoxic single thyroid nodule: Secondary | ICD-10-CM | POA: Diagnosis not present

## 2023-03-19 ENCOUNTER — Encounter: Payer: Self-pay | Admitting: Family Medicine

## 2023-04-01 ENCOUNTER — Telehealth: Payer: Self-pay

## 2023-04-01 NOTE — Telephone Encounter (Signed)
Called pt and left message to come at 1:30 PM 04/02/23 for appt.

## 2023-04-02 ENCOUNTER — Encounter: Payer: Self-pay | Admitting: Pulmonary Disease

## 2023-04-02 ENCOUNTER — Ambulatory Visit: Payer: Medicare HMO | Admitting: Pulmonary Disease

## 2023-04-02 VITALS — BP 130/80 | HR 74 | Ht 75.0 in | Wt 215.8 lb

## 2023-04-02 DIAGNOSIS — R911 Solitary pulmonary nodule: Secondary | ICD-10-CM

## 2023-04-02 DIAGNOSIS — Z87891 Personal history of nicotine dependence: Secondary | ICD-10-CM

## 2023-04-02 NOTE — Patient Instructions (Signed)
Thank you for visiting Dr. Tonia Brooms at [facility]. Today we recommend the following:  Orders Placed This Encounter  Procedures   NM PET Image Initial (PI) Skull Base To Thigh (F-18 FDG)   Please schedule appt with [place] after PET CT complete   Return in about 4 weeks (around 04/30/2023) for with Kandice Robinsons, NP, or Dr. Tonia Brooms, after PET/CT Chest.    Please do your part to reduce the spread of COVID-19.

## 2023-04-02 NOTE — Progress Notes (Signed)
Synopsis: Referred in August 2024 for pulmonary nodule by Copland, Gwenlyn Found, MD  Subjective:   PATIENT ID: Randy Orr GENDER: male DOB: 1951/02/28, MRN: [id number]  Chief Complaint  Patient presents with   Consult    Consult lung nodule.    This is a 72 year old gentleman, history of diabetes hyperlipidemia obesity.  He had a coronary calcium scoring CT which found a incidental pulmonary nodule which led to CT imaging of the chest and thyroid nodules being found was referred for thyroid evaluation.  Pulmonary nodule was 1.4 cm smooth within the posterior segment of the right upper lobe.  Patient was referred for next best steps in evaluation.    Past Medical History:  Diagnosis Date   Diabetes mellitus without complication (HCC)    DM type 2   GERD (gastroesophageal reflux disease)    Hyperlipidemia    Low testosterone    Obesity 05/17/2014   Peripheral sensory neuropathy due to type 2 diabetes mellitus (HCC) 05/17/2014   Snoring 05/17/2014     Family History  Problem Relation Age of Onset   Heart attack Father    Diabetes Brother    Atrial fibrillation Brother    Colon cancer Neg Hx    Esophageal cancer Neg Hx    Rectal cancer Neg Hx    Stomach cancer Neg Hx      Past Surgical History:  Procedure Laterality Date   COLONOSCOPY  07/18/2014    Social History   Socioeconomic History   Marital status: Married    Spouse name: Not on file   Number of children: 1   Years of education: AAS   Highest education level: Bachelor's degree (e.g., BA, AB, BS)  Occupational History   Occupation: SECURITY OFFICER    Employer: [employer]  Tobacco Use   Smoking status: Former   Smokeless tobacco: Never   Tobacco comments:    smoked 30 years ago.  Substance and Sexual Activity   Alcohol use: Yes    Alcohol/week: 5.0 standard drinks of alcohol    Types: 5 Glasses of wine per week   Drug use: No   Sexual activity: Not on file  Other Topics Concern   Not on file   Social History Narrative   Not on file   Social Determinants of Health   Financial Resource Strain: Patient Declined (11/27/2022)   Overall Financial Resource Strain (CARDIA)    Difficulty of Paying Living Expenses: Patient declined  Food Insecurity: Patient Declined (11/27/2022)   Hunger Vital Sign    Worried About Running Out of Food in the Last Year: Patient declined    Ran Out of Food in the Last Year: Patient declined  Transportation Needs: Unknown (11/27/2022)   PRAPARE - Administrator, Civil Service (Medical): Not on file    Lack of Transportation (Non-Medical): No  Physical Activity: Insufficiently Active (11/27/2022)   Exercise Vital Sign    Days of Exercise per Week: 4 days    Minutes of Exercise per Session: 30 min  Stress: Stress Concern Present (11/27/2022)   [employer] of Occupational Health - Occupational Stress Questionnaire    Feeling of Stress : To some extent  Social Connections: Moderately Integrated (11/27/2022)   Social Connection and Isolation Panel [NHANES]    Frequency of Communication with Friends and Family: More than three times a week    Frequency of Social Gatherings with Friends and Family: Twice a week    Attends Religious Services: Never  Active Member of Clubs or Organizations: Yes    Attends Banker Meetings: More than 4 times per year    Marital Status: Married  Catering manager Violence: Not At Risk (06/20/2021)   Humiliation, Afraid, Rape, and Kick questionnaire    Fear of Current or Ex-Partner: No    Emotionally Abused: No    Physically Abused: No    Sexually Abused: No     Allergies  Allergen Reactions   Atorvastatin Calcium Other (See Comments)    MYALGIAS   Pravastatin Sodium Other (See Comments)    LEG CRAMPS   Rosuvastatin Calcium Other (See Comments)    MYALGIA   Statins Other (See Comments)    We have tried several, cannot tolerate  - myalgias     Outpatient Medications Prior to Visit   Medication Sig Dispense Refill   ALPRAZolam (XANAX) 0.5 MG tablet Take 0.5-1 tablets (0.25-0.5 mg total) by mouth 2 (two) times daily as needed for anxiety. 20 tablet 1   cyclobenzaprine (FLEXERIL) 10 MG tablet Take 1 tablet (10 mg total) by mouth daily as needed for muscle spasms. 30 tablet 0   Evolocumab (REPATHA SURECLICK) 140 MG/ML SOAJ Inject 140 mg into the skin every 14 (fourteen) days. 2 mL 12   ezetimibe (ZETIA) 10 MG tablet Take 1 tablet (10 mg total) by mouth daily. 90 tablet 3   l-methylfolate-B6-B12 (METANX) 3-35-2 MG TABS Take 1 tablet by mouth daily. 60 tablet 1   metFORMIN (GLUCOPHAGE-XR) 500 MG 24 hr tablet TAKE 2 TABLETS BY MOUTH EVERY DAY WITH BREAKFAST 180 tablet 3   Multiple Vitamin (MULTIVITAMIN) tablet Take 1 tablet by mouth daily.     Multiple Vitamins-Minerals (PRESERVISION AREDS 2 PO) Take 1 tablet by mouth in the morning and at bedtime.     Needle, Disp, (HYPODERMIC NEEDLE 23GX1") 23G X 1" MISC Use to give IM injection as directed 50 each 1   sildenafil (VIAGRA) 50 MG tablet Take 50 mg by mouth as needed for erectile dysfunction.     testosterone cypionate (DEPOTESTOSTERONE CYPIONATE) 200 MG/ML injection INJECT 1 ML(200MG ) INTRAMUSCULARLY EVERY 14 DAYS. 6 mL 1   tirzepatide (MOUNJARO) 2.5 MG/0.5ML Pen INJECT 2.5 MG SUBCUTANEOUSLY WEEKLY 2 mL 2   traZODone (DESYREL) 50 MG tablet TAKE 1/2 TO 1 TABLET BY MOUTH AT BEDTIME AS NEEDED FOR SLEEP 90 tablet 1   Brimonidine Tartrate 0.33 % GEL Apply 1 Application topically daily. Use as needed for rosacea rash 30 g 1   doxycycline (VIBRAMYCIN) 50 MG capsule Take 1 capsule (50 mg total) by mouth daily. Take one daily for rosacea 90 capsule 1   Plant Sterol Stanol-Pantethine (CHOLESTOFF COMPLETE PO) Take 1 capsule by mouth daily.     No facility-administered medications prior to visit.    Review of Systems  Constitutional:  Negative for chills, fever, malaise/fatigue and weight loss.  HENT:  Negative for hearing loss, sore  throat and tinnitus.   Eyes:  Negative for blurred vision and double vision.  Respiratory:  Negative for cough, hemoptysis, sputum production, shortness of breath, wheezing and stridor.   Cardiovascular:  Negative for chest pain, palpitations, orthopnea, leg swelling and PND.  Gastrointestinal:  Negative for abdominal pain, constipation, diarrhea, heartburn, nausea and vomiting.  Genitourinary:  Negative for dysuria, hematuria and urgency.  Musculoskeletal:  Negative for joint pain and myalgias.  Skin:  Negative for itching and rash.  Neurological:  Negative for dizziness, tingling, weakness and headaches.  Endo/Heme/Allergies:  Negative for environmental allergies. Does not  bruise/bleed easily.  Psychiatric/Behavioral:  Negative for depression. The patient is not nervous/anxious and does not have insomnia.   All other systems reviewed and are negative.    Objective:  Physical Exam Vitals reviewed.  Constitutional:      General: He is not in acute distress.    Appearance: He is well-developed.  HENT:     Head: Normocephalic and atraumatic.  Eyes:     General: No scleral icterus.    Conjunctiva/sclera: Conjunctivae normal.     Pupils: Pupils are equal, round, and reactive to light.  Neck:     Vascular: No JVD.     Trachea: No tracheal deviation.  Cardiovascular:     Rate and Rhythm: Normal rate and regular rhythm.     Heart sounds: Normal heart sounds. No murmur heard. Pulmonary:     Effort: Pulmonary effort is normal. No tachypnea, accessory muscle usage or respiratory distress.     Breath sounds: No stridor. No wheezing, rhonchi or rales.  Abdominal:     General: There is no distension.     Palpations: Abdomen is soft.     Tenderness: There is no abdominal tenderness.  Musculoskeletal:        General: No tenderness.     Cervical back: Neck supple.  Lymphadenopathy:     Cervical: No cervical adenopathy.  Skin:    General: Skin is warm and dry.     Capillary Refill:  Capillary refill takes less than 2 seconds.     Findings: No rash.  Neurological:     Mental Status: He is alert and oriented to person, place, and time.  Psychiatric:        Behavior: Behavior normal.      Vitals:   04/02/23 1309  BP: 130/80  Pulse: 74  SpO2: 98%  Weight: 215 lb 12.8 oz (97.9 kg)  Height: 6\' 3"  (1.905 m)   98% on RA BMI Readings from Last 3 Encounters:  04/02/23 26.97 kg/m  01/21/23 26.87 kg/m  12/04/22 27.57 kg/m   Wt Readings from Last 3 Encounters:  04/02/23 215 lb 12.8 oz (97.9 kg)  01/21/23 215 lb (97.5 kg)  12/04/22 220 lb 9.6 oz (100.1 kg)     CBC    Component Value Date/Time   WBC 6.5 12/04/2022 0838   RBC 5.31 12/04/2022 0838   HGB 17.1 (H) 12/04/2022 0838   HCT 49.6 12/04/2022 0838   PLT 233.0 12/04/2022 0838   MCV 93.4 12/04/2022 0838   MCV 90.7 03/01/2015 0931   MCH 30.7 03/01/2015 0931   MCH 31.3 05/16/2014 0845   MCHC 34.6 12/04/2022 0838   RDW 13.1 12/04/2022 0838     Chest Imaging: CT chest June 2024: 1.4 cm smooth nodule within the upper lobe. The patient's images have been independently reviewed by me.    Pulmonary Functions Testing Results:     No data to display          FeNO:   Pathology:   Echocardiogram:   Heart Catheterization:     Assessment & Plan:     ICD-10-CM   1. Lung nodule  R91.1 NM PET Image Initial (PI) Skull Base To Thigh (F-18 FDG)    2. Former smoker  Z87.891       Discussion: This is a 72 year old gentleman, found to have a incidental pulmonary nodule measuring 1.4 cm smooth margins, former smoker.  Plan: Due to the patient's nodule size I think best neck step would be a nuclear medicine PET  scan. I have ordered the PET scan to be complete. He will follow-up and see [place] in clinic after the PET is done. Then we will determine next best steps.  If it looks like a malignancy we may be a good candidate for surgical resection and/or consideration for a single anesthetic  event. The margins are smooth would suggest a potentially a indolent malignancy such as a carcinoid tumor is potentially more likely in his case.  RTC 4 weeks with SG, NP or me after PET complete.   Current Outpatient Medications:    ALPRAZolam (XANAX) 0.5 MG tablet, Take 0.5-1 tablets (0.25-0.5 mg total) by mouth 2 (two) times daily as needed for anxiety., Disp: 20 tablet, Rfl: 1   cyclobenzaprine (FLEXERIL) 10 MG tablet, Take 1 tablet (10 mg total) by mouth daily as needed for muscle spasms., Disp: 30 tablet, Rfl: 0   Evolocumab (REPATHA SURECLICK) 140 MG/ML SOAJ, Inject 140 mg into the skin every 14 (fourteen) days., Disp: 2 mL, Rfl: 12   ezetimibe (ZETIA) 10 MG tablet, Take 1 tablet (10 mg total) by mouth daily., Disp: 90 tablet, Rfl: 3   l-methylfolate-B6-B12 (METANX) 3-35-2 MG TABS, Take 1 tablet by mouth daily., Disp: 60 tablet, Rfl: 1   metFORMIN (GLUCOPHAGE-XR) 500 MG 24 hr tablet, TAKE 2 TABLETS BY MOUTH EVERY DAY WITH BREAKFAST, Disp: 180 tablet, Rfl: 3   Multiple Vitamin (MULTIVITAMIN) tablet, Take 1 tablet by mouth daily., Disp: , Rfl:    Multiple Vitamins-Minerals (PRESERVISION AREDS 2 PO), Take 1 tablet by mouth in the morning and at bedtime., Disp: , Rfl:    Needle, Disp, (HYPODERMIC NEEDLE 23GX1") 23G X 1" MISC, Use to give IM injection as directed, Disp: 50 each, Rfl: 1   sildenafil (VIAGRA) 50 MG tablet, Take 50 mg by mouth as needed for erectile dysfunction., Disp: , Rfl:    testosterone cypionate (DEPOTESTOSTERONE CYPIONATE) 200 MG/ML injection, INJECT 1 ML(200MG ) INTRAMUSCULARLY EVERY 14 DAYS., Disp: 6 mL, Rfl: 1   tirzepatide (MOUNJARO) 2.5 MG/0.5ML Pen, INJECT 2.5 MG SUBCUTANEOUSLY WEEKLY, Disp: 2 mL, Rfl: 2   traZODone (DESYREL) 50 MG tablet, TAKE 1/2 TO 1 TABLET BY MOUTH AT BEDTIME AS NEEDED FOR SLEEP, Disp: 90 tablet, Rfl: 1   Josephine Igo, DO [facility] 04/02/2023 2:34 PM

## 2023-04-04 ENCOUNTER — Encounter: Payer: Self-pay | Admitting: Pulmonary Disease

## 2023-04-07 ENCOUNTER — Encounter (HOSPITAL_COMMUNITY)
Admission: RE | Admit: 2023-04-07 | Discharge: 2023-04-07 | Disposition: A | Discharge status: Home / Self Care | Payer: Medicare HMO | Source: Ambulatory Visit | Attending: Pulmonary Disease | Admitting: Pulmonary Disease

## 2023-04-07 DIAGNOSIS — R911 Solitary pulmonary nodule: Secondary | ICD-10-CM

## 2023-04-07 LAB — GLUCOSE, CAPILLARY: Glucose-Capillary: 129 mg/dL — ABNORMAL HIGH (ref 70–99)

## 2023-04-07 MED ORDER — FLUDEOXYGLUCOSE F - 18 (FDG) INJECTION
10.8000 | Freq: Once | INTRAVENOUS | Status: AC
  Administered 2023-04-07: 10.8 via INTRAVENOUS

## 2023-04-08 MED ORDER — TIRZEPATIDE 5 MG/0.5ML ~~LOC~~ SOPN
5.0000 mg | PEN_INJECTOR | SUBCUTANEOUS | 1 refills | Status: AC
Start: 2023-04-08 — End: ?

## 2023-04-08 NOTE — Addendum Note (Signed)
Addended by: Abbe Amsterdam C on: 04/08/2023 01:45 PM   Modules accepted: Orders

## 2023-04-12 ENCOUNTER — Other Ambulatory Visit: Payer: Self-pay | Admitting: Family Medicine

## 2023-04-12 DIAGNOSIS — E119 Type 2 diabetes mellitus without complications: Secondary | ICD-10-CM

## 2023-04-21 ENCOUNTER — Ambulatory Visit (INDEPENDENT_AMBULATORY_CARE_PROVIDER_SITE_OTHER): Payer: Medicare HMO | Admitting: Pulmonary Disease

## 2023-04-21 ENCOUNTER — Encounter: Payer: Self-pay | Admitting: Pulmonary Disease

## 2023-04-21 VITALS — BP 116/72 | HR 83 | Ht 75.0 in | Wt 214.2 lb

## 2023-04-21 DIAGNOSIS — R911 Solitary pulmonary nodule: Secondary | ICD-10-CM | POA: Diagnosis not present

## 2023-04-21 DIAGNOSIS — Z87891 Personal history of nicotine dependence: Secondary | ICD-10-CM

## 2023-04-21 NOTE — Patient Instructions (Signed)
Thank you for visiting Dr. Tonia Brooms at [facility]. Today we recommend the following: Orders Placed This Encounter  Procedures   CT Chest Wo Contrast   Return in about 6 months (around 10/19/2023) for with APP, after CT Chest.    Please do your part to reduce the spread of COVID-19.

## 2023-04-21 NOTE — Progress Notes (Signed)
Synopsis: Referred in August 2024 for pulmonary nodule by Copland, Gwenlyn Found, MD  Subjective:   PATIENT ID: Randy Orr GENDER: male DOB: 02-15-1951, MRN: [id number]  Chief Complaint  Patient presents with   Follow-up    F/up on PET scan    This is a 72 year old gentleman, history of diabetes hyperlipidemia obesity.  He had a coronary calcium scoring CT which found a incidental pulmonary nodule which led to CT imaging of the chest and thyroid nodules being found was referred for thyroid evaluation.  Pulmonary nodule was 1.4 cm smooth within the posterior segment of the right upper lobe.  Patient was referred for next best steps in evaluation.  OV 04/21/2023: Here today for follow-up patient had nuclear medicine PET scan on 04/07/2023.  Patient was found to have a 1.1 cm posterior right upper lobe nodule not significantly hypermetabolic but does favor morphology concerning for malignancy.  However is not diagnostic and would recommend close follow-up.    Past Medical History:  Diagnosis Date   Diabetes mellitus without complication (HCC)    DM type 2   GERD (gastroesophageal reflux disease)    Hyperlipidemia    Low testosterone    Obesity 05/17/2014   Peripheral sensory neuropathy due to type 2 diabetes mellitus (HCC) 05/17/2014   Snoring 05/17/2014     Family History  Problem Relation Age of Onset   Heart attack Father    Diabetes Brother    Atrial fibrillation Brother    Colon cancer Neg Hx    Esophageal cancer Neg Hx    Rectal cancer Neg Hx    Stomach cancer Neg Hx      Past Surgical History:  Procedure Laterality Date   COLONOSCOPY  07/18/2014    Social History   Socioeconomic History   Marital status: Married    Spouse name: Not on file   Number of children: 1   Years of education: AAS   Highest education level: Bachelor's degree (e.g., BA, AB, BS)  Occupational History   Occupation: SECURITY OFFICER    Employer: [employer]  Tobacco Use   Smoking status:  Former   Smokeless tobacco: Never   Tobacco comments:    smoked 30 years ago.  Substance and Sexual Activity   Alcohol use: Yes    Alcohol/week: 5.0 standard drinks of alcohol    Types: 5 Glasses of wine per week   Drug use: No   Sexual activity: Not on file  Other Topics Concern   Not on file  Social History Narrative   Not on file   Social Determinants of Health   Financial Resource Strain: Patient Declined (11/27/2022)   Overall Financial Resource Strain (CARDIA)    Difficulty of Paying Living Expenses: Patient declined  Food Insecurity: Patient Declined (11/27/2022)   Hunger Vital Sign    Worried About Running Out of Food in the Last Year: Patient declined    Ran Out of Food in the Last Year: Patient declined  Transportation Needs: Unknown (11/27/2022)   PRAPARE - Administrator, Civil Service (Medical): Not on file    Lack of Transportation (Non-Medical): No  Physical Activity: Insufficiently Active (11/27/2022)   Exercise Vital Sign    Days of Exercise per Week: 4 days    Minutes of Exercise per Session: 30 min  Stress: Stress Concern Present (11/27/2022)   [employer] of Occupational Health - Occupational Stress Questionnaire    Feeling of Stress : To some extent  Social Connections: Moderately  Integrated (11/27/2022)   Social Connection and Isolation Panel [NHANES]    Frequency of Communication with Friends and Family: More than three times a week    Frequency of Social Gatherings with Friends and Family: Twice a week    Attends Religious Services: Never    Database administrator or Organizations: Yes    Attends Engineer, structural: More than 4 times per year    Marital Status: Married  Catering manager Violence: Not At Risk (06/20/2021)   Humiliation, Afraid, Rape, and Kick questionnaire    Fear of Current or Ex-Partner: No    Emotionally Abused: No    Physically Abused: No    Sexually Abused: No     Allergies  Allergen Reactions    Atorvastatin Calcium Other (See Comments)    MYALGIAS   Pravastatin Sodium Other (See Comments)    LEG CRAMPS   Rosuvastatin Calcium Other (See Comments)    MYALGIA   Statins Other (See Comments)    We have tried several, cannot tolerate  - myalgias     Outpatient Medications Prior to Visit  Medication Sig Dispense Refill   ALPRAZolam (XANAX) 0.5 MG tablet Take 0.5-1 tablets (0.25-0.5 mg total) by mouth 2 (two) times daily as needed for anxiety. 20 tablet 1   cyclobenzaprine (FLEXERIL) 10 MG tablet Take 1 tablet (10 mg total) by mouth daily as needed for muscle spasms. 30 tablet 0   Evolocumab (REPATHA SURECLICK) 140 MG/ML SOAJ Inject 140 mg into the skin every 14 (fourteen) days. 2 mL 12   ezetimibe (ZETIA) 10 MG tablet Take 1 tablet (10 mg total) by mouth daily. 90 tablet 3   l-methylfolate-B6-B12 (METANX) 3-35-2 MG TABS Take 1 tablet by mouth daily. 60 tablet 1   metFORMIN (GLUCOPHAGE-XR) 500 MG 24 hr tablet TAKE 2 TABLETS BY MOUTH EVERY DAY WITH BREAKFAST 180 tablet 3   Multiple Vitamin (MULTIVITAMIN) tablet Take 1 tablet by mouth daily.     Multiple Vitamins-Minerals (PRESERVISION AREDS 2 PO) Take 1 tablet by mouth in the morning and at bedtime.     Needle, Disp, (HYPODERMIC NEEDLE 23GX1") 23G X 1" MISC Use to give IM injection as directed 50 each 1   sildenafil (VIAGRA) 50 MG tablet Take 50 mg by mouth as needed for erectile dysfunction.     testosterone cypionate (DEPOTESTOSTERONE CYPIONATE) 200 MG/ML injection INJECT 1 ML(200MG ) INTRAMUSCULARLY EVERY 14 DAYS. 6 mL 1   tirzepatide (MOUNJARO) 5 MG/0.5ML Pen Inject 5 mg into the skin once a week. 6 mL 1   traZODone (DESYREL) 50 MG tablet TAKE 1/2 TO 1 TABLET BY MOUTH AT BEDTIME AS NEEDED FOR SLEEP 90 tablet 1   No facility-administered medications prior to visit.    Review of Systems  Constitutional:  Negative for chills, fever, malaise/fatigue and weight loss.  HENT:  Negative for hearing loss, sore throat and tinnitus.   Eyes:   Negative for blurred vision and double vision.  Respiratory:  Negative for cough, hemoptysis, sputum production, shortness of breath, wheezing and stridor.   Cardiovascular:  Negative for chest pain, palpitations, orthopnea, leg swelling and PND.  Gastrointestinal:  Negative for abdominal pain, constipation, diarrhea, heartburn, nausea and vomiting.  Genitourinary:  Negative for dysuria, hematuria and urgency.  Musculoskeletal:  Negative for joint pain and myalgias.  Skin:  Negative for itching and rash.  Neurological:  Negative for dizziness, tingling, weakness and headaches.  Endo/Heme/Allergies:  Negative for environmental allergies. Does not bruise/bleed easily.  Psychiatric/Behavioral:  Negative for  depression. The patient is not nervous/anxious and does not have insomnia.   All other systems reviewed and are negative.    Objective:  Physical Exam Vitals reviewed.  Constitutional:      General: He is not in acute distress.    Appearance: He is well-developed.  HENT:     Head: Normocephalic and atraumatic.  Eyes:     General: No scleral icterus.    Conjunctiva/sclera: Conjunctivae normal.     Pupils: Pupils are equal, round, and reactive to light.  Neck:     Vascular: No JVD.     Trachea: No tracheal deviation.  Cardiovascular:     Rate and Rhythm: Normal rate and regular rhythm.     Heart sounds: Normal heart sounds. No murmur heard. Pulmonary:     Effort: Pulmonary effort is normal. No tachypnea, accessory muscle usage or respiratory distress.     Breath sounds: No stridor. No wheezing, rhonchi or rales.  Abdominal:     General: There is no distension.     Palpations: Abdomen is soft.     Tenderness: There is no abdominal tenderness.  Musculoskeletal:        General: No tenderness.     Cervical back: Neck supple.  Lymphadenopathy:     Cervical: No cervical adenopathy.  Skin:    General: Skin is warm and dry.     Capillary Refill: Capillary refill takes less than 2  seconds.     Findings: No rash.  Neurological:     Mental Status: He is alert and oriented to person, place, and time.  Psychiatric:        Behavior: Behavior normal.      Vitals:   04/21/23 1551  BP: 116/72  Pulse: 83  SpO2: 97%  Weight: 214 lb 3.2 oz (97.2 kg)  Height: 6\' 3"  (1.905 m)    97% on RA BMI Readings from Last 3 Encounters:  04/21/23 26.77 kg/m  04/02/23 26.97 kg/m  01/21/23 26.87 kg/m   Wt Readings from Last 3 Encounters:  04/21/23 214 lb 3.2 oz (97.2 kg)  04/02/23 215 lb 12.8 oz (97.9 kg)  01/21/23 215 lb (97.5 kg)     CBC    Component Value Date/Time   WBC 6.5 12/04/2022 0838   RBC 5.31 12/04/2022 0838   HGB 17.1 (H) 12/04/2022 0838   HCT 49.6 12/04/2022 0838   PLT 233.0 12/04/2022 0838   MCV 93.4 12/04/2022 0838   MCV 90.7 03/01/2015 0931   MCH 30.7 03/01/2015 0931   MCH 31.3 05/16/2014 0845   MCHC 34.6 12/04/2022 0838   RDW 13.1 12/04/2022 0838     Chest Imaging: CT chest June 2024: 1.4 cm smooth nodule within the upper lobe. The patient's images have been independently reviewed by me.    Pulmonary Functions Testing Results:     No data to display          FeNO:   Pathology:   Echocardiogram:   Heart Catheterization:     Assessment & Plan:     ICD-10-CM   1. Lung nodule  R91.1 CT Chest Wo Contrast    2. Former smoker  Z87.891        Discussion:  This is a 72 year old gentleman found to have incidental pulmonary nodule, 1.4 cm smooth margins and a former smoker.  Patient had nuclear medicine pet imaging with no significant uptake.  Plan: Repeat noncontrast CT chest in 6 months. Pending upon the results of this to see if it  changes gross we will continue to follow or consider bronchoscopy and biopsy. We could still be dealing with a indolent carcinoma such as a carcinoid tumor. Patient is okay with following this over the next 6 months. RTC after repeat CT complete.    Current Outpatient Medications:     ALPRAZolam (XANAX) 0.5 MG tablet, Take 0.5-1 tablets (0.25-0.5 mg total) by mouth 2 (two) times daily as needed for anxiety., Disp: 20 tablet, Rfl: 1   cyclobenzaprine (FLEXERIL) 10 MG tablet, Take 1 tablet (10 mg total) by mouth daily as needed for muscle spasms., Disp: 30 tablet, Rfl: 0   Evolocumab (REPATHA SURECLICK) 140 MG/ML SOAJ, Inject 140 mg into the skin every 14 (fourteen) days., Disp: 2 mL, Rfl: 12   ezetimibe (ZETIA) 10 MG tablet, Take 1 tablet (10 mg total) by mouth daily., Disp: 90 tablet, Rfl: 3   l-methylfolate-B6-B12 (METANX) 3-35-2 MG TABS, Take 1 tablet by mouth daily., Disp: 60 tablet, Rfl: 1   metFORMIN (GLUCOPHAGE-XR) 500 MG 24 hr tablet, TAKE 2 TABLETS BY MOUTH EVERY DAY WITH BREAKFAST, Disp: 180 tablet, Rfl: 3   Multiple Vitamin (MULTIVITAMIN) tablet, Take 1 tablet by mouth daily., Disp: , Rfl:    Multiple Vitamins-Minerals (PRESERVISION AREDS 2 PO), Take 1 tablet by mouth in the morning and at bedtime., Disp: , Rfl:    Needle, Disp, (HYPODERMIC NEEDLE 23GX1") 23G X 1" MISC, Use to give IM injection as directed, Disp: 50 each, Rfl: 1   sildenafil (VIAGRA) 50 MG tablet, Take 50 mg by mouth as needed for erectile dysfunction., Disp: , Rfl:    testosterone cypionate (DEPOTESTOSTERONE CYPIONATE) 200 MG/ML injection, INJECT 1 ML(200MG ) INTRAMUSCULARLY EVERY 14 DAYS., Disp: 6 mL, Rfl: 1   tirzepatide (MOUNJARO) 5 MG/0.5ML Pen, Inject 5 mg into the skin once a week., Disp: 6 mL, Rfl: 1   traZODone (DESYREL) 50 MG tablet, TAKE 1/2 TO 1 TABLET BY MOUTH AT BEDTIME AS NEEDED FOR SLEEP, Disp: 90 tablet, Rfl: 1   Josephine Igo, DO [facility] 04/21/2023 4:01 PM

## 2023-05-14 DIAGNOSIS — L281 Prurigo nodularis: Secondary | ICD-10-CM | POA: Diagnosis not present

## 2023-05-14 DIAGNOSIS — L719 Rosacea, unspecified: Secondary | ICD-10-CM | POA: Diagnosis not present

## 2023-05-22 ENCOUNTER — Other Ambulatory Visit: Payer: Self-pay | Admitting: Family Medicine

## 2023-05-22 DIAGNOSIS — F5102 Adjustment insomnia: Secondary | ICD-10-CM

## 2023-06-12 ENCOUNTER — Encounter: Payer: Self-pay | Admitting: Pharmacist

## 2023-06-12 ENCOUNTER — Encounter: Payer: Self-pay | Admitting: Family Medicine

## 2023-06-12 ENCOUNTER — Other Ambulatory Visit: Payer: Self-pay | Admitting: Family Medicine

## 2023-06-12 DIAGNOSIS — E291 Testicular hypofunction: Secondary | ICD-10-CM

## 2023-06-15 NOTE — Patient Instructions (Incomplete)
It was great to see again today, I will be in touch with your lab work Assuming all is well, please see me in about 6 months Tetanus is due next year- if any dirty wound please get an update, can go ahead and do booster at your pharmacy if you wish

## 2023-06-15 NOTE — Progress Notes (Unsigned)
Healthcare at [facility] [address], [address] [place], [place] [address] [phone]  Date:  06/18/2023   Name:  Randy Orr   DOB:  [birth year]   MRN:  [id number]  PCP:  Randy Cables, MD    Chief Complaint: No chief complaint on file.   History of Present Illness:  Randy Orr is a 72 y.o. very pleasant male patient who presents with the following:  Patient seen today for periodic follow-up Most recent visit with myself was in April  History of diabetes, peripheral neuropathy, hypogonadism on testosterone replacement, hyperlipidemia He is unable to tolerate statins-is now using Repatha as well as Zetia  He was seen by pulmonology, Dr. Tonia Brooms in September; previous CT coronary calcium turned up a concerning appearing incidental pulmonary nodule However PET scan was negative.  They plan to follow-up in another 6 months  We also found a thyroid nodule in the last 6 months, which required biopsy.  This came back benign, Bethesda category 2 We are still monitoring 1 other nodule with annual ultrasound  Seen by cardiology, Dr. Cristal Deer in June: Nonobstructive CAD Mixed hyperlipidemia Statin myalgia Type II diabetes -Calcium score 206 -no symptoms -doing well on PCSK9i -doing well on Mounjaro low dose. Last A1c 8.4.  -reviewed red flag warning signs that need immediate medical attention Thoracic aortic aneurysm -reviewed CT angio today. Incidental findings of lung nodule and thyroid nodule. Will forward on to Dr. Patsy Lager. -sinuses of Valsalva enlarge but rest of aorta is not significantly dilated.  -follow in 1 year if not seen sooner on other imaging. CV risk counseling and prevention -recommend heart healthy/Mediterranean diet, with whole grains, fruits, vegetable, fish, lean meats, nuts, and olive oil. Limit salt. -recommend moderate walking, 3-5 times/week for 30-50 minutes each session. Aim for at least 150  minutes.week. Goal should be pace of 3 miles/hours, or walking 1.5 miles in 30 minutes -recommend avoidance of tobacco products. Avoid excess alcohol.   Lab Results  Component Value Date   HGBA1C 7.1 (H) 03/10/2023   Foot exam due for update Flu shot COVID booster RSV, Shingrix, pneumonia already done Tetanus due next year  Recent labs-April he had BMP, lipid, A1c, testosterone, PSA Patient Active Problem List   Diagnosis Date Noted   Thoracic aortic aneurysm (HCC) 06/10/2022   Acute pain of right knee 10/16/2021   Statin myopathy 09/27/2020   Left knee pain 06/06/2016   Peripheral sensory neuropathy due to type 2 diabetes mellitus (HCC) 05/17/2014   Snoring 05/17/2014   Obesity 05/17/2014   Diabetes mellitus (HCC) 09/23/2011   Hypogonadism male 09/23/2011   Hyperlipidemia 09/23/2011    Past Medical History:  Diagnosis Date   Diabetes mellitus without complication (HCC)    DM type 2   GERD (gastroesophageal reflux disease)    Hyperlipidemia    Low testosterone    Obesity 05/17/2014   Peripheral sensory neuropathy due to type 2 diabetes mellitus (HCC) 05/17/2014   Snoring 05/17/2014    Past Surgical History:  Procedure Laterality Date   COLONOSCOPY  07/18/2014    Social History   Tobacco Use   Smoking status: Former   Smokeless tobacco: Never   Tobacco comments:    smoked 30 years ago.  Substance Use Topics   Alcohol use: Yes    Alcohol/week: 5.0 standard drinks of alcohol    Types: 5 Glasses of wine per week   Drug use: No    Family History  Problem Relation Age of Onset   Heart attack Father    Diabetes Brother    Atrial fibrillation Brother    Colon cancer Neg Hx    Esophageal cancer Neg Hx    Rectal cancer Neg Hx    Stomach cancer Neg Hx     Allergies  Allergen Reactions   Atorvastatin Calcium Other (See Comments)    MYALGIAS   Pravastatin Sodium Other (See Comments)    LEG CRAMPS   Rosuvastatin Calcium Other (See Comments)    MYALGIA    Statins Other (See Comments)    We have tried several, cannot tolerate  - myalgias    Medication list has been reviewed and updated.  Current Outpatient Medications on File Prior to Visit  Medication Sig Dispense Refill   ALPRAZolam (XANAX) 0.5 MG tablet Take 0.5-1 tablets (0.25-0.5 mg total) by mouth 2 (two) times daily as needed for anxiety. 20 tablet 1   cyclobenzaprine (FLEXERIL) 10 MG tablet Take 1 tablet (10 mg total) by mouth daily as needed for muscle spasms. 30 tablet 0   Evolocumab (REPATHA SURECLICK) 140 MG/ML SOAJ Inject 140 mg into the skin every 14 (fourteen) days. 2 mL 12   ezetimibe (ZETIA) 10 MG tablet Take 1 tablet (10 mg total) by mouth daily. 90 tablet 3   l-methylfolate-B6-B12 (METANX) 3-35-2 MG TABS Take 1 tablet by mouth daily. 60 tablet 1   metFORMIN (GLUCOPHAGE-XR) 500 MG 24 hr tablet TAKE 2 TABLETS BY MOUTH EVERY DAY WITH BREAKFAST 180 tablet 3   Multiple Vitamin (MULTIVITAMIN) tablet Take 1 tablet by mouth daily.     Multiple Vitamins-Minerals (PRESERVISION AREDS 2 PO) Take 1 tablet by mouth in the morning and at bedtime.     Needle, Disp, (HYPODERMIC NEEDLE 23GX1") 23G X 1" MISC Use to give IM injection as directed 50 each 1   sildenafil (VIAGRA) 50 MG tablet Take 50 mg by mouth as needed for erectile dysfunction.     testosterone cypionate (DEPOTESTOSTERONE CYPIONATE) 200 MG/ML injection INJECT 1 ML(200MG ) INTRAMUSCULARLY EVERY 14 DAYS. 6 mL 0   tirzepatide (MOUNJARO) 5 MG/0.5ML Pen Inject 5 mg into the skin once a week. 6 mL 1   traZODone (DESYREL) 50 MG tablet TAKE 1/2 TO 1 TABLET BY MOUTH AT BEDTIME AS NEEDED FOR SLEEP 90 tablet 1   No current facility-administered medications on file prior to visit.    Review of Systems:  As per HPI- otherwise negative.   Physical Examination: There were no vitals filed for this visit. There were no vitals filed for this visit. There is no height or weight on file to calculate BMI. Ideal Body Weight:    GEN: no  acute distress. HEENT: Atraumatic, Normocephalic.  Ears and Nose: No external deformity. CV: RRR, No M/G/R. No JVD. No thrill. No extra heart sounds. PULM: CTA B, no wheezes, crackles, rhonchi. No retractions. No resp. distress. No accessory muscle use. ABD: S, NT, ND, +BS. No rebound. No HSM. EXTR: No c/c/e PSYCH: Normally interactive. Conversant.  Foot exam  Assessment and Plan: ***  Signed Abbe Amsterdam, MD

## 2023-06-18 ENCOUNTER — Encounter: Payer: Self-pay | Admitting: Family Medicine

## 2023-06-18 ENCOUNTER — Ambulatory Visit (INDEPENDENT_AMBULATORY_CARE_PROVIDER_SITE_OTHER): Payer: Medicare HMO | Admitting: Family Medicine

## 2023-06-18 VITALS — BP 114/72 | HR 77 | Temp 97.7°F | Resp 18 | Ht 75.0 in | Wt 213.4 lb

## 2023-06-18 DIAGNOSIS — T466X5A Adverse effect of antihyperlipidemic and antiarteriosclerotic drugs, initial encounter: Secondary | ICD-10-CM | POA: Diagnosis not present

## 2023-06-18 DIAGNOSIS — E785 Hyperlipidemia, unspecified: Secondary | ICD-10-CM | POA: Diagnosis not present

## 2023-06-18 DIAGNOSIS — M791 Myalgia, unspecified site: Secondary | ICD-10-CM | POA: Diagnosis not present

## 2023-06-18 DIAGNOSIS — Z79899 Other long term (current) drug therapy: Secondary | ICD-10-CM | POA: Diagnosis not present

## 2023-06-18 DIAGNOSIS — R911 Solitary pulmonary nodule: Secondary | ICD-10-CM

## 2023-06-18 DIAGNOSIS — E291 Testicular hypofunction: Secondary | ICD-10-CM | POA: Diagnosis not present

## 2023-06-18 DIAGNOSIS — R972 Elevated prostate specific antigen [PSA]: Secondary | ICD-10-CM | POA: Diagnosis not present

## 2023-06-18 DIAGNOSIS — E119 Type 2 diabetes mellitus without complications: Secondary | ICD-10-CM

## 2023-06-18 LAB — COMPREHENSIVE METABOLIC PANEL
ALT: 42 U/L (ref 0–53)
AST: 24 U/L (ref 0–37)
Albumin: 4.5 g/dL (ref 3.5–5.2)
Alkaline Phosphatase: 59 U/L (ref 39–117)
BUN: 14 mg/dL (ref 6–23)
CO2: 30 meq/L (ref 19–32)
Calcium: 9.4 mg/dL (ref 8.4–10.5)
Chloride: 100 meq/L (ref 96–112)
Creatinine, Ser: 1.06 mg/dL (ref 0.40–1.50)
GFR: 71.25 mL/min (ref >60.00)
Glucose, Bld: 153 mg/dL — ABNORMAL HIGH (ref 70–99)
Potassium: 4.4 meq/L (ref 3.5–5.1)
Sodium: 137 meq/L (ref 135–145)
Total Bilirubin: 0.7 mg/dL (ref 0.2–1.2)
Total Protein: 6.9 g/dL (ref 6.0–8.3)

## 2023-06-18 LAB — LIPID PANEL
Cholesterol: 92 mg/dL (ref 0–200)
HDL: 55.8 mg/dL (ref >39.00)
LDL Cholesterol: 17 mg/dL (ref 0–99)
NonHDL: 36.39
Total CHOL/HDL Ratio: 2
Triglycerides: 96 mg/dL (ref 0.0–149.0)
VLDL: 19.2 mg/dL (ref 0.0–40.0)

## 2023-06-18 LAB — CBC
HCT: 49.4 % (ref 39.0–52.0)
Hemoglobin: 16.4 g/dL (ref 13.0–17.0)
MCHC: 33.3 g/dL (ref 30.0–36.0)
MCV: 95.2 fL (ref 78.0–100.0)
Platelets: 236 10*3/uL (ref 150.0–400.0)
RBC: 5.19 Mil/uL (ref 4.22–5.81)
RDW: 12.6 % (ref 11.5–15.5)
WBC: 6.7 10*3/uL (ref 4.0–10.5)

## 2023-06-18 LAB — HEMOGLOBIN A1C: Hgb A1c MFr Bld: 7.1 % — ABNORMAL HIGH (ref 4.6–6.5)

## 2023-06-18 LAB — MICROALBUMIN / CREATININE URINE RATIO
Creatinine,U: 172.4 mg/dL
Microalb Creat Ratio: 0.7 mg/g (ref 0.0–30.0)
Microalb, Ur: 1.3 mg/dL (ref 0.0–1.9)

## 2023-06-18 LAB — PSA: PSA: 3.24 ng/mL (ref 0.10–4.00)

## 2023-06-19 ENCOUNTER — Encounter: Payer: Self-pay | Admitting: Family Medicine

## 2023-06-19 DIAGNOSIS — E119 Type 2 diabetes mellitus without complications: Secondary | ICD-10-CM

## 2023-06-19 LAB — TESTOSTERONE TOTAL,FREE,BIO, MALES
Albumin: 4.5 g/dL (ref 3.6–5.1)
Sex Hormone Binding: 36 nmol/L (ref 22–77)
Testosterone, Bioavailable: 281.9 ng/dL — ABNORMAL HIGH (ref 15.0–150.0)
Testosterone, Free: 137.1 pg/mL — ABNORMAL HIGH (ref 6.0–73.0)
Testosterone: 951 ng/dL — ABNORMAL HIGH (ref 250–827)

## 2023-06-22 ENCOUNTER — Other Ambulatory Visit: Payer: Self-pay | Admitting: Cardiology

## 2023-06-22 DIAGNOSIS — E782 Mixed hyperlipidemia: Secondary | ICD-10-CM

## 2023-06-22 DIAGNOSIS — I2510 Atherosclerotic heart disease of native coronary artery without angina pectoris: Secondary | ICD-10-CM

## 2023-06-24 MED ORDER — TIRZEPATIDE 2.5 MG/0.5ML ~~LOC~~ SOAJ
2.5000 mg | SUBCUTANEOUS | 1 refills | Status: DC
Start: 2023-06-24 — End: 2023-12-03

## 2023-07-02 ENCOUNTER — Telehealth: Payer: Self-pay | Admitting: Family Medicine

## 2023-07-02 NOTE — Telephone Encounter (Signed)
Copied from CRM [id number]. Topic: Medicare AWV >> Jul 02, 2023 10:45 AM Payton Doughty wrote: Reason for CRM: Called LVM 07/02/2023 to schedule Annual Wellness Visit  Verlee Rossetti; Care Guide Ambulatory Clinical Support [facility] Direct Dial: [phone]

## 2023-07-22 ENCOUNTER — Encounter: Payer: Self-pay | Admitting: Pharmacist

## 2023-07-22 ENCOUNTER — Other Ambulatory Visit: Payer: Self-pay | Admitting: Pharmacist

## 2023-07-22 NOTE — Patient Instructions (Addendum)
Randy Orr,  Below is the information that your pharmacy will need to process your [facility] for cholesterol medications. If you have any questions please let me know.   Henrene Pastor, PharmD Clinical Pharmacist [facility]

## 2023-07-22 NOTE — Progress Notes (Signed)
Pharmacy Note  07/22/2023 Name: Randy Orr MRN: [id number] DOB: Oct 04, 1950  Subjective: Randy Orr is a 72 y.o. year old male who is a primary care patient of Copland, Gwenlyn Found, MD. Clinical Pharmacist Practitioner referral was placed to assist with medication management, diabetes and hyperlipidemia.    Engaged with patient by telephone for follow up visit today.   Medication management:  Reviewed meds and updated med list.    Type 2 DM: Current medication: Mounjaro 2.5mg  weekly and metfomrin ER 500mg  - take 2 tabs =1000mg  each morning  Past medications tried: [place] 10mg  - stopped due to cost and did not get A1c to goal; Mounjaro 5mg  weekly - testosterone increased so lowered dose back to 2.5mg .   Weight at start of Mounjaro = 220 lbs Last weight = 213 lbs Total weight loss =  7 lbs  Starting BMI = 27.57 Current BMI = 26.67  Hyperlipidemia:  Current therapy: Repatha 140mg  every 14 days and ezetimibe 10mg  daily. Eses [employer] Grant to offset cost of cholesteorl medications. His [employer] grant will expire 08/13/2023.  Intolerant to several statins. Even tried to take CoEnzyme Q10 to see if helped with muscle pain but did not help.  He has taken rosuvastatin 5mg  daily and every 3 days - had leg cramps, myalgias and felt horrible. (07/04/2019 to 11/04/2019)  pravastatin 40mg  09/23/2011 to 09/02/2012 - stopped due to leg cramps / myalgias.  atorvastatin - dose unknown - stopped due to myopathy\  Coronary Calcium Score elevated.  Current therapy: Repatha and ezetimibe 10mg  daily   CT of chest 06/06/2022 showed an aneurysm.   Objective: Review of patient status, including review of consultants reports, laboratory and other test data, was performed as part of comprehensive.  Lab Results  Component Value Date   CREATININE 1.06 06/18/2023   CREATININE 1.20 01/14/2023   CREATININE 1.11 12/04/2022    Lab Results  Component Value Date   HGBA1C 7.1 (H) 06/18/2023        Component Value Date/Time   CHOL 92 06/18/2023 0854   TRIG 96.0 06/18/2023 0854   HDL 55.80 06/18/2023 0854   CHOLHDL 2 06/18/2023 0854   VLDL 19.2 06/18/2023 0854   LDLCALC 17 06/18/2023 0854   LDLDIRECT 146.0 07/01/2019 0904     Clinical ASCVD: No  but coronary calcium score pending The ASCVD Risk score (Arnett DK, et al., 2019) failed to calculate for the following reasons:   The valid total cholesterol range is 130 to 320 mg/dL    BP Readings from Last 3 Encounters:  06/18/23 114/72  04/21/23 116/72  04/02/23 130/80     Allergies  Allergen Reactions   Atorvastatin Calcium Other (See Comments)    MYALGIAS   Pravastatin Sodium Other (See Comments)    LEG CRAMPS   Rosuvastatin Calcium Other (See Comments)    MYALGIA   Statins Other (See Comments)    We have tried several, cannot tolerate  - myalgias    Medications Reviewed Today   Medications were not reviewed in this encounter     Patient Active Problem List   Diagnosis Date Noted   Thoracic aortic aneurysm (HCC) 06/10/2022   Acute pain of right knee 10/16/2021   Statin myopathy 09/27/2020   Left knee pain 06/06/2016   Peripheral sensory neuropathy due to type 2 diabetes mellitus (HCC) 05/17/2014   Snoring 05/17/2014   Obesity 05/17/2014   Diabetes mellitus (HCC) 09/23/2011   Hypogonadism male 09/23/2011   Hyperlipidemia 09/23/2011  Medication Assistance:   Screened for patient assistance program for [place] or Ozempic in the past. Household income did not qualify for either [place] or Ozempic medication assistance program.   Assisted patient in repplying for [employer] Hypercholesterolemia Fund to help with cost of Repatha. Patient was approved 08/14/2023 thru 08/12/2024 for $2500   Assessment / Plan: Type 2 DM: A1c 7.1 (above goal of < 7.0%) Continue Mounjaro 2.5mg  weekly Will recheck A1c in a few weeks - if not < 7.0% reconsider escalation of Mounjaro dose.   Hyperlipidemia with elevated  CAC: At goal of < 70. Intolerant to several statins.  Continue Repatha and ezetimibe  Medication management:  Reviewed and updated medication list Reviewed refill history and adherence  Follow Up:  Telephone follow up appointment with care management team member scheduled for:  12 months      Henrene Pastor, PharmD Clinical Pharmacist [facility]  - [facility] [phone]

## 2023-07-27 ENCOUNTER — Encounter: Payer: Self-pay | Admitting: Family Medicine

## 2023-07-27 NOTE — Progress Notes (Deleted)
[facility] [address], [address] [place], [place] [address] [phone]  Date:  07/30/2023   Name:  Randy Orr   DOB:  28-Nov-1950   MRN:  [id number]  PCP:  Pearline Cables, MD    Chief Complaint: No chief complaint on file.   History of Present Illness:  Randy Orr is a 72 y.o. very pleasant male patient who presents with the following:  Patient seen today for PSA follow-up Lab Results  Component Value Date   PSA 3.24 06/18/2023   PSA 2.54 12/04/2022   PSA 2.76 06/05/2022      Patient Active Problem List   Diagnosis Date Noted   Thoracic aortic aneurysm (HCC) 06/10/2022   Acute pain of right knee 10/16/2021   Statin myopathy 09/27/2020   Left knee pain 06/06/2016   Peripheral sensory neuropathy due to type 2 diabetes mellitus (HCC) 05/17/2014   Snoring 05/17/2014   Obesity 05/17/2014   Diabetes mellitus (HCC) 09/23/2011   Hypogonadism male 09/23/2011   Hyperlipidemia 09/23/2011    Past Medical History:  Diagnosis Date   Diabetes mellitus without complication (HCC)    DM type 2   GERD (gastroesophageal reflux disease)    Hyperlipidemia    Low testosterone    Obesity 05/17/2014   Peripheral sensory neuropathy due to type 2 diabetes mellitus (HCC) 05/17/2014   Snoring 05/17/2014    Past Surgical History:  Procedure Laterality Date   COLONOSCOPY  07/18/2014    Social History   Tobacco Use   Smoking status: Former   Smokeless tobacco: Never   Tobacco comments:    smoked 30 years ago.  Substance Use Topics   Alcohol use: Yes    Alcohol/week: 5.0 standard drinks of alcohol    Types: 5 Glasses of wine per week   Drug use: No    Family History  Problem Relation Age of Onset   Heart attack Father    Diabetes Brother    Atrial fibrillation Brother    Colon cancer Neg Hx    Esophageal cancer Neg Hx    Rectal cancer Neg Hx    Stomach cancer Neg Hx     Allergies  Allergen Reactions    Atorvastatin Calcium Other (See Comments)    MYALGIAS   Pravastatin Sodium Other (See Comments)    LEG CRAMPS   Rosuvastatin Calcium Other (See Comments)    MYALGIA   Statins Other (See Comments)    We have tried several, cannot tolerate  - myalgias    Medication list has been reviewed and updated.  Current Outpatient Medications on File Prior to Visit  Medication Sig Dispense Refill   ALPRAZolam (XANAX) 0.5 MG tablet Take 0.5-1 tablets (0.25-0.5 mg total) by mouth 2 (two) times daily as needed for anxiety. 20 tablet 1   cyclobenzaprine (FLEXERIL) 10 MG tablet Take 1 tablet (10 mg total) by mouth daily as needed for muscle spasms. 30 tablet 0   Evolocumab (REPATHA SURECLICK) 140 MG/ML SOAJ INJECT 140 MG INTO THE SKIN EVERY 14 (FOURTEEN) DAYS. 6 mL 1   ezetimibe (ZETIA) 10 MG tablet Take 1 tablet (10 mg total) by mouth daily. 90 tablet 3   l-methylfolate-B6-B12 (METANX) 3-35-2 MG TABS Take 1 tablet by mouth daily. 60 tablet 1   metFORMIN (GLUCOPHAGE-XR) 500 MG 24 hr tablet TAKE 2 TABLETS BY MOUTH EVERY DAY WITH BREAKFAST 180 tablet 3   Multiple Vitamin (MULTIVITAMIN) tablet Take 1 tablet by mouth  daily.     Multiple Vitamins-Minerals (PRESERVISION AREDS 2 PO) Take 1 tablet by mouth in the morning and at bedtime.     Needle, Disp, (HYPODERMIC NEEDLE 23GX1") 23G X 1" MISC Use to give IM injection as directed 50 each 1   sildenafil (VIAGRA) 50 MG tablet Take 50 mg by mouth as needed for erectile dysfunction.     testosterone cypionate (DEPOTESTOSTERONE CYPIONATE) 200 MG/ML injection INJECT 1 ML(200MG ) INTRAMUSCULARLY EVERY 14 DAYS. 6 mL 0   tirzepatide (MOUNJARO) 2.5 MG/0.5ML Pen Inject 2.5 mg into the skin once a week. 6 mL 1   tirzepatide (MOUNJARO) 5 MG/0.5ML Pen Inject 5 mg into the skin once a week. (Patient not taking: Reported on 07/22/2023) 6 mL 1   traZODone (DESYREL) 50 MG tablet TAKE 1/2 TO 1 TABLET BY MOUTH AT BEDTIME AS NEEDED FOR SLEEP 90 tablet 1   UNABLE TO FIND Apply  topically in the morning and at bedtime. Med Name: TRIPLE ROSACEA CREAM AZELAIC ACID15%/METRONIDAZOLE 1%/IVERMECTIN 1%     No current facility-administered medications on file prior to visit.    Review of Systems:  ***  Physical Examination: There were no vitals filed for this visit. There were no vitals filed for this visit. There is no height or weight on file to calculate BMI. Ideal Body Weight:    ***  Assessment and Plan: ***  Signed Abbe Amsterdam, MD

## 2023-07-30 ENCOUNTER — Encounter: Payer: Self-pay | Admitting: Family Medicine

## 2023-07-30 ENCOUNTER — Other Ambulatory Visit (INDEPENDENT_AMBULATORY_CARE_PROVIDER_SITE_OTHER): Payer: Medicare HMO

## 2023-07-30 ENCOUNTER — Ambulatory Visit: Payer: Medicare HMO | Admitting: Family Medicine

## 2023-07-30 DIAGNOSIS — E119 Type 2 diabetes mellitus without complications: Secondary | ICD-10-CM | POA: Diagnosis not present

## 2023-07-30 DIAGNOSIS — H524 Presbyopia: Secondary | ICD-10-CM | POA: Diagnosis not present

## 2023-07-30 DIAGNOSIS — E291 Testicular hypofunction: Secondary | ICD-10-CM | POA: Diagnosis not present

## 2023-07-30 DIAGNOSIS — R972 Elevated prostate specific antigen [PSA]: Secondary | ICD-10-CM

## 2023-07-30 LAB — PSA: PSA: 4.47 ng/mL — ABNORMAL HIGH (ref 0.10–4.00)

## 2023-07-31 ENCOUNTER — Encounter: Payer: Self-pay | Admitting: Family Medicine

## 2023-07-31 LAB — TESTOSTERONE TOTAL,FREE,BIO, MALES
Albumin: 4.3 g/dL (ref 3.6–5.1)
Sex Hormone Binding: 31 nmol/L (ref 22–77)
Testosterone, Bioavailable: 367.5 ng/dL — ABNORMAL HIGH (ref 15.0–150.0)
Testosterone, Free: 186.6 pg/mL — ABNORMAL HIGH (ref 6.0–73.0)
Testosterone: 1070 ng/dL — ABNORMAL HIGH (ref 250–827)

## 2023-08-01 IMAGING — MR MR KNEE*R* W/O CM
4 of 7 series · 21 of 40 positions shown · non-contrast
Comparison: None.

CLINICAL DATA: Posterior knee pain for 5 weeks.

EXAM:
MRI OF THE RIGHT KNEE WITHOUT CONTRAST
TECHNIQUE: Multiplanar, multisequence MR imaging of the knee was performed. No
intravenous contrast was administered.

[Series 3: T2 fat-sat · axial · 4.0mm · 0.50mm/px · z∈[-81,+44]mm · 5 of 26 slices shown]
[im 1/26]
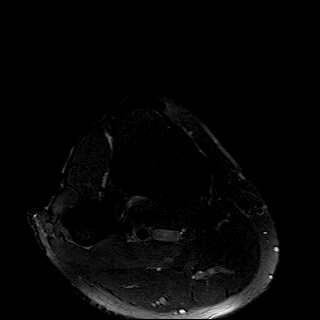
[im 7/26]
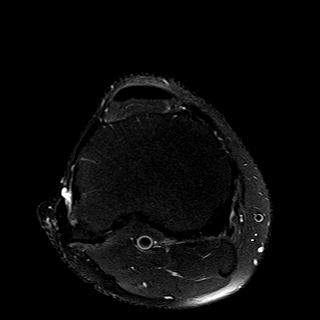
[im 13/26]
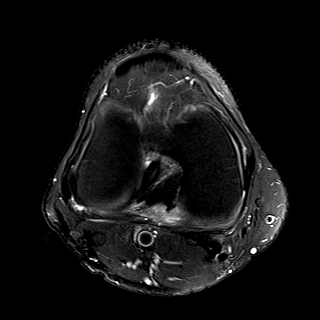
[im 19/26]
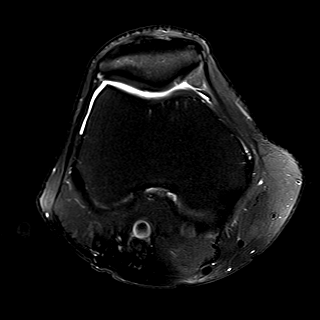
[im 26/26]
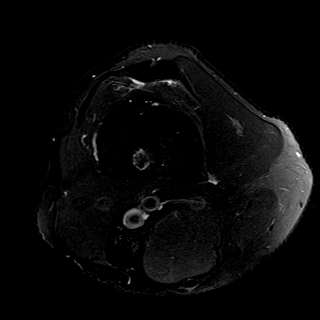

[Series 7: PD fat-sat · sagittal · 3.0mm · 0.29mm/px · 7 of 29 slices shown (1 of 3)]
[im 1/29]
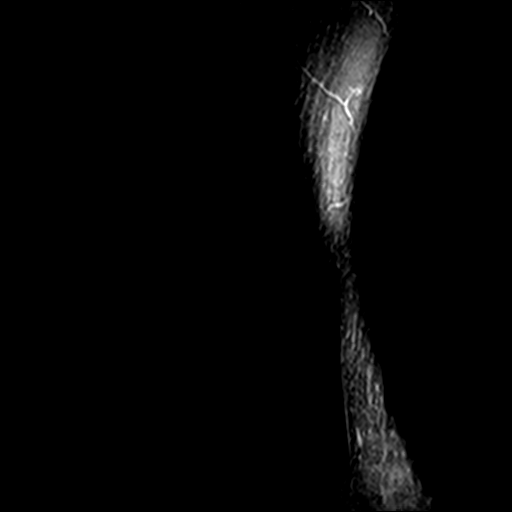
[im 5/29]
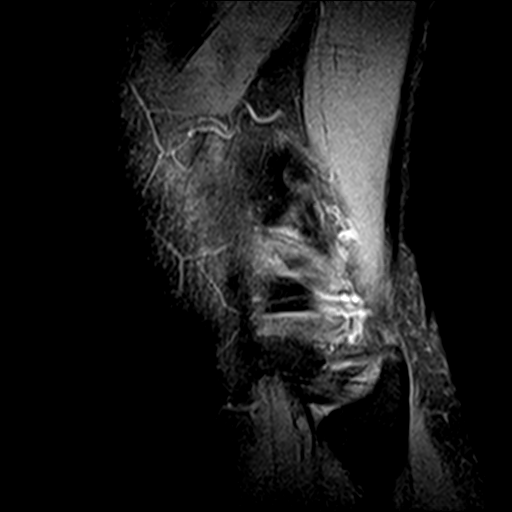
[im 10/29]
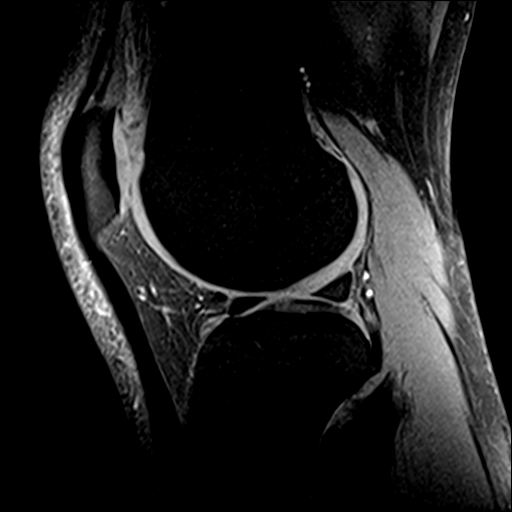
[im 15/29]
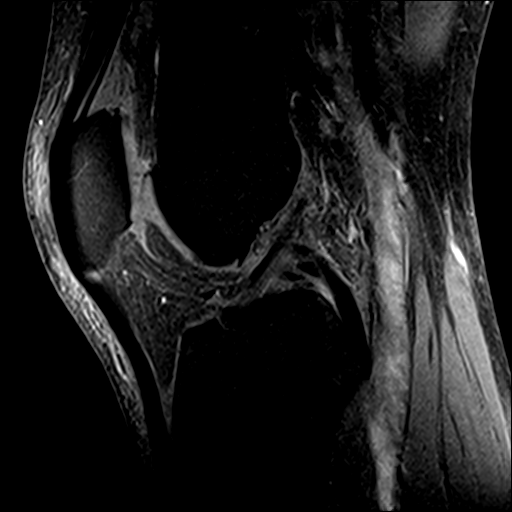
[im 19/29]
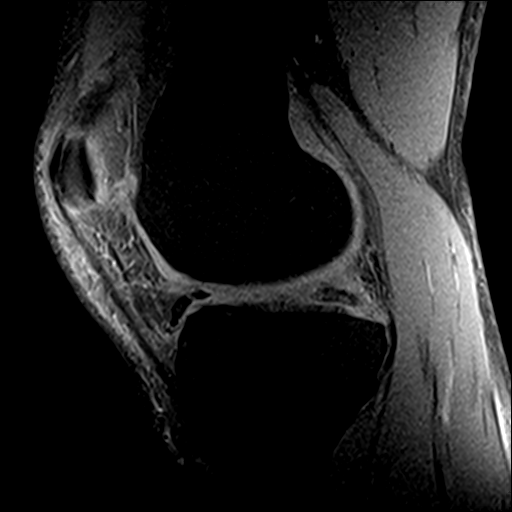
[im 24/29]
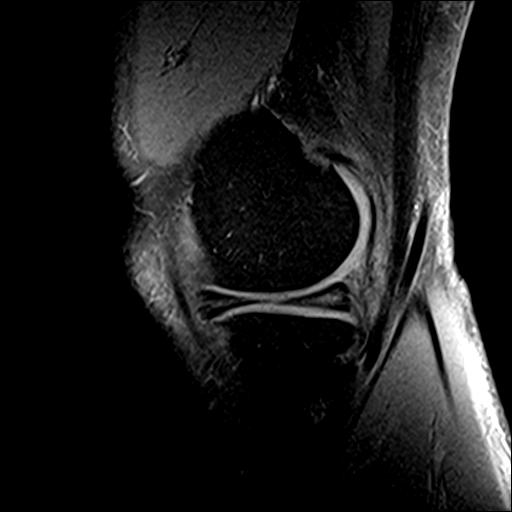
[im 29/29]
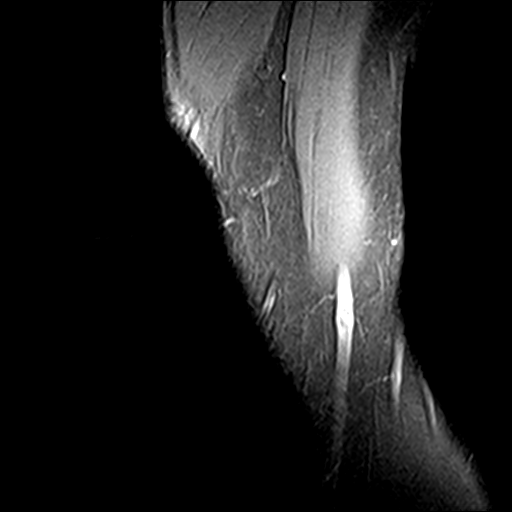

[Series 8: PD fat-sat · coronal · 3.0mm · 0.29mm/px · 7 of 30 slices shown (2 of 3)]
[im 1/30]
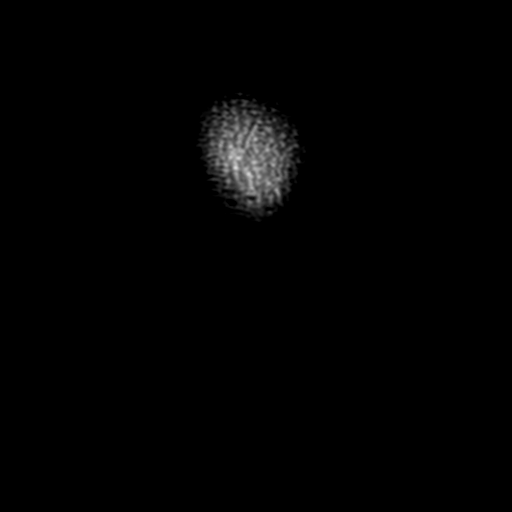
[im 5/30]
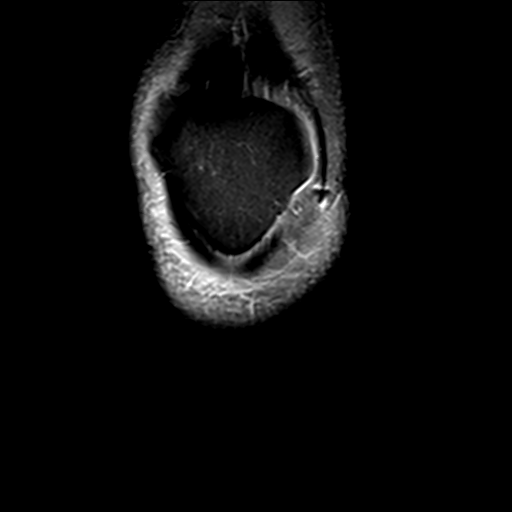
[im 10/30]
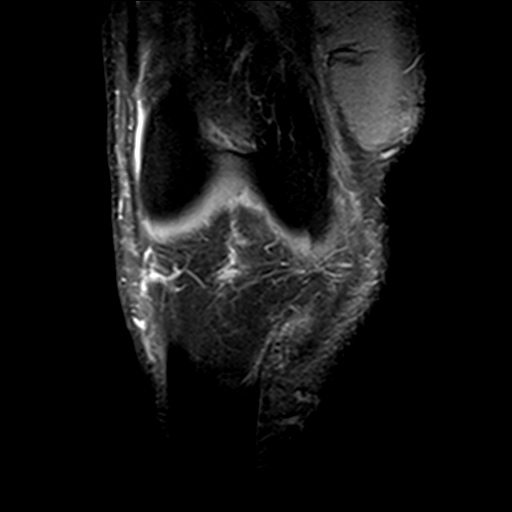
[im 15/30]
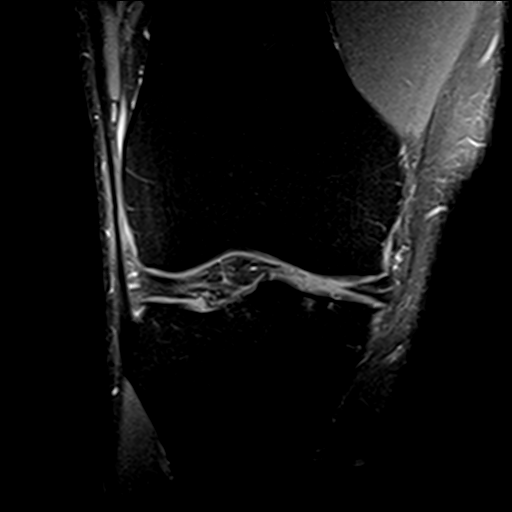
[im 20/30]
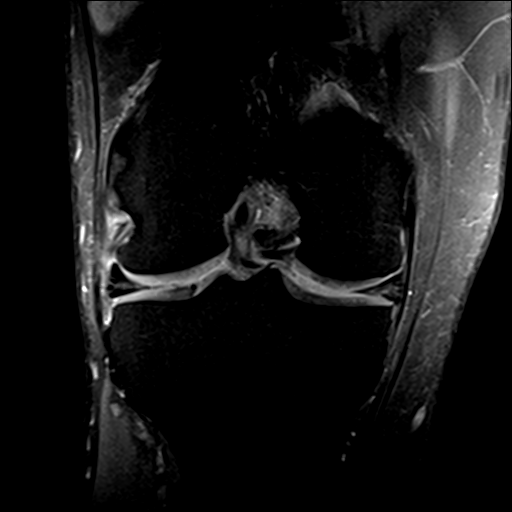
[im 25/30]
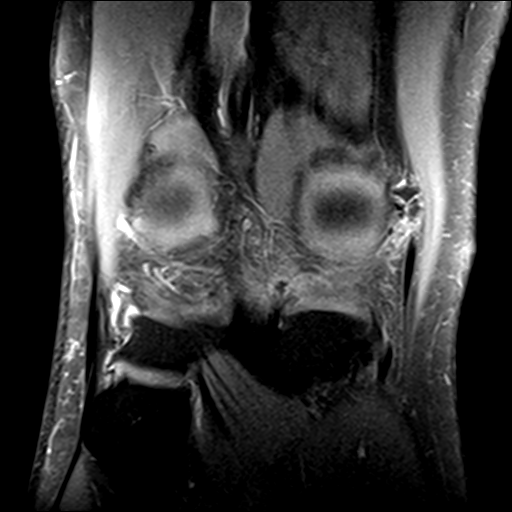
[im 30/30]
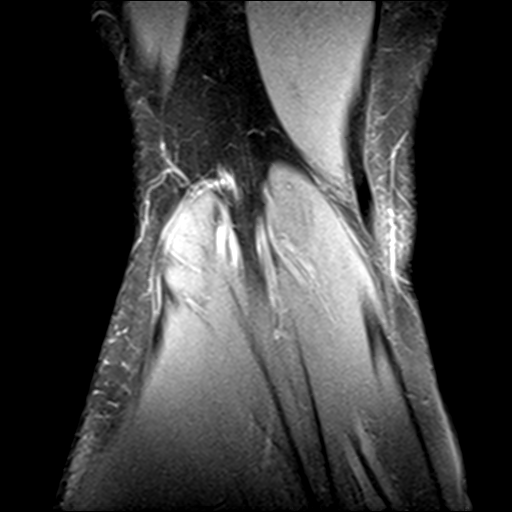

[Series 9: PD fat-sat · coronal · 2.3mm · 0.29mm/px · 2 of 9 slices shown (3 of 3)]
[im 1/9]
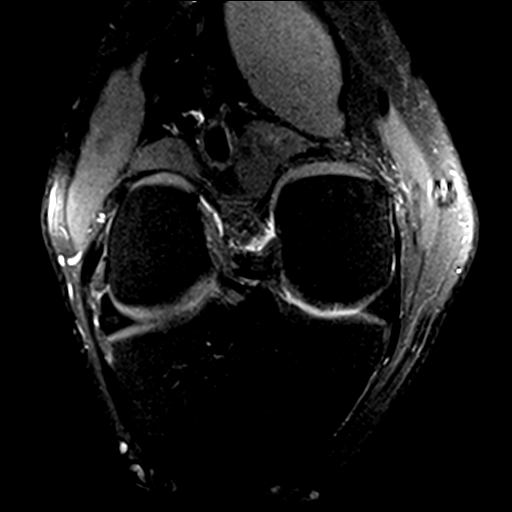
[im 9/9]
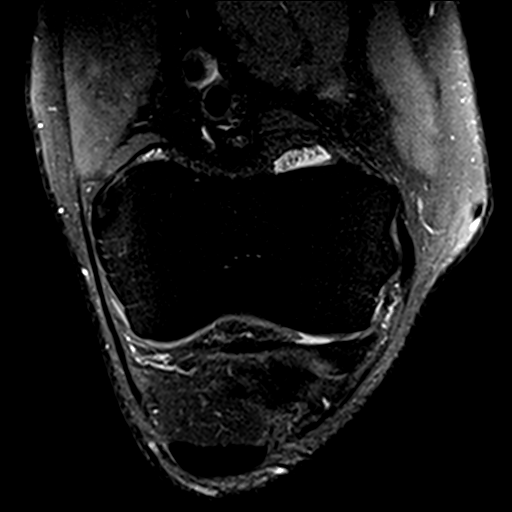

[21 of 40 positions shown; findings below may reference images not displayed]

FINDINGS: MENISCI

Medial: Radial tear of the posterior horn of the medial meniscus at
the meniscal root. Degeneration of the posterior horn the medial
meniscus.

Lateral: Degeneration of the body of the lateral meniscus. No
discrete lateral meniscal tear.

LIGAMENTS

Cruciates: ACL and PCL are intact.

Collaterals: Medial collateral ligament is intact. Lateral
collateral ligament complex is intact.

CARTILAGE

Patellofemoral: Small cartilage fissure of the medial patellar
facet.

Medial: Partial-thickness cartilage loss of the medial femorotibial
compartment with cartilage fissuring of the weight-bearing surface
of the medial femoral condyle.

Lateral: Mild cartilage irregularity of the lateral tibial plateau.
No focal chondral defect.

JOINT: No joint effusion. Edema in Hoffa's fat. No plical
thickening. Edema in the anterior suprapatellar fat pad.

POPLITEAL FOSSA: Popliteus tendon is intact. No Baker's cyst.

EXTENSOR MECHANISM: Intact quadriceps tendon. Intact patellar
tendon. Intact lateral patellar retinaculum. Intact medial patellar
retinaculum. Intact MPFL.

BONES: No aggressive osseous lesion. No fracture or dislocation.

Other: No fluid collection or hematoma. Muscles are normal.
IMPRESSION: 1. Radial tear of the posterior horn of the medial meniscus at the
meniscal root. Degeneration of the posterior horn the medial
meniscus.
2. Partial-thickness cartilage loss of the medial femorotibial
compartment with cartilage fissuring of the weight-bearing surface
of the medial femoral condyle.

## 2023-08-08 ENCOUNTER — Other Ambulatory Visit: Payer: Self-pay | Admitting: Family Medicine

## 2023-08-08 DIAGNOSIS — E291 Testicular hypofunction: Secondary | ICD-10-CM

## 2023-08-08 NOTE — Telephone Encounter (Signed)
Requesting: testosterone 200 mg  Contract: none UDS:  none Last Visit: 06/18/2023 Next Visit: not scheduled Last Refill: 06/12/2023 #6 mL no RFs  Please Advise

## 2023-08-14 DIAGNOSIS — J019 Acute sinusitis, unspecified: Secondary | ICD-10-CM | POA: Diagnosis not present

## 2023-08-14 DIAGNOSIS — B349 Viral infection, unspecified: Secondary | ICD-10-CM | POA: Diagnosis not present

## 2023-08-14 DIAGNOSIS — R051 Acute cough: Secondary | ICD-10-CM | POA: Diagnosis not present

## 2023-08-14 DIAGNOSIS — Z20822 Contact with and (suspected) exposure to covid-19: Secondary | ICD-10-CM | POA: Diagnosis not present

## 2023-08-18 DIAGNOSIS — H40003 Preglaucoma, unspecified, bilateral: Secondary | ICD-10-CM | POA: Diagnosis not present

## 2023-08-21 ENCOUNTER — Telehealth: Payer: Self-pay

## 2023-08-21 ENCOUNTER — Encounter: Payer: Self-pay | Admitting: Urology

## 2023-08-21 ENCOUNTER — Ambulatory Visit: Payer: Medicare HMO | Admitting: Urology

## 2023-08-21 VITALS — BP 148/75 | HR 83 | Ht 75.0 in | Wt 213.0 lb

## 2023-08-21 DIAGNOSIS — R399 Unspecified symptoms and signs involving the genitourinary system: Secondary | ICD-10-CM

## 2023-08-21 DIAGNOSIS — R972 Elevated prostate specific antigen [PSA]: Secondary | ICD-10-CM

## 2023-08-21 DIAGNOSIS — H40003 Preglaucoma, unspecified, bilateral: Secondary | ICD-10-CM | POA: Diagnosis not present

## 2023-08-21 LAB — URINALYSIS, ROUTINE W REFLEX MICROSCOPIC
Bilirubin, UA: NEGATIVE
Ketones, UA: NEGATIVE
Leukocytes,UA: NEGATIVE
Nitrite, UA: NEGATIVE
Protein,UA: NEGATIVE
RBC, UA: NEGATIVE
Specific Gravity, UA: >1.03 — ABNORMAL HIGH (ref 1.005–1.030)
Urobilinogen, Ur: 0.2 mg/dL (ref 0.2–1.0)
pH, UA: 5.5 (ref 5.0–7.5)

## 2023-08-21 MED ORDER — TAMSULOSIN HCL 0.4 MG PO CAPS: 0.4000 mg | ORAL_CAPSULE | Freq: Every day | ORAL | 3 refills | Status: DC

## 2023-08-21 NOTE — Progress Notes (Signed)
 Marland Kitchen

## 2023-08-21 NOTE — Progress Notes (Signed)
 Assessment: 1. Elevated PSA   2. Lower urinary tract symptoms (LUTS)      Plan: Today I had a long discussion with the patient regarding his elevated PSA and lower urinary tract symptoms.  Following our discussion he would like to begin new medical therapy-- Rx: Tamsulosin  0.4 mg daily Nature of medication including proper utilization as well as potential adverse events and side effects reviewed.  Patient will follow-up in approximately 8 weeks with free and total PSA prior to visit.  If PSA level at that time is still of concern would proceed with multiparametric prostate MRI.  Chief Complaint: elevated psa  History of Present Illness:  Randy Orr is a 73 y.o. male who is seen in consultation from Copland, [name], MD for evaluation of elevated psa.  Patient has had routine PSA testing over the years and his PSA has remained very stable in the 2.5 range over the last several years until 06/2023 when it bumped up to 3.2 and when rechecked a month later was 4.47.  The patient has been on testosterone  replacement therapy for over 10 years with IM injections.  He is currently undergoing a dose reduction.  He has been followed very closely for this as well.  His levels over the last 6 months have been relatively high.  Finally, patient reports progressive worsening lower urinary tract symptoms gradually over the last several years.  Current IPSS = 14.  No family history of prostate cancer  DRE today reveals a 40 g gland which is benign to palpation       Past Medical History:  Past Medical History:  Diagnosis Date   Diabetes mellitus without complication (HCC)    DM type 2   GERD (gastroesophageal reflux disease)    Hyperlipidemia    Low testosterone     Obesity 05/17/2014   Peripheral sensory neuropathy due to type 2 diabetes mellitus (HCC) 05/17/2014   Snoring 05/17/2014    Past Surgical History:  Past Surgical History:  Procedure Laterality Date   COLONOSCOPY   07/18/2014    Allergies:  Allergies  Allergen Reactions   Atorvastatin Calcium  Other (See Comments)    MYALGIAS   Pravastatin  Sodium Other (See Comments)    LEG CRAMPS   Rosuvastatin  Calcium  Other (See Comments)    MYALGIA   Statins Other (See Comments)    We have tried several, cannot tolerate  - myalgias    Family History:  Family History  Problem Relation Age of Onset   Heart attack Father    Diabetes Brother    Atrial fibrillation Brother    Colon cancer Neg Hx    Esophageal cancer Neg Hx    Rectal cancer Neg Hx    Stomach cancer Neg Hx     Social History:  Social History   Tobacco Use   Smoking status: Former   Smokeless tobacco: Never   Tobacco comments:    smoked 30 years ago.  Substance Use Topics   Alcohol use: Yes    Alcohol/week: 5.0 standard drinks of alcohol    Types: 5 Glasses of wine per week   Drug use: No    Review of symptoms:  Constitutional:  Negative for unexplained weight loss, night sweats, fever, chills ENT:  Negative for nose bleeds, sinus pain, painful swallowing CV:  Negative for chest pain, shortness of breath, exercise intolerance, palpitations, loss of consciousness Resp:  Negative for cough, wheezing, shortness of breath GI:  Negative for nausea, vomiting, diarrhea, bloody stools GU:  Positives noted in HPI; otherwise negative for gross hematuria, dysuria, urinary incontinence Neuro:  Negative for seizures, poor balance, limb weakness, slurred speech Psych:  Negative for lack of energy, depression, anxiety Endocrine:  Negative for polydipsia, polyuria, symptoms of hypoglycemia (dizziness, hunger, sweating) Hematologic:  Negative for anemia, purpura, petechia, prolonged or excessive bleeding, use of anticoagulants  Allergic:  Negative for difficulty breathing or choking as a result of exposure to anything; no shellfish allergy; no allergic response (rash/itch) to materials, foods  Physical exam: BP (!) 148/75   Pulse 83   Ht 6'  3 (1.905 m)   Wt 213 lb (96.6 kg)   BMI 26.62 kg/m  GENERAL APPEARANCE:  Well appearing, well developed, well nourished, NAD  GU: Normal external genitalia DRE: Normal sphincter tone; prostate is approximately 40 g without evidence of nodules or induration  Results: UA clear

## 2023-08-21 NOTE — Telephone Encounter (Signed)
 PA initiated via Covermymeds; KEY: [id number]. Awaiting determination.

## 2023-08-22 NOTE — Telephone Encounter (Signed)
 PA approved.   This approval authorizes your coverage from 08/13/2023 - 08/11/2024

## 2023-08-27 ENCOUNTER — Other Ambulatory Visit: Payer: Self-pay | Admitting: Family Medicine

## 2023-08-29 ENCOUNTER — Encounter: Payer: Self-pay | Admitting: Family Medicine

## 2023-08-29 DIAGNOSIS — R972 Elevated prostate specific antigen [PSA]: Secondary | ICD-10-CM

## 2023-08-29 DIAGNOSIS — E291 Testicular hypofunction: Secondary | ICD-10-CM

## 2023-08-30 ENCOUNTER — Encounter: Payer: Self-pay | Admitting: Family Medicine

## 2023-09-15 ENCOUNTER — Encounter (HOSPITAL_BASED_OUTPATIENT_CLINIC_OR_DEPARTMENT_OTHER): Payer: Self-pay

## 2023-09-15 ENCOUNTER — Telehealth: Payer: Self-pay

## 2023-09-15 NOTE — Telephone Encounter (Signed)
Message has already been routed to [place].

## 2023-09-15 NOTE — Telephone Encounter (Signed)
Copied from CRM [id number]. Topic: Clinical - Prescription Issue >> Sep 15, 2023 10:53 AM Leavy Cella D wrote: Reason for CRM: Patient stated that he has been approved for [employer] for Repatha . Patient has been in communication with Tammy E. Patient stated that he went to pick up medication from pharmacy and was told his copay would be $430 . Patient provided pharmacy with information sent to him VIA MyChart of approval but pharmacy is requesting an official letter and copay card in which patient does not have . Please fax over official letter or copay card info to [facility] at [phone]. Please contact patient VIA MyChart on update of information

## 2023-09-16 ENCOUNTER — Encounter: Payer: Self-pay | Admitting: Pharmacist

## 2023-09-16 NOTE — Progress Notes (Signed)
 Pharmacy Note  09/16/2023 Name: [name] MRN: [id number] DOB: 02-Oct-1950  Subjective: Randy Orr is a 73 y.o. year old male who is a primary care patient of Copland, Randy BROCKS, MD. Clinical Pharmacist Practitioner referral was placed to assist with medication management, diabetes and hyperlipidemia.    Patient sent message thru [facility] that [facility] trouble [facility] [employer].    Hyperlipidemia:  Current therapy: Repatha  140mg  every 14 days and ezetimibe  10mg  daily. Uses [employer] to offset cost of cholesterol medications. His [employer] grant is good 08/17/2023 thru 08/12/2024  Intolerant to several statins. Even tried to take CoEnzyme Q10 to see if helped with muscle pain but did not help.  He has taken rosuvastatin  5mg  daily and every 3 days - had leg cramps, myalgias and felt horrible. (07/04/2019 to 11/04/2019)  pravastatin  40mg  09/23/2011 to 09/02/2012 - stopped due to leg cramps / myalgias.  atorvastatin - dose unknown - stopped due to myopathy\  Coronary Calcium  Score elevated.  Current therapy: Repatha  and ezetimibe  10mg  daily   CT of chest 06/06/2022 showed an aneurysm.   Wt Readings from Last 3 Encounters:  08/21/23 213 lb (96.6 kg)  06/18/23 213 lb 6.4 oz (96.8 kg)  04/21/23 214 lb 3.2 oz (97.2 kg)    Objective: Review of patient status, including review of consultants reports, laboratory and other test data, was performed as part of comprehensive.  Lab Results  Component Value Date   CREATININE 1.06 06/18/2023   CREATININE 1.20 01/14/2023   CREATININE 1.11 12/04/2022    Lab Results  Component Value Date   HGBA1C 7.1 (H) 06/18/2023       Component Value Date/Time   CHOL 92 06/18/2023 0854   TRIG 96.0 06/18/2023 0854   HDL 55.80 06/18/2023 0854   CHOLHDL 2 06/18/2023 0854   VLDL 19.2 06/18/2023 0854   LDLCALC 17 06/18/2023 0854   LDLDIRECT 146.0 07/01/2019 0904     Clinical ASCVD: No  - but has elevated coronary  calcium  score and type 2 DM.  The ASCVD Risk score (Arnett DK, et al., 2019) failed to calculate for the following reasons:   The valid total cholesterol range is 130 to 320 mg/dL    BP Readings from Last 3 Encounters:  08/21/23 (!) 148/75  06/18/23 114/72  04/21/23 116/72     Allergies  Allergen Reactions   Atorvastatin Calcium  Other (See Comments)    MYALGIAS   Pravastatin  Sodium Other (See Comments)    LEG CRAMPS   Rosuvastatin  Calcium  Other (See Comments)    MYALGIA   Statins Other (See Comments)    We have tried several, cannot tolerate  - myalgias    Medications Reviewed Today     Reviewed by Carla Milling, RPH-CPP (Pharmacist) on 09/16/23 at 1442  Med List Status: <None>   Medication Order Taking? Sig Documenting Provider Last Dose Status Informant  ALPRAZolam  (XANAX ) 0.5 MG tablet [id number] No Take 0.5-1 tablets (0.25-0.5 mg total) by mouth 2 (two) times daily as needed for anxiety. Copland, Randy BROCKS, MD Taking Active   cyclobenzaprine  (FLEXERIL ) 10 MG tablet [id number] No Take 1 tablet (10 mg total) by mouth daily as needed for muscle spasms. Copland, Randy BROCKS, MD Taking Active   Evolocumab  (REPATHA  SURECLICK) 140 MG/ML SOAJ [id number] No INJECT 140 MG INTO THE SKIN EVERY 14 (FOURTEEN) DAYS. Lonni Slain, MD Taking Active   ezetimibe  (ZETIA ) 10 MG tablet [id number] No Take 1 tablet (10 mg total) by mouth daily. Copland, Randy  C, MD Taking Active   l-methylfolate-B6-B12 (METANX) 3-35-2 MG TABS [id number] No Take 1 tablet by mouth daily. Dohmeier, Dedra, MD Taking Active            Med Note NICKOLAS VERDELL Kitchens Nov 01, 2019 11:53 AM) Emelda  metFORMIN  (GLUCOPHAGE -XR) 500 MG 24 hr tablet [id number]  TAKE 2 TABLETS BY MOUTH EVERY DAY WITH BREAKFAST Copland, Randy C, MD  Active   Multiple Vitamin (MULTIVITAMIN) tablet [id number] No Take 1 tablet by mouth daily. [provider] Taking Active            Med Note NICKOLAS VERDELL Kitchens Nov 01, 2019 11:55 AM)  Centrum Silver  Multiple Vitamins-Minerals (PRESERVISION AREDS 2 PO) [id number] No Take 1 tablet by mouth in the morning and at bedtime. [provider] Taking Active   Needle, Disp, (HYPODERMIC NEEDLE 23GX1) 23G X 1 MISC [id number] No Use to give IM injection as directed Copland, Randy BROCKS, MD Taking Active   sildenafil  (VIAGRA ) 50 MG tablet [id number] No Take 50 mg by mouth as needed for erectile dysfunction. [provider] Taking Active   tamsulosin  (FLOMAX ) 0.4 MG CAPS capsule [id number]  Take 1 capsule (0.4 mg total) by mouth daily after supper. Shona Layman BROCKS, MD  Active   testosterone  cypionate (DEPOTESTOSTERONE CYPIONATE) 200 MG/ML injection [id number] No INJECT 0.75 ML(150mg ) INTRAMUSCULARLY EVERY 14 DAYS. Copland, Randy BROCKS, MD Taking Active   tirzepatide  (MOUNJARO ) 2.5 MG/0.5ML Pen [id number] No Inject 2.5 mg into the skin once a week. Copland, Randy BROCKS, MD Taking Active   tirzepatide  (MOUNJARO ) 5 MG/0.5ML Pen [id number] No Inject 5 mg into the skin once a week. Copland, Randy BROCKS, MD Taking Active   traZODone  (DESYREL ) 50 MG tablet [id number] No TAKE 1/2 TO 1 TABLET BY MOUTH AT BEDTIME AS NEEDED FOR SLEEP Copland, Randy BROCKS, MD Taking Active   UNABLE TO FIND [id number] No Apply topically in the morning and at bedtime. Med Name: TRIPLE ROSACEA CREAM AZELAIC ACID15%/METRONIDAZOLE  1%/IVERMECTIN 1% [provider] Taking Active             Patient Active Problem List   Diagnosis Date Noted   Thoracic aortic aneurysm (HCC) 06/10/2022   Acute pain of right knee 10/16/2021   Statin myopathy 09/27/2020   Left knee pain 06/06/2016   Peripheral sensory neuropathy due to type 2 diabetes mellitus (HCC) 05/17/2014   Snoring 05/17/2014   Obesity 05/17/2014   Diabetes mellitus (HCC) 09/23/2011   Hypogonadism male 09/23/2011   Hyperlipidemia 09/23/2011       Assessment / Plan: Medication Assistance:    Enrolled in [employer]cholesterole[employer] Fund to help  with cost of Repatha . Patient was approved 08/14/2023 thru 08/12/2024 for $2500. Coordinated with his pharmacy to provide needed information to process. Cost to patient will be $0  Hyperlipidemia with elevated CAC: At goal of < 70. Intolerant to several statins.  Continue Repatha  and ezetimibe    Follow Up:  Telephone follow up appointment with care management team member scheduled for:  12 months      Madelin Ray, PharmD Clinical Pharmacist [facility]  - [facility] [phone]

## 2023-09-25 ENCOUNTER — Other Ambulatory Visit: Payer: Medicare HMO

## 2023-09-25 ENCOUNTER — Other Ambulatory Visit (INDEPENDENT_AMBULATORY_CARE_PROVIDER_SITE_OTHER): Payer: Medicare HMO

## 2023-09-25 DIAGNOSIS — R972 Elevated prostate specific antigen [PSA]: Secondary | ICD-10-CM

## 2023-09-25 DIAGNOSIS — E291 Testicular hypofunction: Secondary | ICD-10-CM | POA: Diagnosis not present

## 2023-09-26 ENCOUNTER — Encounter: Payer: Self-pay | Admitting: Family Medicine

## 2023-09-26 LAB — TESTOSTERONE TOTAL,FREE,BIO, MALES
Albumin: 4.3 g/dL (ref 3.6–5.1)
Sex Hormone Binding: 33 nmol/L (ref 22–77)
Testosterone, Bioavailable: 182.7 ng/dL — ABNORMAL HIGH (ref 15.0–150.0)
Testosterone, Free: 92.8 pg/mL — ABNORMAL HIGH (ref 6.0–73.0)
Testosterone: 646 ng/dL (ref 250–827)

## 2023-09-29 ENCOUNTER — Encounter: Payer: Self-pay | Admitting: Family Medicine

## 2023-09-29 LAB — PSA, TOTAL AND FREE
PSA, % Free: 23 % — ABNORMAL LOW (ref >25)
PSA, Free: 0.8 ng/mL
PSA, Total: 3.5 ng/mL (ref <=4.0)

## 2023-10-16 ENCOUNTER — Encounter: Payer: Self-pay | Admitting: Urology

## 2023-10-16 ENCOUNTER — Ambulatory Visit: Payer: Medicare HMO | Admitting: Urology

## 2023-10-16 VITALS — BP 137/81 | HR 77 | Ht 75.0 in | Wt 213.0 lb

## 2023-10-16 DIAGNOSIS — R399 Unspecified symptoms and signs involving the genitourinary system: Secondary | ICD-10-CM

## 2023-10-16 DIAGNOSIS — R972 Elevated prostate specific antigen [PSA]: Secondary | ICD-10-CM

## 2023-10-16 DIAGNOSIS — N401 Enlarged prostate with lower urinary tract symptoms: Secondary | ICD-10-CM

## 2023-10-16 LAB — URINALYSIS, ROUTINE W REFLEX MICROSCOPIC
Bilirubin, UA: NEGATIVE
Ketones, UA: NEGATIVE
Leukocytes,UA: NEGATIVE
Nitrite, UA: NEGATIVE
Protein,UA: NEGATIVE
RBC, UA: NEGATIVE
Specific Gravity, UA: 1.02 (ref 1.005–1.030)
Urobilinogen, Ur: 0.2 mg/dL (ref 0.2–1.0)
pH, UA: 6.5 (ref 5.0–7.5)

## 2023-10-16 LAB — MICROSCOPIC EXAMINATION: RBC, Urine: NONE SEEN /HPF (ref 0–2)

## 2023-10-16 NOTE — Progress Notes (Signed)
   Assessment: 1. Elevated PSA   2. Lower urinary tract symptoms (LUTS)     Plan: Patient will continue tamsulosin. Follow-up 6 months with PSA prior to visit Continue sildenafil and testosterone replacement therapy per PCP  Chief Complaint: Elevated psa  HPI: Randy Orr is a 73 y.o. male who presents for continued evaluation of BPH/lower urinary tract symptoms and elevated PSA. Patient also has ED that responds well to sildenafil and has hypogonadism and is treated by PCP with testosterone injections. Please see my note 08/21/2023 time of initial visit for detailed history and exam. Patient was started on tamsulosin at that time with plans for short interval follow-up and repeat PSA testing.  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  Current IPSS = 1.  Repeat PSA 09/2023 = 3.5 (23% free)    Portions of the above documentation were copied from a prior visit for review purposes only.  Allergies: Allergies  Allergen Reactions   Atorvastatin Calcium Other (See Comments)    MYALGIAS   Pravastatin Sodium Other (See Comments)    LEG CRAMPS   Rosuvastatin Calcium Other (See Comments)    MYALGIA   Statins Other (See Comments)    We have tried several, cannot tolerate  - myalgias    PMH: Past Medical History:  Diagnosis Date   Diabetes mellitus without complication (HCC)    DM type 2   GERD (gastroesophageal reflux disease)    Hyperlipidemia    Low testosterone    Obesity 05/17/2014   Peripheral sensory neuropathy due to type 2 diabetes mellitus (HCC) 05/17/2014   Snoring 05/17/2014    PSH: Past Surgical History:  Procedure Laterality Date   COLONOSCOPY  07/18/2014    SH: Social History   Tobacco Use   Smoking status: Former   Smokeless tobacco: Never   Tobacco comments:    smoked 30 years ago.  Substance Use Topics   Alcohol use: Yes    Alcohol/week: 5.0 standard drinks of alcohol    Types: 5 Glasses of wine per week   Drug use: No     ROS: Constitutional:  Negative for fever, chills, weight loss CV: Negative for chest pain, previous MI, hypertension Respiratory:  Negative for shortness of breath, wheezing, sleep apnea, frequent cough GI:  Negative for nausea, vomiting, bloody stool, GERD  PE: BP 137/81   Pulse 77   Ht 6\' 3"  (1.905 m)   Wt 213 lb (96.6 kg)   BMI 26.62 kg/m  GENERAL APPEARANCE:  Well appearing, well developed, well nourished, NAD    Results: UA neg

## 2023-10-17 ENCOUNTER — Ambulatory Visit (HOSPITAL_BASED_OUTPATIENT_CLINIC_OR_DEPARTMENT_OTHER)
Admission: RE | Admit: 2023-10-17 | Discharge: 2023-10-17 | Disposition: A | Discharge status: Home / Self Care | Payer: Medicare HMO | Source: Ambulatory Visit | Attending: Pulmonary Disease | Admitting: Pulmonary Disease

## 2023-10-17 DIAGNOSIS — R911 Solitary pulmonary nodule: Secondary | ICD-10-CM | POA: Insufficient documentation

## 2023-10-17 DIAGNOSIS — I700 Atherosclerosis of aorta: Secondary | ICD-10-CM | POA: Diagnosis not present

## 2023-10-17 DIAGNOSIS — E041 Nontoxic single thyroid nodule: Secondary | ICD-10-CM | POA: Diagnosis not present

## 2023-10-20 ENCOUNTER — Encounter (HOSPITAL_BASED_OUTPATIENT_CLINIC_OR_DEPARTMENT_OTHER): Payer: Self-pay

## 2023-10-20 ENCOUNTER — Other Ambulatory Visit (HOSPITAL_BASED_OUTPATIENT_CLINIC_OR_DEPARTMENT_OTHER): Payer: Self-pay

## 2023-10-20 ENCOUNTER — Ambulatory Visit: Payer: Medicare HMO | Admitting: Primary Care

## 2023-10-20 ENCOUNTER — Telehealth: Payer: Self-pay

## 2023-10-20 DIAGNOSIS — H5789 Other specified disorders of eye and adnexa: Secondary | ICD-10-CM | POA: Diagnosis not present

## 2023-10-20 DIAGNOSIS — E782 Mixed hyperlipidemia: Secondary | ICD-10-CM

## 2023-10-20 DIAGNOSIS — J3489 Other specified disorders of nose and nasal sinuses: Secondary | ICD-10-CM | POA: Diagnosis not present

## 2023-10-20 DIAGNOSIS — Z7189 Other specified counseling: Secondary | ICD-10-CM

## 2023-10-20 NOTE — Progress Notes (Deleted)
 @Patient  ID: Randy Orr, male    DOB: [birth year], 73 y.o.   MRN: [id number]  No chief complaint on file.   Referring provider: Pearline Cables, MD  HPI:   Allergies  Allergen Reactions   Atorvastatin Calcium Other (See Comments)    MYALGIAS   Pravastatin Sodium Other (See Comments)    LEG CRAMPS   Rosuvastatin Calcium Other (See Comments)    MYALGIA   Statins Other (See Comments)    We have tried several, cannot tolerate  - myalgias    Immunization History  Administered Date(s) Administered   Influenza Split 04/10/2023   Influenza, High Dose Seasonal PF 06/06/2018, 04/27/2019   Influenza,inj,Quad PF,6+ Mos 05/16/2014   Influenza-Unspecified 05/09/2020, 04/12/2021, 04/26/2022   Moderna Sars-Covid-2 Vaccination 04/24/2022, 10/25/2022   PFIZER Comirnaty(Gray Top)Covid-19 Tri-Sucrose Vaccine 11/13/2020   PFIZER(Purple Top)SARS-COV-2 Vaccination 09/03/2019, 09/24/2019, 05/09/2020   PPD Test 09/23/2011   Pfizer Covid-19 Vaccine Bivalent Booster 18yrs & up 05/12/2021, 05/02/2022   Pneumococcal Conjugate-13 10/08/2018   Pneumococcal Polysaccharide-23 10/20/2013, 06/19/2020   Rsv, Bivalent, Protein Subunit Rsvpref,pf Verdis Frederickson) 04/26/2022   Tdap 10/20/2013   Unspecified SARS-COV-2 Vaccination 04/10/2023   Zoster Recombinant(Shingrix) 06/05/2020, 08/21/2020    Past Medical History:  Diagnosis Date   Diabetes mellitus without complication (HCC)    DM type 2   GERD (gastroesophageal reflux disease)    Hyperlipidemia    Low testosterone    Obesity 05/17/2014   Peripheral sensory neuropathy due to type 2 diabetes mellitus (HCC) 05/17/2014   Snoring 05/17/2014    Tobacco History: Social History   Tobacco Use  Smoking Status Former  Smokeless Tobacco Never  Tobacco Comments   smoked 30 years ago.   Counseling given: Not Answered Tobacco comments: smoked 30 years ago.   Outpatient Medications Prior to Visit  Medication Sig Dispense Refill   ALPRAZolam (XANAX)  0.5 MG tablet Take 0.5-1 tablets (0.25-0.5 mg total) by mouth 2 (two) times daily as needed for anxiety. 20 tablet 1   cyclobenzaprine (FLEXERIL) 10 MG tablet Take 1 tablet (10 mg total) by mouth daily as needed for muscle spasms. 30 tablet 0   Evolocumab (REPATHA SURECLICK) 140 MG/ML SOAJ INJECT 140 MG INTO THE SKIN EVERY 14 (FOURTEEN) DAYS. 6 mL 1   ezetimibe (ZETIA) 10 MG tablet Take 1 tablet (10 mg total) by mouth daily. 90 tablet 3   l-methylfolate-B6-B12 (METANX) 3-35-2 MG TABS Take 1 tablet by mouth daily. 60 tablet 1   metFORMIN (GLUCOPHAGE-XR) 500 MG 24 hr tablet TAKE 2 TABLETS BY MOUTH EVERY DAY WITH BREAKFAST 180 tablet 3   Multiple Vitamin (MULTIVITAMIN) tablet Take 1 tablet by mouth daily.     Multiple Vitamins-Minerals (PRESERVISION AREDS 2 PO) Take 1 tablet by mouth in the morning and at bedtime.     Needle, Disp, (HYPODERMIC NEEDLE 23GX1") 23G X 1" MISC Use to give IM injection as directed 50 each 1   sildenafil (VIAGRA) 50 MG tablet Take 50 mg by mouth as needed for erectile dysfunction.     tamsulosin (FLOMAX) 0.4 MG CAPS capsule Take 1 capsule (0.4 mg total) by mouth daily after supper. 90 capsule 3   testosterone cypionate (DEPOTESTOSTERONE CYPIONATE) 200 MG/ML injection INJECT 0.75 ML(150mg ) INTRAMUSCULARLY EVERY 14 DAYS. 6 mL 0   tirzepatide (MOUNJARO) 2.5 MG/0.5ML Pen Inject 2.5 mg into the skin once a week. 6 mL 1   tirzepatide (MOUNJARO) 5 MG/0.5ML Pen Inject 5 mg into the skin once a week. 6 mL 1   traZODone (DESYREL) 50  MG tablet TAKE 1/2 TO 1 TABLET BY MOUTH AT BEDTIME AS NEEDED FOR SLEEP 90 tablet 1   UNABLE TO FIND Apply topically in the morning and at bedtime. Med Name: TRIPLE ROSACEA CREAM AZELAIC ACID15%/METRONIDAZOLE 1%/IVERMECTIN 1%     No facility-administered medications prior to visit.      Review of Systems  Review of Systems   Physical Exam  There were no vitals taken for this visit. Physical Exam   Lab Results:  CBC    Component Value  Date/Time   WBC 6.7 06/18/2023 0854   RBC 5.19 06/18/2023 0854   HGB 16.4 06/18/2023 0854   HCT 49.4 06/18/2023 0854   PLT 236.0 06/18/2023 0854   MCV 95.2 06/18/2023 0854   MCV 90.7 03/01/2015 0931   MCH 30.7 03/01/2015 0931   MCH 31.3 05/16/2014 0845   MCHC 33.3 06/18/2023 0854   RDW 12.6 06/18/2023 0854    BMET    Component Value Date/Time   NA 137 06/18/2023 0854   K 4.4 06/18/2023 0854   CL 100 06/18/2023 0854   CO2 30 06/18/2023 0854   GLUCOSE 153 (H) 06/18/2023 0854   BUN 14 06/18/2023 0854   CREATININE 1.06 06/18/2023 0854   CREATININE 1.12 03/01/2015 0914   CALCIUM 9.4 06/18/2023 0854   GFRNONAA 71 03/01/2015 0914   GFRAA 82 03/01/2015 0914    BNP No results found for: "BNP"  ProBNP No results found for: "PROBNP"  Imaging: No results found.   Assessment & Plan:   No problem-specific Assessment & Plan notes found for this encounter.     Glenford Bayley, NP 10/20/2023

## 2023-10-20 NOTE — Telephone Encounter (Signed)
 I called radiology and told them to have the pts CT read ASAP, since the pt was scheduled to see Beth at 1:30 pm. I was unable to reach pt to inform him we would need to reschedule. Pt did not answer. Pt came in office and I verbally explained to pt that he could reschedule for next week and we would have results by then. NFN

## 2023-10-28 ENCOUNTER — Ambulatory Visit: Payer: Self-pay | Admitting: Family Medicine

## 2023-10-28 ENCOUNTER — Encounter: Payer: Self-pay | Admitting: Family Medicine

## 2023-10-28 ENCOUNTER — Ambulatory Visit (INDEPENDENT_AMBULATORY_CARE_PROVIDER_SITE_OTHER): Admitting: Family Medicine

## 2023-10-28 VITALS — BP 136/70 | HR 83 | Temp 99.1°F | Resp 18 | Ht 75.0 in | Wt 218.0 lb

## 2023-10-28 DIAGNOSIS — U071 COVID-19: Secondary | ICD-10-CM | POA: Diagnosis not present

## 2023-10-28 DIAGNOSIS — J0100 Acute maxillary sinusitis, unspecified: Secondary | ICD-10-CM

## 2023-10-28 MED ORDER — AMOXICILLIN-POT CLAVULANATE 875-125 MG PO TABS: 1.0000 | ORAL_TABLET | Freq: Two times a day (BID) | ORAL | 0 refills | Status: AC

## 2023-10-28 MED ORDER — PREDNISONE 20 MG PO TABS: 40.0000 mg | ORAL_TABLET | Freq: Every day | ORAL | 0 refills | Status: AC

## 2023-10-28 MED ORDER — BENZONATATE 200 MG PO CAPS: 200.0000 mg | ORAL_CAPSULE | Freq: Two times a day (BID) | ORAL | 0 refills | Status: DC | PRN

## 2023-10-28 NOTE — Telephone Encounter (Signed)
 Attempted to call patient regarding request- no answer- left message on VM for patient to call office    Copied from CRM [id number]. Topic: Clinical - Medication Question >> Oct 28, 2023  9:12 AM Isabell A wrote: Reason for CRM: Patient states he's been sick for over a week, he was seen at [facility]. Experiencing light fever & chills - he tested positive for Covid about 20 minutes ago & would like to know if he can get Paxlovid prescribed.   Callback number: [phone]

## 2023-10-28 NOTE — Patient Instructions (Addendum)
 Continue to push fluids, practice good hand hygiene, and cover your mouth if you cough.  If you start having fevers, shaking or shortness of breath, seek immediate care.  OK to take Tylenol 1000 mg (2 extra strength tabs) or 975 mg (3 regular strength tabs) every 6 hours as needed.  If no improvement in the next ~2 days, take the Augmentin.   Let us know if you need anything.

## 2023-10-28 NOTE — Telephone Encounter (Signed)
 Chief Complaint: COVID + Symptoms: Sinus pressure, fever, mild SOB, dry cough, fatigue, aches Frequency: 9 days Pertinent Negatives: Patient denies relief Disposition: [] ED /[] Urgent Care (no appt availability in office) / [x] Appointment(In office/virtual)/ []  [place] [facility]/ [] Home Care/ [] Refused Recommended Disposition /[] [facility]/ []  Follow-up with PCP Additional Notes: Patient called in to report a positive COVID test taken at home this morning. Patient stated he has been experiencing symptoms for 9 days. Patient is experiencing sinus pressure and drainage, low grade fever, mild SOB, dry cough, fatigue, headache and body aches. Patient was able to speak in clear and complete sentences while on the phone with this RN. No audible work of breathing or wheezing detected. This RN advised patient to see a provider within 4 hours, per protocol. No availability with PCP. This RN scheduled patient with an alternate provider win the office today. This RN advised patient to call back if anything changes or worsens. Patient complied.   Reason for Disposition  MILD difficulty breathing (e.g., minimal/no SOB at rest, SOB with walking, pulse <100)  Answer Assessment - Initial Assessment Questions 1. COVID-19 DIAGNOSIS: "How do you know that you have COVID?" (e.g., positive lab test or self-test, diagnosed by doctor or NP/PA, symptoms after exposure).     Home test today 2. COVID-19 EXPOSURE: "Was there any known exposure to COVID before the symptoms began?" CDC Definition of close contact: within 6 feet (2 meters) for a total of 15 minutes or more over a 24-hour period.      Denies 3. ONSET: "When did the COVID-19 symptoms start?"      Symptoms have been going on for 9 days 4. WORST SYMPTOM: "What is your worst symptom?" (e.g., cough, fever, shortness of breath, muscle aches)     Sinus pressure and drainage 5. COUGH: "Do you have a cough?" If Yes, ask: "How bad is the cough?"        Dry cough 6. FEVER: "Do you have a fever?" If Yes, ask: "What is your temperature, how was it measured, and when did it start?"     99.9 7. RESPIRATORY STATUS: "Describe your breathing?" (e.g., normal; shortness of breath, wheezing, unable to speak)      States he does not have stamina and is experiencing mild SOB, able to talk in clear and complete sentences while on the phone with this RN 8. BETTER-SAME-WORSE: "Are you getting better, staying the same or getting worse compared to yesterday?"  If getting worse, ask, "In what way?"     States sinus pressure has improved, but other symptoms are the same 9. OTHER SYMPTOMS: "Do you have any other symptoms?"  (e.g., chills, fatigue, headache, loss of smell or taste, muscle pain, sore throat)     Sinus pressure, fatigue, headache, body aches, chills  10. HIGH RISK DISEASE: "Do you have any chronic medical problems?" (e.g., asthma, heart or lung disease, weak immune system, obesity, etc.)       DM II 11. VACCINE: "Have you had the COVID-19 vaccine?" If Yes, ask: "Which one, how many shots, when did you get it?"       Yes states he had the booster  13. O2 SATURATION MONITOR:  "Do you use an oxygen saturation monitor (pulse oximeter) at home?" If Yes, ask "What is your reading (oxygen level) today?" "What is your usual oxygen saturation reading?" (e.g., 95%)       Denies  Protocols used: Coronavirus (COVID-19) Diagnosed or Suspected-A-AH

## 2023-10-28 NOTE — Progress Notes (Signed)
 Chief Complaint  Patient presents with   Covid Positive    10/28/23 -- sx began: 1 week    Randy Orr here for URI complaints.  Duration: 9 days  Associated symptoms: Fever (99.8 F), sinus congestion, rhinorrhea, diarrhea, sore throat, myalgia, and dry cough Denies: itchy watery eyes, ear pain, ear drainage, N/V and wheezing Treatment to date: doxycycline, Mucinex, Afrin, Sudafed, ibuprofen Sick contacts: No Tested + for covid.   Past Medical History:  Diagnosis Date   Diabetes mellitus without complication (HCC)    DM type 2   GERD (gastroesophageal reflux disease)    Hyperlipidemia    Low testosterone    Obesity 05/17/2014   Peripheral sensory neuropathy due to type 2 diabetes mellitus (HCC) 05/17/2014   Snoring 05/17/2014    Objective BP 136/70   Pulse 83   Temp 99.1 F (37.3 C)   Resp 18   Ht 6\' 3"  (1.905 m)   Wt 218 lb (98.9 kg)   SpO2 100%   BMI 27.25 kg/m  General: Awake, alert, appears stated age HEENT: AT, [place], ears patent b/l and TM's neg, nares patent w/o discharge, pharynx pink and without exudates, MMM, +sinus ttp over max sinuses b/l Neck: No masses or asymmetry Heart: RRR Lungs: CTAB, no accessory muscle use Psych: Age appropriate judgment and insight, normal mood and affect  COVID-19 - Plan: predniSONE (DELTASONE) 20 MG tablet, benzonatate (TESSALON) 200 MG capsule  Acute maxillary sinusitis, recurrence not specified - Plan: amoxicillin-clavulanate (AUGMENTIN) 875-125 MG tablet  Tessalon Perles prn. 5 d pred burst 40 mg/d. If no improvement in next 2-3 d, will take 7 d course of Augmentin for possible bacterial superinfection. Continue to push fluids, practice good hand hygiene, cover mouth when coughing.   F/u prn. If starting to experience fevers, shaking, or shortness of breath, seek immediate care. Pt voiced understanding and agreement to the plan.  Jilda Roche [place], DO 10/28/23 12:35 PM

## 2023-11-03 ENCOUNTER — Encounter: Payer: Self-pay | Admitting: Family Medicine

## 2023-11-05 DIAGNOSIS — E782 Mixed hyperlipidemia: Secondary | ICD-10-CM | POA: Diagnosis not present

## 2023-11-05 LAB — LIPID PANEL
Chol/HDL Ratio: 2.6 ratio (ref 0.0–5.0)
Cholesterol, Total: 107 mg/dL (ref 100–199)
HDL: 41 mg/dL (ref >39)
LDL Chol Calc (NIH): 26 mg/dL (ref 0–99)
Triglycerides: 270 mg/dL — ABNORMAL HIGH (ref 0–149)
VLDL Cholesterol Cal: 40 mg/dL (ref 5–40)

## 2023-11-05 LAB — HEPATIC FUNCTION PANEL
ALT: 60 IU/L — ABNORMAL HIGH (ref 0–44)
AST: 27 IU/L (ref 0–40)
Albumin: 3.9 g/dL (ref 3.9–4.9)
Alkaline Phosphatase: 80 IU/L (ref 44–121)
Bilirubin Total: 0.4 mg/dL (ref 0.0–1.2)
Bilirubin, Direct: 0.16 mg/dL (ref 0.00–0.40)
Total Protein: 5.8 g/dL — ABNORMAL LOW (ref 6.0–8.5)

## 2023-11-06 ENCOUNTER — Other Ambulatory Visit: Payer: Self-pay | Admitting: Family Medicine

## 2023-11-06 ENCOUNTER — Encounter: Payer: Self-pay | Admitting: Family Medicine

## 2023-11-06 DIAGNOSIS — E291 Testicular hypofunction: Secondary | ICD-10-CM

## 2023-11-10 ENCOUNTER — Encounter (HOSPITAL_BASED_OUTPATIENT_CLINIC_OR_DEPARTMENT_OTHER): Payer: Self-pay

## 2023-11-14 ENCOUNTER — Other Ambulatory Visit: Payer: Self-pay | Admitting: Family Medicine

## 2023-11-14 DIAGNOSIS — F5102 Adjustment insomnia: Secondary | ICD-10-CM

## 2023-11-21 ENCOUNTER — Other Ambulatory Visit: Payer: Self-pay | Admitting: Family Medicine

## 2023-11-21 DIAGNOSIS — E785 Hyperlipidemia, unspecified: Secondary | ICD-10-CM

## 2023-11-30 ENCOUNTER — Encounter: Payer: Self-pay | Admitting: Family Medicine

## 2023-12-03 ENCOUNTER — Ambulatory Visit: Admitting: Primary Care

## 2023-12-03 ENCOUNTER — Encounter: Payer: Self-pay | Admitting: Primary Care

## 2023-12-03 VITALS — BP 110/78 | HR 87 | Ht 75.0 in | Wt 217.6 lb

## 2023-12-03 DIAGNOSIS — R911 Solitary pulmonary nodule: Secondary | ICD-10-CM

## 2023-12-03 DIAGNOSIS — Z87891 Personal history of nicotine dependence: Secondary | ICD-10-CM | POA: Diagnosis not present

## 2023-12-03 NOTE — Progress Notes (Signed)
 @Patient  ID: Colette Davies, male    DOB: 1951-01-13, 73 y.o.   MRN: [id number]  No chief complaint on file.   Referring provider: Copland, Skipper Dumas, MD  HPI:  This is a 73 year old gentleman, history of diabetes hyperlipidemia obesity.  He had a coronary calcium  scoring CT which found a incidental pulmonary nodule which led to CT imaging of the chest and thyroid  nodules being found was referred for thyroid  evaluation.  Pulmonary nodule was 1.4 cm smooth within the posterior segment of the right upper lobe.  Patient was referred for next best steps in evaluation.  Previous [facility] pulmonary encounter: OV 04/21/2023: Here today for follow-up patient had nuclear medicine PET scan on 04/07/2023.  Patient was found to have a 1.1 cm posterior right upper lobe nodule not significantly hypermetabolic but does favor morphology concerning for malignancy.  However is not diagnostic and would recommend close follow-up.   12/03/2023 Discussed the use of AI scribe software for clinical note transcription with the patient, who gave verbal consent to proceed.  History of Present Illness   Ademola Vert is a 73 year old male who presents for follow-up evaluation of a pulmonary nodule. Incidental pulmonary nodule, 1.4 cm smooth margins and a former smoker.  Patient had nuclear medicine pet imaging with no significant uptake.   A pulmonary nodule was incidentally discovered on a CT scan of the heart on June 06, 2022. The nodule is located in the right upper lobe and has not changed in size since its initial discovery. A PET scan conducted in August 2024 showed that the nodule was not hypermetabolic. Given his history of smoking, the nodule was monitored, but it has remained stable without any suspicious changes.  He experiences a dry cough primarily at night when lying down, which he attributes to postnasal drip or reflux. He takes potassium, magnesium, ezetimibe , and trazodone  at night. He also mentions not  drinking enough water during the day, which may contribute to dehydration and the dry cough. No mucus production, hemoptysis, or unexplained weight loss.  He quit smoking at the age of 30, approximately 40 years ago. There is a family history of smoking, as his father smoked until the age of 48 and died of a heart attack, but did not have cancer or significant respiratory issues.  He has a history of thyroid  nodules, one of which was biopsied and found to be benign.  He has a history of an ascending aortic aneurysm, initially measured at 4.2 cm, which has decreased to 3.9 cm on a recent scan. He is on Repatha  for high cholesterol, which has significantly lowered his cholesterol levels. He also takes Mounjaro  for diabetes management, aiming to reduce his A1c levels.      Imaging: 10/17/23 CT chest >>  Smoothly marginated 11 mm nodule in the posterior segment right upper lobe, unchanged from 01/14/2023 and 06/06/2022. Lungs are otherwise clear.  04/07/23 PET scan>> 1.1 cm posterior right upper lobe pulmonary nodule is not significantly hypermetabolic and is similar in size to the prior exam. This favors but is not diagnostic of a benign histology. Therefore, recommend chest CT follow-up at 6 months to confirm ongoing stability.  01/14/23 CTA>> 1.4 cm right upper lobe pulmonary nodule. Given persistence over 6 months, consider follow-up PET-CT or tissue sampling  06/06/22 CT cardiac>> Fusiform ascending thoracic aortic aneurysm measuring up to 4.2 cm. Recommend annual imaging followup by CTA or MRA.    Allergies  Allergen Reactions   Atorvastatin Calcium  Other (See Comments)  MYALGIAS   Pravastatin  Sodium Other (See Comments)    LEG CRAMPS   Rosuvastatin  Calcium  Other (See Comments)    MYALGIA   Statins Other (See Comments)    We have tried several, cannot tolerate  - myalgias    Immunization History  Administered Date(s) Administered   Influenza Split 04/10/2023   Influenza, High Dose  Seasonal PF 06/06/2018, 04/27/2019   Influenza,inj,Quad PF,6+ Mos 05/16/2014   Influenza-Unspecified 05/09/2020, 04/12/2021, 04/26/2022   Moderna Sars-Covid-2 Vaccination 04/24/2022, 10/25/2022   PFIZER Comirnaty(Gray Top)Covid-19 Tri-Sucrose Vaccine 11/13/2020   PFIZER(Purple Top)SARS-COV-2 Vaccination 09/03/2019, 09/24/2019, 05/09/2020   PPD Test 09/23/2011   Pfizer Covid-19 Vaccine Bivalent Booster 56yrs & up 05/12/2021, 05/02/2022   Pneumococcal Conjugate-13 10/08/2018   Pneumococcal Polysaccharide-23 10/20/2013, 06/19/2020   Rsv, Bivalent, Protein Subunit Rsvpref,pf Pattricia Bores) 04/26/2022   Tdap 10/20/2013   Unspecified SARS-COV-2 Vaccination 04/10/2023   Zoster Recombinant(Shingrix) 06/05/2020, 08/21/2020    Past Medical History:  Diagnosis Date   Diabetes mellitus without complication (HCC)    DM type 2   GERD (gastroesophageal reflux disease)    Hyperlipidemia    Low testosterone     Obesity 05/17/2014   Peripheral sensory neuropathy due to type 2 diabetes mellitus (HCC) 05/17/2014   Snoring 05/17/2014    Tobacco History: Social History   Tobacco Use  Smoking Status Former  Smokeless Tobacco Never  Tobacco Comments   smoked 30 years ago.   Counseling given: Not Answered Tobacco comments: smoked 30 years ago.   Outpatient Medications Prior to Visit  Medication Sig Dispense Refill   ALPRAZolam  (XANAX ) 0.5 MG tablet Take 0.5-1 tablets (0.25-0.5 mg total) by mouth 2 (two) times daily as needed for anxiety. 20 tablet 1   benzonatate  (TESSALON ) 200 MG capsule Take 1 capsule (200 mg total) by mouth 2 (two) times daily as needed for cough. 20 capsule 0   cyclobenzaprine  (FLEXERIL ) 10 MG tablet Take 1 tablet (10 mg total) by mouth daily as needed for muscle spasms. 30 tablet 0   Evolocumab  (REPATHA  SURECLICK) 140 MG/ML SOAJ INJECT 140 MG INTO THE SKIN EVERY 14 (FOURTEEN) DAYS. 6 mL 1   ezetimibe  (ZETIA ) 10 MG tablet Take 1 tablet (10 mg total) by mouth daily. 90 tablet 0    l-methylfolate-B6-B12 (METANX) 3-35-2 MG TABS Take 1 tablet by mouth daily. 60 tablet 1   metFORMIN  (GLUCOPHAGE -XR) 500 MG 24 hr tablet TAKE 2 TABLETS BY MOUTH EVERY DAY WITH BREAKFAST 180 tablet 3   Multiple Vitamin (MULTIVITAMIN) tablet Take 1 tablet by mouth daily.     Multiple Vitamins-Minerals (PRESERVISION AREDS 2 PO) Take 1 tablet by mouth in the morning and at bedtime.     Needle, Disp, (HYPODERMIC NEEDLE 23GX1") 23G X 1" MISC Use to give IM injection as directed 50 each 1   sildenafil  (VIAGRA ) 50 MG tablet Take 50 mg by mouth as needed for erectile dysfunction.     tamsulosin  (FLOMAX ) 0.4 MG CAPS capsule Take 1 capsule (0.4 mg total) by mouth daily after supper. 90 capsule 3   testosterone  cypionate (DEPOTESTOSTERONE CYPIONATE) 200 MG/ML injection INJECT 0.75 ML(150MG ) INTRAMUSCULARLY EVERY 14 DAYS. 6 mL 0   tirzepatide  (MOUNJARO ) 2.5 MG/0.5ML Pen Inject 2.5 mg into the skin once a week. 6 mL 1   tirzepatide  (MOUNJARO ) 5 MG/0.5ML Pen Inject 5 mg into the skin once a week. 6 mL 1   traZODone  (DESYREL ) 50 MG tablet Take 0.5-1 tablets (25-50 mg total) by mouth at bedtime as needed for sleep. 90 tablet 0   UNABLE  TO FIND Apply topically in the morning and at bedtime. Med Name: TRIPLE ROSACEA CREAM AZELAIC ACID15%/METRONIDAZOLE  1%/IVERMECTIN 1%     No facility-administered medications prior to visit.      Review of Systems  Review of Systems   Physical Exam  There were no vitals taken for this visit. Physical Exam   Lab Results:  CBC    Component Value Date/Time   WBC 6.7 06/18/2023 0854   RBC 5.19 06/18/2023 0854   HGB 16.4 06/18/2023 0854   HCT 49.4 06/18/2023 0854   PLT 236.0 06/18/2023 0854   MCV 95.2 06/18/2023 0854   MCV 90.7 03/01/2015 0931   MCH 30.7 03/01/2015 0931   MCH 31.3 05/16/2014 0845   MCHC 33.3 06/18/2023 0854   RDW 12.6 06/18/2023 0854    BMET    Component Value Date/Time   NA 137 06/18/2023 0854   K 4.4 06/18/2023 0854   CL 100 06/18/2023  0854   CO2 30 06/18/2023 0854   GLUCOSE 153 (H) 06/18/2023 0854   BUN 14 06/18/2023 0854   CREATININE 1.06 06/18/2023 0854   CREATININE 1.12 03/01/2015 0914   CALCIUM  9.4 06/18/2023 0854   GFRNONAA 71 03/01/2015 0914   GFRAA 82 03/01/2015 0914    BNP No results found for: "BNP"  ProBNP No results found for: "PROBNP"  Imaging: No results found.   Assessment & Plan:   1. Lung nodule (Primary)  2. Former smoker  Assessment and Plan    Pulmonary nodule Former smoker, quit smoking 40 years ago. Incidental pulmonary nodule originally seen on cardiac CT in October 2023. Right upper lobe nodule remains unchanged from 06/06/22, not hypermetabolic on PET scan and consistent with benign etiology. No biopsy needed. At this time, no further follow-up needs, will consult Dr. Byrum for monitoring needs.  Dilated ascending aorta Aorta decreased to 3.9 cm. No intervention unless >5 cm. Repatha  may contribute to improvement. - Monitor for symptoms: sudden chest pain, dyspnea.  Gastroesophageal reflux disease (GERD) Intermittent nocturnal dry cough possibly due to postnasal drip, reflux, or dehydration. - Consider positional changes and dietary modifications.  Type 2 diabetes mellitus Managed with Mounjaro  - Monitor A1c levels.  Hyperlipidemia Managed with Repatha , significantly lowering cholesterol. Discussed cardiovascular benefits. - Continue Repatha .      Antonio Baumgarten, NP 12/03/2023

## 2023-12-03 NOTE — Patient Instructions (Addendum)
 -  PULMONARY NODULE: A pulmonary nodule is a small, round growth in the lung. Your nodule in the right upper lobe has not changed in size and is benign based on the PET scan. No biopsy is needed at this time. We will consult with Dr. Baldwin Levee to determine the best monitoring plan for you.  -DILATED ASCENDING AORTA: A dilated ascending aorta is an enlargement of the upper part of the aorta. Your aorta has decreased in size to 3.9 cm. No intervention is needed unless it exceeds 5 cm. Please monitor for symptoms such as sudden chest pain or difficulty breathing.  -GASTROESOPHAGEAL REFLUX DISEASE (GERD): GERD is a condition where stomach acid frequently flows back into the tube connecting your mouth and stomach. Your nocturnal dry cough may be due to postnasal drip, reflux, or dehydration. Consider making positional changes while sleeping and dietary modifications to help manage this.   -TYPE 2 DIABETES MELLITUS: Type 2 diabetes is a condition that affects the way your body processes blood sugar. You are currently managing it with Mounjaro . If your A1c levels are above 7 and you tolerate the medication well, we may increase the dose. Our goal is to prevent damage to your small blood vessels. Please continue to monitor your A1c levels.  -HYPERLIPIDEMIA: Hyperlipidemia is having high levels of fats in the blood. You are managing this condition with Repatha , which has significantly lowered your cholesterol levels. This medication also provides cardiovascular benefits. Please continue taking Repatha  as prescribed.  Follow-up As needed at this time

## 2023-12-05 ENCOUNTER — Other Ambulatory Visit: Payer: Self-pay | Admitting: Cardiology

## 2023-12-05 DIAGNOSIS — I2510 Atherosclerotic heart disease of native coronary artery without angina pectoris: Secondary | ICD-10-CM

## 2023-12-05 DIAGNOSIS — E782 Mixed hyperlipidemia: Secondary | ICD-10-CM

## 2023-12-22 ENCOUNTER — Ambulatory Visit: Admitting: Family Medicine

## 2023-12-29 NOTE — Patient Instructions (Incomplete)
 It was great to see you again today!  I will be in touch with your ultrasound report and your A1c asap  Please see me in about 6 months assuming all is well

## 2023-12-29 NOTE — Progress Notes (Unsigned)
 [facility] at [facility] [address] [place], [place] [address] [phone]  Date:  01/07/2024   Name:  Randy Orr   DOB:  02-24-1951   MRN:  [id number]  PCP:  Kaylee Partridge, MD    Chief Complaint: No chief complaint on file.   History of Present Illness:  Randy Orr is a 73 y.o. very pleasant male patient who presents with the following:  Patient seen today for periodic follow-up Most recent visit with myself was in November  History of diabetes, peripheral neuropathy, hypogonadism on testosterone  replacement, hyperlipidemia He is unable to tolerate statins-is now using Repatha  as well as Zetia   We have been following a pulmonary nodule as well as a thyroid  nodule- Thyroid  nodule biopsy was negative, he is currently having annual ultrasound-due June 2025/next month  He was seen by pulmonology about a month ago: 1. Lung nodule (Primary) 2. Former smoker Pulmonary nodule Former smoker, quit smoking 40 years ago. Incidental pulmonary nodule originally seen on cardiac CT in October 2023. Right upper lobe nodule remains unchanged from 06/06/22, not hypermetabolic on PET scan and consistent with benign etiology. No biopsy needed. At this time, no further follow-up needs, will consult Dr. Byrum for monitoring needs. Dilated ascending aorta Aorta decreased to 3.9 cm. No intervention unless >5 cm. Repatha  may contribute to improvement. - Monitor for symptoms: sudden chest pain, dyspnea.  Lab Results  Component Value Date   HGBA1C 7.1 (H) 06/18/2023   Eye exam Tetanus Most recent COVID booster  Repatha  Zetia  Metformin  XR 1000 daily Viagra  as needed Testosterone  Mounjaro  5 Trazodone   Most recent testosterone  level in February-total looked good though bioavailable and free were mildly elevated.  Improved over previous PSA is up-to-date  His PSA did go up last year, he was seen by Dr. Del Favia in March-he recommended  a short-term PSA after starting tamsulosin  PSA measured at 3.5 in February  He has both cardiology and urology follow-up appointments pending  Lab Results  Component Value Date   PSA 4.47 (H) 07/30/2023   PSA 3.24 06/18/2023   PSA 2.54 12/04/2022     Patient Active Problem List   Diagnosis Date Noted   Thoracic aortic aneurysm (HCC) 06/10/2022   Acute pain of right knee 10/16/2021   Statin myopathy 09/27/2020   Left knee pain 06/06/2016   Peripheral sensory neuropathy due to type 2 diabetes mellitus (HCC) 05/17/2014   Snoring 05/17/2014   Obesity 05/17/2014   Diabetes mellitus (HCC) 09/23/2011   Hypogonadism male 09/23/2011   Hyperlipidemia 09/23/2011    Past Medical History:  Diagnosis Date   Diabetes mellitus without complication (HCC)    DM type 2   GERD (gastroesophageal reflux disease)    Hyperlipidemia    Low testosterone     Obesity 05/17/2014   Peripheral sensory neuropathy due to type 2 diabetes mellitus (HCC) 05/17/2014   Snoring 05/17/2014    Past Surgical History:  Procedure Laterality Date   COLONOSCOPY  07/18/2014    Social History   Tobacco Use   Smoking status: Former   Smokeless tobacco: Never   Tobacco comments:    smoked 30 years ago.  Substance Use Topics   Alcohol use: Yes    Alcohol/week: 5.0 standard drinks of alcohol    Types: 5 Glasses of wine per week   Drug use: No    Family History  Problem Relation Age of Onset   Heart attack Father    Diabetes  Brother    Atrial fibrillation Brother    Colon cancer Neg Hx    Esophageal cancer Neg Hx    Rectal cancer Neg Hx    Stomach cancer Neg Hx     Allergies  Allergen Reactions   Atorvastatin Calcium  Other (See Comments)    MYALGIAS   Pravastatin  Sodium Other (See Comments)    LEG CRAMPS   Rosuvastatin  Calcium  Other (See Comments)    MYALGIA   Statins Other (See Comments)    We have tried several, cannot tolerate  - myalgias    Medication list has been reviewed and  updated.  Current Outpatient Medications on File Prior to Visit  Medication Sig Dispense Refill   ALPRAZolam  (XANAX ) 0.5 MG tablet Take 0.5-1 tablets (0.25-0.5 mg total) by mouth 2 (two) times daily as needed for anxiety. 20 tablet 1   benzonatate  (TESSALON ) 200 MG capsule Take 1 capsule (200 mg total) by mouth 2 (two) times daily as needed for cough. 20 capsule 0   cyclobenzaprine  (FLEXERIL ) 10 MG tablet Take 1 tablet (10 mg total) by mouth daily as needed for muscle spasms. 30 tablet 0   Evolocumab  (REPATHA  SURECLICK) 140 MG/ML SOAJ INJECT 140 MG INTO THE SKIN EVERY 14 (FOURTEEN) DAYS. 6 mL 0   ezetimibe  (ZETIA ) 10 MG tablet Take 1 tablet (10 mg total) by mouth daily. 90 tablet 0   l-methylfolate-B6-B12 (METANX) 3-35-2 MG TABS Take 1 tablet by mouth daily. 60 tablet 1   metFORMIN  (GLUCOPHAGE -XR) 500 MG 24 hr tablet TAKE 2 TABLETS BY MOUTH EVERY DAY WITH BREAKFAST 180 tablet 3   Multiple Vitamin (MULTIVITAMIN) tablet Take 1 tablet by mouth daily.     Multiple Vitamins-Minerals (PRESERVISION AREDS 2 PO) Take 1 tablet by mouth in the morning and at bedtime.     Needle, Disp, (HYPODERMIC NEEDLE 23GX1") 23G X 1" MISC Use to give IM injection as directed 50 each 1   sildenafil  (VIAGRA ) 50 MG tablet Take 50 mg by mouth as needed for erectile dysfunction.     tamsulosin  (FLOMAX ) 0.4 MG CAPS capsule Take 1 capsule (0.4 mg total) by mouth daily after supper. 90 capsule 3   testosterone  cypionate (DEPOTESTOSTERONE CYPIONATE) 200 MG/ML injection INJECT 0.75 ML(150MG ) INTRAMUSCULARLY EVERY 14 DAYS. 6 mL 0   tirzepatide  (MOUNJARO ) 5 MG/0.5ML Pen Inject 5 mg into the skin once a week. 6 mL 1   traZODone  (DESYREL ) 50 MG tablet Take 0.5-1 tablets (25-50 mg total) by mouth at bedtime as needed for sleep. 90 tablet 0   UNABLE TO FIND Apply topically in the morning and at bedtime. Med Name: TRIPLE ROSACEA CREAM AZELAIC ACID15%/METRONIDAZOLE  1%/IVERMECTIN 1%     No current facility-administered medications on file  prior to visit.    Review of Systems:  As per HPI- otherwise negative.   Physical Examination: There were no vitals filed for this visit. There were no vitals filed for this visit. There is no height or weight on file to calculate BMI. Ideal Body Weight:    GEN: no acute distress. HEENT: Atraumatic, Normocephalic.  Ears and Nose: No external deformity. CV: RRR, No M/G/R. No JVD. No thrill. No extra heart sounds. PULM: CTA B, no wheezes, crackles, rhonchi. No retractions. No resp. distress. No accessory muscle use. ABD: S, NT, ND, +BS. No rebound. No HSM. EXTR: No c/c/e PSYCH: Normally interactive. Conversant.    Assessment and Plan: ***  Signed Gates Kasal, MD

## 2024-01-07 ENCOUNTER — Ambulatory Visit: Admitting: Family Medicine

## 2024-01-07 ENCOUNTER — Ambulatory Visit: Payer: Self-pay | Admitting: Family Medicine

## 2024-01-07 ENCOUNTER — Encounter: Payer: Self-pay | Admitting: Family Medicine

## 2024-01-07 VITALS — BP 128/70 | HR 70 | Temp 97.6°F | Ht 75.0 in | Wt 216.0 lb

## 2024-01-07 DIAGNOSIS — Z7985 Long-term (current) use of injectable non-insulin antidiabetic drugs: Secondary | ICD-10-CM | POA: Diagnosis not present

## 2024-01-07 DIAGNOSIS — E119 Type 2 diabetes mellitus without complications: Secondary | ICD-10-CM | POA: Diagnosis not present

## 2024-01-07 DIAGNOSIS — R972 Elevated prostate specific antigen [PSA]: Secondary | ICD-10-CM

## 2024-01-07 DIAGNOSIS — E291 Testicular hypofunction: Secondary | ICD-10-CM | POA: Diagnosis not present

## 2024-01-07 DIAGNOSIS — Z7984 Long term (current) use of oral hypoglycemic drugs: Secondary | ICD-10-CM | POA: Diagnosis not present

## 2024-01-07 DIAGNOSIS — E785 Hyperlipidemia, unspecified: Secondary | ICD-10-CM | POA: Diagnosis not present

## 2024-01-07 LAB — COMPREHENSIVE METABOLIC PANEL WITH GFR
ALT: 42 U/L (ref 0–53)
AST: 27 U/L (ref 0–37)
Albumin: 4.6 g/dL (ref 3.5–5.2)
Alkaline Phosphatase: 56 U/L (ref 39–117)
BUN: 17 mg/dL (ref 6–23)
CO2: 33 meq/L — ABNORMAL HIGH (ref 19–32)
Calcium: 9.7 mg/dL (ref 8.4–10.5)
Chloride: 99 meq/L (ref 96–112)
Creatinine, Ser: 1.1 mg/dL (ref 0.40–1.50)
GFR: 67.89 mL/min (ref >60.00)
Glucose, Bld: 156 mg/dL — ABNORMAL HIGH (ref 70–99)
Potassium: 4.4 meq/L (ref 3.5–5.1)
Sodium: 137 meq/L (ref 135–145)
Total Bilirubin: 0.7 mg/dL (ref 0.2–1.2)
Total Protein: 6.7 g/dL (ref 6.0–8.3)

## 2024-01-08 ENCOUNTER — Encounter: Payer: Self-pay | Admitting: Family Medicine

## 2024-01-08 ENCOUNTER — Other Ambulatory Visit (INDEPENDENT_AMBULATORY_CARE_PROVIDER_SITE_OTHER)

## 2024-01-08 ENCOUNTER — Ambulatory Visit (HOSPITAL_BASED_OUTPATIENT_CLINIC_OR_DEPARTMENT_OTHER)
Admission: RE | Admit: 2024-01-08 | Discharge: 2024-01-08 | Disposition: A | Discharge status: Home / Self Care | Source: Ambulatory Visit | Attending: Family Medicine | Admitting: Family Medicine

## 2024-01-08 ENCOUNTER — Other Ambulatory Visit: Payer: Self-pay | Admitting: Family Medicine

## 2024-01-08 DIAGNOSIS — E119 Type 2 diabetes mellitus without complications: Secondary | ICD-10-CM | POA: Diagnosis not present

## 2024-01-08 DIAGNOSIS — E042 Nontoxic multinodular goiter: Secondary | ICD-10-CM | POA: Diagnosis not present

## 2024-01-08 LAB — HEMOGLOBIN A1C: Hgb A1c MFr Bld: 8 % — ABNORMAL HIGH (ref 4.6–6.5)

## 2024-01-08 MED ORDER — TIRZEPATIDE 7.5 MG/0.5ML ~~LOC~~ SOAJ
7.5000 mg | SUBCUTANEOUS | 1 refills | Status: AC
Start: 2024-01-08 — End: ?

## 2024-01-13 ENCOUNTER — Encounter: Payer: Self-pay | Admitting: Family Medicine

## 2024-01-13 DIAGNOSIS — E041 Nontoxic single thyroid nodule: Secondary | ICD-10-CM

## 2024-01-13 LAB — HM AWV

## 2024-01-14 DIAGNOSIS — H40003 Preglaucoma, unspecified, bilateral: Secondary | ICD-10-CM | POA: Diagnosis not present

## 2024-01-15 NOTE — Addendum Note (Signed)
 Addended by: Marigene Shoulder on: 01/15/2024 10:10 AM   Modules accepted: Orders

## 2024-01-24 ENCOUNTER — Other Ambulatory Visit: Payer: Self-pay | Admitting: Family Medicine

## 2024-01-24 DIAGNOSIS — E119 Type 2 diabetes mellitus without complications: Secondary | ICD-10-CM

## 2024-01-26 ENCOUNTER — Other Ambulatory Visit: Payer: Self-pay | Admitting: Family Medicine

## 2024-01-26 DIAGNOSIS — E119 Type 2 diabetes mellitus without complications: Secondary | ICD-10-CM

## 2024-01-26 MED ORDER — TIRZEPATIDE 7.5 MG/0.5ML ~~LOC~~ SOAJ: 7.5000 mg | SUBCUTANEOUS | 1 refills | Status: DC

## 2024-02-02 ENCOUNTER — Other Ambulatory Visit: Payer: Self-pay | Admitting: Family Medicine

## 2024-02-02 DIAGNOSIS — E291 Testicular hypofunction: Secondary | ICD-10-CM

## 2024-02-03 ENCOUNTER — Encounter: Payer: Self-pay | Admitting: Family Medicine

## 2024-02-04 ENCOUNTER — Telehealth: Payer: Self-pay

## 2024-02-04 NOTE — Telephone Encounter (Signed)
 Called patient.  Looked around in [employer] and asked several employees.  No one has seen patients social security card or ID card.  Called patient.  Patient states he found his social security card but not the ID card (insurance ID card).  Patient states he has obtained a digital ID insurance card so he is okay at this time.  We can close this encounter.  NFN.

## 2024-02-04 NOTE — Telephone Encounter (Signed)
 Copied from CRM [id number]. Topic: General - Other >> Feb 04, 2024  7:49 AM Corean SAUNDERS wrote: Reason for CRM: Patient states he was seen by Almarie Ferrari recently and is missing his ID card and social security card. Please check and call to confirm with patient if he accidentally left the items at the clinic. >> Feb 04, 2024  9:13 AM Thersia RAMAN wrote: Nothing up front, can you check the back?   Epic says his LOV was in April? Im in [place] so I have not seen anything today

## 2024-02-06 DIAGNOSIS — H2513 Age-related nuclear cataract, bilateral: Secondary | ICD-10-CM | POA: Diagnosis not present

## 2024-02-06 DIAGNOSIS — H401132 Primary open-angle glaucoma, bilateral, moderate stage: Secondary | ICD-10-CM | POA: Diagnosis not present

## 2024-02-06 DIAGNOSIS — E119 Type 2 diabetes mellitus without complications: Secondary | ICD-10-CM | POA: Diagnosis not present

## 2024-02-11 ENCOUNTER — Other Ambulatory Visit: Payer: Self-pay | Admitting: Family Medicine

## 2024-02-11 DIAGNOSIS — F5102 Adjustment insomnia: Secondary | ICD-10-CM

## 2024-02-14 ENCOUNTER — Other Ambulatory Visit: Payer: Self-pay | Admitting: Family Medicine

## 2024-02-14 DIAGNOSIS — E785 Hyperlipidemia, unspecified: Secondary | ICD-10-CM

## 2024-02-18 ENCOUNTER — Encounter: Payer: Self-pay | Admitting: Family Medicine

## 2024-02-19 ENCOUNTER — Encounter: Payer: Self-pay | Admitting: Family Medicine

## 2024-02-21 ENCOUNTER — Other Ambulatory Visit: Payer: Self-pay | Admitting: Cardiology

## 2024-02-21 DIAGNOSIS — E782 Mixed hyperlipidemia: Secondary | ICD-10-CM

## 2024-02-21 DIAGNOSIS — I2510 Atherosclerotic heart disease of native coronary artery without angina pectoris: Secondary | ICD-10-CM

## 2024-03-09 ENCOUNTER — Other Ambulatory Visit (HOSPITAL_BASED_OUTPATIENT_CLINIC_OR_DEPARTMENT_OTHER): Payer: Self-pay | Admitting: *Deleted

## 2024-03-09 DIAGNOSIS — E782 Mixed hyperlipidemia: Secondary | ICD-10-CM

## 2024-03-09 DIAGNOSIS — E119 Type 2 diabetes mellitus without complications: Secondary | ICD-10-CM

## 2024-03-09 LAB — LIPID PANEL
Chol/HDL Ratio: 1.7 ratio (ref 0.0–5.0)
Cholesterol, Total: 82 mg/dL — ABNORMAL LOW (ref 100–199)
HDL: 47 mg/dL (ref >39)
LDL Chol Calc (NIH): 20 mg/dL (ref 0–99)
Triglycerides: 68 mg/dL (ref 0–149)
VLDL Cholesterol Cal: 15 mg/dL (ref 5–40)

## 2024-03-10 LAB — HEPATIC FUNCTION PANEL
ALT: 36 IU/L (ref 0–44)
AST: 29 IU/L (ref 0–40)
Albumin: 4.3 g/dL (ref 3.9–4.9)
Alkaline Phosphatase: 66 IU/L (ref 44–121)
Bilirubin Total: 0.4 mg/dL (ref 0.0–1.2)
Bilirubin, Direct: 0.19 mg/dL (ref 0.00–0.40)
Total Protein: 6.5 g/dL (ref 6.0–8.5)

## 2024-03-17 ENCOUNTER — Ambulatory Visit (HOSPITAL_BASED_OUTPATIENT_CLINIC_OR_DEPARTMENT_OTHER): Admitting: Cardiology

## 2024-03-17 ENCOUNTER — Encounter (HOSPITAL_BASED_OUTPATIENT_CLINIC_OR_DEPARTMENT_OTHER): Payer: Self-pay | Admitting: Cardiology

## 2024-03-17 VITALS — BP 122/80 | HR 69 | Ht 75.0 in | Wt 211.0 lb

## 2024-03-17 DIAGNOSIS — T466X5D Adverse effect of antihyperlipidemic and antiarteriosclerotic drugs, subsequent encounter: Secondary | ICD-10-CM

## 2024-03-17 DIAGNOSIS — Q2549 Other congenital malformations of aorta: Secondary | ICD-10-CM

## 2024-03-17 DIAGNOSIS — I441 Atrioventricular block, second degree: Secondary | ICD-10-CM | POA: Insufficient documentation

## 2024-03-17 DIAGNOSIS — Z712 Person consulting for explanation of examination or test findings: Secondary | ICD-10-CM | POA: Diagnosis not present

## 2024-03-17 DIAGNOSIS — M791 Myalgia, unspecified site: Secondary | ICD-10-CM | POA: Insufficient documentation

## 2024-03-17 DIAGNOSIS — Z7189 Other specified counseling: Secondary | ICD-10-CM

## 2024-03-17 DIAGNOSIS — E119 Type 2 diabetes mellitus without complications: Secondary | ICD-10-CM

## 2024-03-17 DIAGNOSIS — E782 Mixed hyperlipidemia: Secondary | ICD-10-CM | POA: Diagnosis not present

## 2024-03-17 DIAGNOSIS — I2510 Atherosclerotic heart disease of native coronary artery without angina pectoris: Secondary | ICD-10-CM | POA: Insufficient documentation

## 2024-03-17 NOTE — Patient Instructions (Signed)
 Medication Instructions:  Your physician recommends that you continue on your current medications as directed. Please refer to the Current Medication list given to you today.  *If you need a refill on your cardiac medications before your next appointment, please call your pharmacy*  Lab Work: NONE  Testing/Procedures: Your physician has requested that you have an echocardiogram. Echocardiography is a painless test that uses sound waves to create images of your heart. It provides your doctor with information about the size and shape of your heart and how well your heart's chambers and valves are working. This procedure takes approximately one hour. There are no restrictions for this procedure. Please do NOT wear cologne, perfume, aftershave, or lotions (deodorant is allowed). Please arrive 15 minutes prior to your appointment time.  Please note: We ask at that you not bring children with you during ultrasound (echo/ vascular) testing. Due to room size and safety concerns, children are not allowed in the ultrasound rooms during exams. Our front office staff cannot provide observation of children in our lobby area while testing is being conducted. An adult accompanying a patient to their appointment will only be allowed in the ultrasound room at the discretion of the ultrasound technician under special circumstances. We apologize for any inconvenience.   Follow-Up: At [facility], you and your health needs are our priority.  As part of our continuing mission to provide you with exceptional heart care, we have created designated Provider Care Teams.  These Care Teams include your primary Cardiologist (physician) and Advanced Practice Providers (APPs -  Physician Assistants and Nurse Practitioners) who all work together to provide you with the care you need, when you need it.  We recommend signing up for the patient portal called MyChart.  Sign up information is provided on this After Visit  Summary.  MyChart is used to connect with patients for Virtual Visits (Telemedicine).  Patients are able to view lab/test results, encounter notes, upcoming appointments, etc.  Non-urgent messages can be sent to your provider as well.   To learn more about what you can do with MyChart, go to [email or web address].    Your next appointment:   12 month(s)  The format for your next appointment:   In Person  Provider:   Dr Lonni, Reche ORN NP, or Rosaline RAMAN NP

## 2024-03-17 NOTE — Progress Notes (Signed)
 Cardiology Office Note:  .   Date:  03/17/2024  ID:  Randy Orr, DOB Aug 02, 1951, MRN [id number] PCP: Watt [name], MD  [facility] Providers Cardiologist:  Shelda Bruckner, MD {  History of Present Illness: Randy   Jaquin Orr is a 73 y.o. male with a hx of HLD, DM, and statin intolerance (atorvastatin, rosuvastatin , pravastatin ) now on PCKS9i.  CV history: He had a calcium  scoring CT on 06/06/22 which showed dilated ascending aorta measuring 42mm and a total sore of 206. His father passed away at 25 y.o. from a MI and his brother has A-fib.    Today: Reviewed his CT images together today; reviewed stable 3.9 cm size ascending aorta, which is normal for BSA. Sinus of Valsalva were enlarged on CT angio; discussed echo and how this can also evaluate aortic valve.  Overall doing well. Looking to increase physical activity but has had limitations (weather, exercise equipment is packed). Recently went up on Mounjaro , having some side effects (mild GI) but tolerating well.  Reviewed lipids, LDL excellent.   ROS: Denies chest pain, shortness of breath at rest or with normal exertion. No PND, orthopnea, LE edema or unexpected weight gain. No syncope or palpitations. ROS otherwise negative except as noted.   Studies Reviewed: Randy    EKG:  EKG Interpretation Date/Time:  Wednesday March 17 2024 08:43:55 EDT Ventricular Rate:  69 PR Interval:    QRS Duration:  98 QT Interval:  380 QTC Calculation: 407 R Axis:   28  Text Interpretation: Sinus rhythm with 2nd degree A-V block (Mobitz I) No previous ECGs available Confirmed by Bruckner Shelda [phone]) on 03/17/2024 9:19:43 AM    CT angio 01/14/23 Aortic Root:  --Valve: 2.8 cm  --Sinuses: 4.5 cm  --Sinotubular Junction: 3.5 cm   Thoracic Aorta:  --Ascending Aorta: 3.9 cm  --Aortic Arch: 3.5 cm  --Descending Aorta: 2.7 cm  CT chest noncontrast 10/20/23 --Ascending aorta 3.9 cm (unchanged)  Physical Exam:   VS:  BP  122/80 (BP Location: Left Arm, Patient Position: Sitting, Cuff Size: Normal)   Pulse 69   Ht 6' 3 (1.905 m)   Wt 211 lb (95.7 kg)   BMI 26.37 kg/m    Wt Readings from Last 3 Encounters:  03/17/24 211 lb (95.7 kg)  01/07/24 216 lb (98 kg)  12/03/23 217 lb 9.6 oz (98.7 kg)    GEN: Well nourished, well developed in no acute distress HEENT: Normal, moist mucous membranes NECK: No JVD CARDIAC: regular rhythm, normal S1 and S2, no rubs or gallops. No murmur. VASCULAR: Radial and DP pulses 2+ bilaterally. No carotid bruits RESPIRATORY:  Clear to auscultation without rales, wheezing or rhonchi  ABDOMEN: Soft, non-tender, non-distended MUSCULOSKELETAL:  Ambulates independently SKIN: Warm and dry, no edema NEUROLOGIC:  Alert and oriented x 3. No focal neuro deficits noted. PSYCHIATRIC:  Normal affect    ASSESSMENT AND PLAN: .    Nonobstructive CAD Mixed hyperlipidemia Statin myalgia Type II diabetes Family history of heart disease -Calcium  score 206 in 2023 -lipids 02/2024: Tchol 82, TG 68, HDL 47, LDL 20 -no symptoms, active at baseline -doing well on PCSK9i, ezetimibe  -doing well on Mounjaro . Last A1c 8.0. Recently went up on dose from 5 mg to 7.5 mg weekly, mild symptoms with dose increase -reviewed red flag warning signs that need immediate medical attention   Concern for thoracic aortic aneurysm, actually sinus of valsalva dilation -stable on CT imaging 10/2023. Sinuses of Valsalva enlarge but rest of aorta is  not significantly dilated.  -given size/stability, does not need routine monitoring by CT. Will get echo to evaluate further, evaluate aortic valve and if any regurgitation present   Wenkebach (2nd degree AV type II) -asymptomatic, no further evaluation at this time  CV risk counseling and prevention -recommend heart healthy/Mediterranean diet, with whole grains, fruits, vegetable, fish, lean meats, nuts, and olive oil. Limit salt. -recommend moderate walking, 3-5  times/week for 30-50 minutes each session. Aim for at least 150 minutes.week. Goal should be pace of 3 miles/hours, or walking 1.5 miles in 30 minutes -recommend avoidance of tobacco products. Avoid excess alcohol.  Dispo: 1 year or sooner as needed  Signed, Shelda Bruckner, MD   Shelda Bruckner, MD, PhD, [facility]  [facility]  [place]  [facility] at [facility] at [facility] [address], [address] [place], [place] [address] [phone]

## 2024-03-22 DIAGNOSIS — E119 Type 2 diabetes mellitus without complications: Secondary | ICD-10-CM | POA: Diagnosis not present

## 2024-03-22 DIAGNOSIS — H401132 Primary open-angle glaucoma, bilateral, moderate stage: Secondary | ICD-10-CM | POA: Diagnosis not present

## 2024-03-22 DIAGNOSIS — H401122 Primary open-angle glaucoma, left eye, moderate stage: Secondary | ICD-10-CM | POA: Diagnosis not present

## 2024-03-22 DIAGNOSIS — H2513 Age-related nuclear cataract, bilateral: Secondary | ICD-10-CM | POA: Diagnosis not present

## 2024-04-05 ENCOUNTER — Other Ambulatory Visit: Payer: Self-pay | Admitting: Family Medicine

## 2024-04-05 DIAGNOSIS — F5102 Adjustment insomnia: Secondary | ICD-10-CM

## 2024-04-09 ENCOUNTER — Ambulatory Visit (HOSPITAL_BASED_OUTPATIENT_CLINIC_OR_DEPARTMENT_OTHER)

## 2024-04-09 DIAGNOSIS — Q2549 Other congenital malformations of aorta: Secondary | ICD-10-CM | POA: Diagnosis not present

## 2024-04-09 DIAGNOSIS — I358 Other nonrheumatic aortic valve disorders: Secondary | ICD-10-CM

## 2024-04-09 LAB — ECHOCARDIOGRAM COMPLETE
AR max vel: 1.79 cm2
AV Area VTI: 1.66 cm2
AV Area mean vel: 1.68 cm2
AV Mean grad: 4.5 mmHg
AV Peak grad: 8.2 mmHg
AV Vena cont: 0.16 cm
Ao pk vel: 1.44 m/s
Area-P 1/2: 3.95 cm2
S' Lateral: 3.18 cm

## 2024-04-14 ENCOUNTER — Ambulatory Visit (HOSPITAL_BASED_OUTPATIENT_CLINIC_OR_DEPARTMENT_OTHER): Payer: Self-pay | Admitting: Cardiology

## 2024-04-16 ENCOUNTER — Encounter: Payer: Self-pay | Admitting: Family Medicine

## 2024-04-20 ENCOUNTER — Encounter: Payer: Self-pay | Admitting: Urology

## 2024-04-20 ENCOUNTER — Ambulatory Visit: Admitting: Urology

## 2024-04-20 VITALS — BP 127/78 | HR 72 | Ht 75.0 in | Wt 208.0 lb

## 2024-04-20 DIAGNOSIS — R399 Unspecified symptoms and signs involving the genitourinary system: Secondary | ICD-10-CM | POA: Insufficient documentation

## 2024-04-20 DIAGNOSIS — R972 Elevated prostate specific antigen [PSA]: Secondary | ICD-10-CM | POA: Insufficient documentation

## 2024-04-20 DIAGNOSIS — H40003 Preglaucoma, unspecified, bilateral: Secondary | ICD-10-CM | POA: Diagnosis not present

## 2024-04-20 LAB — URINALYSIS, ROUTINE W REFLEX MICROSCOPIC
Bilirubin, UA: NEGATIVE
Leukocytes,UA: NEGATIVE
Nitrite, UA: NEGATIVE
Protein,UA: NEGATIVE
RBC, UA: NEGATIVE
Specific Gravity, UA: 1.025 (ref 1.005–1.030)
Urobilinogen, Ur: 0.2 mg/dL (ref 0.2–1.0)
pH, UA: 6 (ref 5.0–7.5)

## 2024-04-20 LAB — MICROSCOPIC EXAMINATION

## 2024-04-20 NOTE — Progress Notes (Signed)
 Assessment: 1. Elevated PSA   2. Lower urinary tract symptoms (LUTS)     Plan: I personally reviewed the patient's chart including provider notes, lab results. Continue tamsulosin  PSA today I spent about 15 minutes discussing testosterone  replacement therapy with the patient today.  I discussed options including topical therapy, oral therapy, subcutaneous injections, subcutaneous pellets, and long-acting injections. Return to office in 6 months.   Chief Complaint: Chief Complaint  Patient presents with   Elevated PSA    HPI: Randy Orr is a 73 y.o. male who presents for continued evaluation of elevated PSA and BPH with lower urinary tract symptoms. He was previously followed by Dr. Shona and was last seen in March 2025.  He was seen for elevated PSA in January 2025. Patient has had routine PSA testing over the years and his PSA has remained very stable in the 2.5 range over the last several years until 06/2023 when it bumped up to 3.2 and when rechecked a month later was 4.47.  DRE from 1/25: 40 g gland without nodules or induration PSA 2/25: 3.5 with 23% free  The patient has been on testosterone  replacement therapy for over 10 years with IM injections. He is currently undergoing a dose reduction. He has been followed very closely for this as well. His levels over the last 6 months have been relatively high.  He is currently receiving 150 mg IM every 14 days.  He reported progressive worsening lower urinary tract symptoms gradually over the last several years. IPSS = 14.  He was started on tamsulosin  in January 2025.  He did note significant improvement in his voiding symptoms with the tamsulosin .  IPSS decreased to 1 in March 2025.  He returns today for follow-up.  He continues on tamsulosin .  His lower urinary tract symptoms are well-controlled.  He has nocturia 0-1 time per night.  No dysuria or gross hematuria. IPSS = 1/0. He continues on testosterone  replacement therapy  with injections every 2 weeks.  Portions of the above documentation were copied from a prior visit for review purposes only.  Allergies: Allergies  Allergen Reactions   Atorvastatin Calcium  Other (See Comments)    MYALGIAS   Pravastatin  Sodium Other (See Comments)    LEG CRAMPS   Rosuvastatin  Calcium  Other (See Comments)    MYALGIA   Statins Other (See Comments)    We have tried several, cannot tolerate  - myalgias    PMH: Past Medical History:  Diagnosis Date   Diabetes mellitus without complication (HCC)    DM type 2   GERD (gastroesophageal reflux disease)    Hyperlipidemia    Low testosterone     Obesity 05/17/2014   Peripheral sensory neuropathy due to type 2 diabetes mellitus (HCC) 05/17/2014   Snoring 05/17/2014    PSH: Past Surgical History:  Procedure Laterality Date   COLONOSCOPY  07/18/2014    SH: Social History   Tobacco Use   Smoking status: Former   Smokeless tobacco: Never   Tobacco comments:    smoked 30 years ago.  Substance Use Topics   Alcohol use: Yes    Alcohol/week: 5.0 standard drinks of alcohol    Types: 5 Glasses of wine per week   Drug use: No    ROS: Constitutional:  Negative for fever, chills, weight loss CV: Negative for chest pain, previous MI, hypertension Respiratory:  Negative for shortness of breath, wheezing, sleep apnea, frequent cough GI:  Negative for nausea, vomiting, bloody stool, GERD  PE: BP 127/78  Pulse 72   Ht 6' 3 (1.905 m)   Wt 208 lb (94.3 kg)   BMI 26.00 kg/m  GENERAL APPEARANCE:  Well appearing, well developed, well nourished, NAD HEENT:  Atraumatic, normocephalic, oropharynx clear NECK:  Supple without lymphadenopathy or thyromegaly ABDOMEN:  Soft, non-tender, no masses EXTREMITIES:  Moves all extremities well, without clubbing, cyanosis, or edema NEUROLOGIC:  Alert and oriented x 3, normal gait, CN II-XII grossly intact MENTAL STATUS:  appropriate BACK:  Non-tender to palpation, No CVAT SKIN:   Warm, dry, and intact   Results: U/A: 0-5 WBCs, 0-2 RBCs

## 2024-04-21 LAB — PSA: Prostate Specific Ag, Serum: 3.8 ng/mL (ref 0.0–4.0)

## 2024-04-22 ENCOUNTER — Ambulatory Visit: Payer: Self-pay | Admitting: Urology

## 2024-04-24 DIAGNOSIS — Z03818 Encounter for observation for suspected exposure to other biological agents ruled out: Secondary | ICD-10-CM | POA: Diagnosis not present

## 2024-04-24 DIAGNOSIS — R509 Fever, unspecified: Secondary | ICD-10-CM | POA: Diagnosis not present

## 2024-04-24 DIAGNOSIS — R52 Pain, unspecified: Secondary | ICD-10-CM | POA: Diagnosis not present

## 2024-04-24 DIAGNOSIS — J029 Acute pharyngitis, unspecified: Secondary | ICD-10-CM | POA: Diagnosis not present

## 2024-04-24 DIAGNOSIS — B349 Viral infection, unspecified: Secondary | ICD-10-CM | POA: Diagnosis not present

## 2024-04-24 DIAGNOSIS — R051 Acute cough: Secondary | ICD-10-CM | POA: Diagnosis not present

## 2024-04-29 ENCOUNTER — Other Ambulatory Visit: Payer: Self-pay | Admitting: Family Medicine

## 2024-04-29 ENCOUNTER — Ambulatory Visit: Payer: Self-pay

## 2024-04-29 ENCOUNTER — Encounter: Payer: Self-pay | Admitting: Family Medicine

## 2024-04-29 ENCOUNTER — Ambulatory Visit (INDEPENDENT_AMBULATORY_CARE_PROVIDER_SITE_OTHER): Admitting: Family Medicine

## 2024-04-29 VITALS — BP 127/78 | HR 81 | Temp 97.7°F | Ht 75.0 in | Wt 204.0 lb

## 2024-04-29 DIAGNOSIS — E291 Testicular hypofunction: Secondary | ICD-10-CM

## 2024-04-29 DIAGNOSIS — J0110 Acute frontal sinusitis, unspecified: Secondary | ICD-10-CM

## 2024-04-29 MED ORDER — AMOXICILLIN-POT CLAVULANATE 875-125 MG PO TABS: 1.0000 | ORAL_TABLET | Freq: Two times a day (BID) | ORAL | 0 refills | Status: AC

## 2024-04-29 MED ORDER — TESTOSTERONE CYPIONATE 200 MG/ML IM SOLN: INTRAMUSCULAR | 0 refills | Status: DC

## 2024-04-29 MED ORDER — PREDNISONE 20 MG PO TABS: 20.0000 mg | ORAL_TABLET | Freq: Every day | ORAL | 0 refills | Status: AC

## 2024-04-29 NOTE — Telephone Encounter (Signed)
 FYI Only or Action Required?: FYI only for provider.  Patient was last seen in primary care on 01/07/2024 by Copland, [name], MD.  Called Nurse Triage reporting Cough and Sinusitis.  Symptoms began 9-10 days.  Interventions attempted: Other: Seen at [facility] - medications.  Symptoms are: gradually improving.  Triage Disposition: See PCP When Office is Open (Within 3 Days), See Physician Within 24 Hours  Patient/caregiver understands and will follow disposition?: Yes                 Copied from CRM #[id number]. Topic: Clinical - Red Word Triage >> Apr 29, 2024  8:24 AM Adelita E wrote: [facility] that prompted transfer to Nurse Triage: Sinus infection symptoms. Patient has had  scratchy throat, a dry cough that lead to a productive cough, patient stated it is when he goes from sitting to standing, along with chest congestion. First symptom began 8 days ago. Reason for Disposition  [1] Using nasal washes and pain medicine > 24 hours AND [2] sinus pain (around cheekbone or eye) persists  [1] Sinus congestion (pressure, fullness) AND [2] present > 10 days  Answer Assessment - Initial Assessment Questions 1. ONSET: When did the cough begin?      8 days ago - on the 10th 2. SEVERITY: How bad is the cough today?      moderate 3. SPUTUM: Describe the color of your sputum (e.g., none, dry cough; clear, white, yellow, green)     White - not  4. HEMOPTYSIS: Are you coughing up any blood? If Yes, ask: How much? (e.g., flecks, streaks, tablespoons, etc.)     no 5. DIFFICULTY BREATHING: Are you having difficulty breathing? If Yes, ask: How bad is it? (e.g., mild, moderate, severe)      No -  6. FEVER: Do you have a fever? If Yes, ask: What is your temperature, how was it measured, and when did it start?     Had a fever - resolved 7. CARDIAC HISTORY: Do you have any history of heart disease? (e.g., heart attack, congestive heart failure)      no 8. LUNG HISTORY:  Do you have any history of lung disease?  (e.g., pulmonary embolus, asthma, emphysema)     no 10. OTHER SYMPTOMS: Do you have any other symptoms? (e.g., runny nose, wheezing, chest pain)       Sinus blocked, Weakness, reduced sense of smell  Answer Assessment - Initial Assessment Questions 1. LOCATION: Where does it hurt?      Sinus 2. ONSET: When did the sinus pain start?  (e.g., hours, days)      9 days 3. SEVERITY: How bad is the pain?   (Scale 0-10; or none, mild, moderate or severe)     mild 4. RECURRENT SYMPTOM: Have you ever had sinus problems before? If Yes, ask: When was the last time? and What happened that time?      yes 5. NASAL CONGESTION: Is the nose blocked? If Yes, ask: Can you open it or must you breathe through your mouth?     Yes - using afrin  7. FEVER: Do you have a fever? If Yes, ask: What is it, how was it measured, and when did it start?      resolved 8. OTHER SYMPTOMS: Do you have any other symptoms? (e.g., sore throat, cough, earache, difficulty breathing)     All other have resolved except cough  Protocols used: Cough - Acute Productive-A-AH, Sinus Pain or Congestion-A-AH

## 2024-04-29 NOTE — Progress Notes (Signed)
 Acute Office Visit  Subjective:     Patient ID: Randy Orr Reason, male    DOB: 02-21-1951, 73 y.o.   MRN: [id number]  Chief Complaint  Patient presents with   Sinus Problem    Sinus Problem   Patient is in today for sinus pain.  Discussed the use of AI scribe software for clinical note transcription with the patient, who gave verbal consent to proceed.  History of Present Illness Randy Orr is a 73 year old male who presents with upper respiratory symptoms and sinus pressure.  He has been experiencing symptoms for over a week, starting with a scratchy throat that progressed to a fever by Saturday. Tests for common infections at the [facility] were negative. He managed his fever with 650 mg Tylenol tablets, which he has since stopped. He describes significant sinus congestion, pressure, and headaches, along with a productive cough yielding white sputum. He reports significant congestion and sinus pressure.  He has been sleeping separately from his wife since Wednesday to avoid spreading illness. He reports poor sleep quality, characterized by shallow sleep and frequent awakenings due to coughing and congestion. He uses a prescription cough syrup containing promethazine DM at bedtime to aid sleep, though its effectiveness is uncertain. Afrin nasal spray has been used for congestion.  No known antibiotic allergies. He also mentions significant earwax buildup, particularly in the right ear, exacerbated by hearing aid use.           ROS All review of systems negative except what is listed in the HPI      Objective:    BP 127/78   Pulse 81   Temp 97.7 F (36.5 C) (Oral)   Ht 6' 3 (1.905 m)   Wt 204 lb (92.5 kg)   SpO2 98%   BMI 25.50 kg/m    Physical Exam Vitals reviewed.  Constitutional:      Appearance: Normal appearance.  HENT:     Head: Normocephalic and atraumatic.     Right Ear: There is impacted cerumen.     Left Ear: Tympanic membrane  normal.     Nose: Congestion present.     Mouth/Throat:     Mouth: Mucous membranes are moist.     Pharynx: Oropharynx is clear. Posterior oropharyngeal erythema present.  Eyes:     Conjunctiva/sclera: Conjunctivae normal.  Cardiovascular:     Rate and Rhythm: Normal rate and regular rhythm.     Heart sounds: Normal heart sounds.  Pulmonary:     Effort: Pulmonary effort is normal.     Breath sounds: Normal breath sounds.  Skin:    General: Skin is warm and dry.  Neurological:     Mental Status: He is alert and oriented to person, place, and time.  Psychiatric:        Mood and Affect: Mood normal.        Behavior: Behavior normal.        Thought Content: Thought content normal.        Judgment: Judgment normal.         No results found for any visits on 04/29/24.      Assessment & Plan:   Problem List Items Addressed This Visit       Active Problems   Hypogonadism male   Relevant Medications   testosterone  cypionate (DEPOTESTOSTERONE CYPIONATE) 200 MG/ML injection   Other Visit Diagnoses       Acute non-recurrent frontal sinusitis    -  Primary  Relevant Medications   amoxicillin -clavulanate (AUGMENTIN ) 875-125 MG tablet   predniSONE  (DELTASONE ) 20 MG tablet       Assessment & Plan Acute bacterial sinusitis with upper respiratory symptoms Symptoms suggest bacterial sinusitis with significant sinus pressure, headache, productive cough, and nasal congestion. Previous viral tests negative, lungs clear. - Prescribed amoxicillin -clavulanate (Augmentin ). - Consider prednisone  if severe sinus pressure persists after 2-3 doses of antibiotics. - Use saline nasal spray and Flonase  for congestion and inflammation. - Avoid Afrin for more than 3-4 days.  Impacted cerumen, right ear Right ear full of hard cerumen, potential hearing issues due to hearing aid use. - Cerumen removal in-office.        Indication: Cerumen impaction of the ear(s)  Medical  necessity statement: On physical examination, cerumen impairs clinically significant portions of the external auditory canal, and tympanic membrane. Noted obstructive, copious cerumen that cannot be removed without magnification and instrumentations requiring professional removal.   Consent: Discussed benefits and risks of procedure and verbal consent obtained  Procedure: Patient was prepped for the procedure. Otoscope utilized to assess and take note of the ear canal, the tympanic membrane, and the presence, amount, and placement of the cerumen.   Gentle irrigation with water at body temperature utilized to remove impacted cerumen.  Excess water drained by gravity and ear canal(s) dried with clean guaze.  Post procedure examination: Otoscopic examination reveals complete cerumen removal with no damage to the auditory canal, tympanic membrane, or surrounding tissue. Middle ear effusion noted.  Patient tolerated procedure well.   Post procedure instructions: Patient made aware that they may experience temporary vertigo, temporary changes in hearing, and temporary discomfort. If these symptom last for more than 24 hours to call the clinic or proceed to the ED for further evaluation. Discussed avoiding placing objects into the ear canal for cleaning.     Meds ordered this encounter  Medications   amoxicillin -clavulanate (AUGMENTIN ) 875-125 MG tablet    Sig: Take 1 tablet by mouth 2 (two) times daily for 7 days.    Dispense:  14 tablet    Refill:  0    Supervising Provider:   DOMENICA BLACKBIRD A [[id number]]   predniSONE  (DELTASONE ) 20 MG tablet    Sig: Take 1 tablet (20 mg total) by mouth daily with breakfast for 5 days.    Dispense:  5 tablet    Refill:  0    Supervising Provider:   DOMENICA BLACKBIRD A [[id number]]   testosterone  cypionate (DEPOTESTOSTERONE CYPIONATE) 200 MG/ML injection    Sig: INJECT 0.75 ML (150 MG) INTRAMUSCULARLY EVERY 14 DAYS.    Dispense:  6 mL    Refill:  0    Not to exceed 5  additional fills before 05/04/2024    Supervising Provider:   DOMENICA BLACKBIRD A [[id number]]      Return if symptoms worsen or fail to improve.  [name], NP

## 2024-04-29 NOTE — Telephone Encounter (Signed)
 Copied from CRM [id number]. Topic: Clinical - Medication Refill >> Apr 29, 2024  8:19 AM [place] E wrote: Medication: testosterone  cypionate (DEPOTESTOSTERONE CYPIONATE) 200 MG/ML   Has the patient contacted their pharmacy? Yes (Agent: If no, request that the patient contact the pharmacy for the refill. If patient does not wish to contact the pharmacy document the reason why and proceed with request.) (Agent: If yes, when and what did the pharmacy advise?)  This is the patient's preferred pharmacy:  [id number] [address] [facility] - [address] [place] [address] Phone: [phone] Fax: [phone]  Is this the correct pharmacy for this prescription? Yes If no, delete pharmacy and type the correct one.   Has the prescription been filled recently? No  Is the patient out of the medication? Yes  Has the patient been seen for an appointment in the last year OR does the patient have an upcoming appointment? Yes  Can we respond through MyChart? Yes  Agent: Please be advised that Rx refills may take up to 3 business days. We ask that you follow-up with your pharmacy.

## 2024-04-29 NOTE — Telephone Encounter (Signed)
 Duplicate request

## 2024-04-29 NOTE — Telephone Encounter (Signed)
 Appt today

## 2024-05-03 DIAGNOSIS — H401132 Primary open-angle glaucoma, bilateral, moderate stage: Secondary | ICD-10-CM | POA: Diagnosis not present

## 2024-05-03 DIAGNOSIS — H2513 Age-related nuclear cataract, bilateral: Secondary | ICD-10-CM | POA: Diagnosis not present

## 2024-05-03 DIAGNOSIS — H2511 Age-related nuclear cataract, right eye: Secondary | ICD-10-CM | POA: Diagnosis not present

## 2024-05-03 DIAGNOSIS — E119 Type 2 diabetes mellitus without complications: Secondary | ICD-10-CM | POA: Diagnosis not present

## 2024-05-04 ENCOUNTER — Ambulatory Visit (INDEPENDENT_AMBULATORY_CARE_PROVIDER_SITE_OTHER)

## 2024-05-04 VITALS — Ht 75.0 in | Wt 204.0 lb

## 2024-05-04 DIAGNOSIS — Z0000 Encounter for general adult medical examination without abnormal findings: Secondary | ICD-10-CM

## 2024-05-04 NOTE — Patient Instructions (Signed)
 Randy Orr,  Thank you for taking the time for your Medicare Wellness Visit. I appreciate your continued commitment to your health goals. Please review the care plan we discussed, and feel free to reach out if I can assist you further.  Medicare recommends these wellness visits once per year to help you and your care team stay ahead of potential health issues. These visits are designed to focus on prevention, allowing your provider to concentrate on managing your acute and chronic conditions during your regular appointments.  Please note that Annual Wellness Visits do not include a physical exam. Some assessments may be limited, especially if the visit was conducted virtually. If needed, we may recommend a separate in-person follow-up with your provider.  Ongoing Care Seeing your primary care provider every 3 to 6 months helps us  monitor your health and provide consistent, personalized care.   Referrals If a referral was made during today's visit and you haven't received any updates within two weeks, please contact the referred provider directly to check on the status.  Recommended Screenings:  Health Maintenance  Topic Date Due   Yearly kidney health urinalysis for diabetes  Never done   DTaP/Tdap/Td vaccine (3 - Td or Tdap) 10/21/2023   Flu Shot  03/12/2024   COVID-19 Vaccine (9 - Pfizer risk 2024-25 season) 04/12/2024   Colon Cancer Screening  08/19/2024   Complete foot exam   06/17/2024   Hemoglobin A1C  07/09/2024   Eye exam for diabetics  08/26/2024   Yearly kidney function blood test for diabetes  01/06/2025   Medicare Annual Wellness Visit  05/04/2025   Pneumococcal Vaccine for age over 56  Completed   Hepatitis C Screening  Completed   Zoster (Shingles) Vaccine  Completed   HPV Vaccine  Aged Out   Meningitis B Vaccine  Aged Out       05/04/2024    8:29 AM  Advanced Directives  Does Patient Have a Medical Advance Directive? No  Would patient like information on creating  a medical advance directive? Yes (MAU/Ambulatory/Procedural Areas - Information given)   Advance Care Planning is important because it: Ensures you receive medical care that aligns with your values, goals, and preferences. Provides guidance to your family and loved ones, reducing the emotional burden of decision-making during critical moments.  Information on Advanced Care Planning can be found at [place]  Secretary of [facility] Advance Health Care Directives Advance Health Care Directives ([email or web address]   Vision: Annual vision screenings are recommended for early detection of glaucoma, cataracts, and diabetic retinopathy. These exams can also reveal signs of chronic conditions such as diabetes and high blood pressure.  Dental: Annual dental screenings help detect early signs of oral cancer, gum disease, and other conditions linked to overall health, including heart disease and diabetes.  Please see the attached documents for additional preventive care recommendations.

## 2024-05-04 NOTE — Progress Notes (Signed)
 Subjective:   Randy Orr is a 73 y.o. who presents for a Medicare Wellness preventive visit.  As a reminder, Annual Wellness Visits don't include a physical exam, and some assessments may be limited, especially if this visit is performed virtually. We may recommend an in-person follow-up visit with your provider if needed.  Visit Complete: Virtual I connected with  Randy Orr on 05/04/24 by a audio enabled telemedicine application and verified that I am speaking with the correct person using two identifiers.  Patient Location: Home  Provider Location: Home Office  I discussed the limitations of evaluation and management by telemedicine. The patient expressed understanding and agreed to proceed.  Vital Signs: Because this visit was a virtual/telehealth visit, some criteria may be missing or patient reported. Any vitals not documented were not able to be obtained and vitals that have been documented are patient reported.  VideoDeclined- This patient declined Librarian, academic. Therefore the visit was completed with audio only.  Persons Participating in Visit: Patient.  AWV Questionnaire: Yes: Patient Medicare AWV questionnaire was completed by the patient on 04/27/24; I have confirmed that all information answered by patient is correct and no changes since this date.  Cardiac Risk Factors include: advanced age (>68men, >68 women);diabetes mellitus;dyslipidemia;male gender     Objective:    Today's Vitals   05/04/24 0825  Weight: 204 lb (92.5 kg)  Height: 6' 3 (1.905 m)   Body mass index is 25.5 kg/m.     05/04/2024    8:29 AM 06/20/2021    9:08 AM 07/04/2014    7:48 AM  Advanced Directives  Does Patient Have a Medical Advance Directive? No No No   Would patient like information on creating a medical advance directive? Yes (MAU/Ambulatory/Procedural Areas - Information given) No - Patient declined      Data saved with a previous flowsheet  row definition    Current Medications (verified) Outpatient Encounter Medications as of 05/04/2024  Medication Sig   ALPRAZolam  (XANAX ) 0.5 MG tablet TAKE 1/2 - 1 TABLET (0.25 - 0.5 MG TOTAL) BY MOUTH TWICE A DAY AS NEEDED FOR ANXIETY   amoxicillin -clavulanate (AUGMENTIN ) 875-125 MG tablet Take 1 tablet by mouth 2 (two) times daily for 7 days.   cyclobenzaprine  (FLEXERIL ) 10 MG tablet Take 1 tablet (10 mg total) by mouth daily as needed for muscle spasms.   Evolocumab  (REPATHA  SURECLICK) 140 MG/ML SOAJ INJECT 140 MG INTO THE SKIN EVERY 14 (FOURTEEN) DAYS.   ezetimibe  (ZETIA ) 10 MG tablet TAKE 1 TABLET BY MOUTH EVERY DAY   l-methylfolate-B6-B12 (METANX) 3-35-2 MG TABS Take 1 tablet by mouth daily.   metFORMIN  (GLUCOPHAGE -XR) 500 MG 24 hr tablet TAKE 2 TABLETS BY MOUTH EVERY DAY WITH BREAKFAST   Multiple Vitamin (MULTIVITAMIN) tablet Take 1 tablet by mouth daily.   Multiple Vitamins-Minerals (PRESERVISION AREDS 2 PO) Take 1 tablet by mouth in the morning and at bedtime.   Needle, Disp, (HYPODERMIC NEEDLE 23GX1) 23G X 1 MISC Use to give IM injection as directed   predniSONE  (DELTASONE ) 20 MG tablet Take 1 tablet (20 mg total) by mouth daily with breakfast for 5 days.   sildenafil  (VIAGRA ) 50 MG tablet Take 50 mg by mouth as needed for erectile dysfunction.   tamsulosin  (FLOMAX ) 0.4 MG CAPS capsule Take 1 capsule (0.4 mg total) by mouth daily after supper.   testosterone  cypionate (DEPOTESTOSTERONE CYPIONATE) 200 MG/ML injection INJECT 0.75 ML (150 MG) INTRAMUSCULARLY EVERY 14 DAYS.   tirzepatide  (MOUNJARO ) 7.5 MG/0.5ML Pen  Inject 7.5 mg into the skin once a week.   traZODone  (DESYREL ) 50 MG tablet Take 0.5-1 tablets (25-50 mg total) by mouth at bedtime as needed for sleep.   UNABLE TO FIND Apply topically in the morning and at bedtime. Med Name: TRIPLE ROSACEA CREAM AZELAIC ACID15%/METRONIDAZOLE  1%/IVERMECTIN 1%   No facility-administered encounter medications on file as of 05/04/2024.     Allergies (verified) Atorvastatin calcium , Pravastatin  sodium, Rosuvastatin  calcium , and Statins   History: Past Medical History:  Diagnosis Date   AAA (abdominal aortic aneurysm) 06/10/22   Cataract    Diabetes mellitus without complication (HCC)    DM type 2   GERD (gastroesophageal reflux disease)    Glaucoma    Hyperlipidemia    Low testosterone     Obesity 05/17/2014   Peripheral sensory neuropathy due to type 2 diabetes mellitus (HCC) 05/17/2014   Snoring 05/17/2014   Past Surgical History:  Procedure Laterality Date   COLONOSCOPY  07/18/2014   Family History  Problem Relation Age of Onset   Hearing loss Mother    Heart attack Father    Diabetes Brother    Atrial fibrillation Brother    Diabetes Brother    Heart disease Brother    Colon cancer Neg Hx    Esophageal cancer Neg Hx    Rectal cancer Neg Hx    Stomach cancer Neg Hx    Social History   Socioeconomic History   Marital status: Married    Spouse name: Not on file   Number of children: 1   Years of education: AAS   Highest education level: Bachelor's degree (e.g., BA, AB, BS)  Occupational History   Occupation: SECURITY OFFICER    Employer: [employer]  Tobacco Use   Smoking status: Former    Current packs/day: 0.00    Average packs/day: 0.5 packs/day for 5.0 years (2.5 ttl pk-yrs)    Types: Cigarettes    Quit date: 04/01/1983    Years since quitting: 41.1   Smokeless tobacco: Never   Tobacco comments:    smoked 30 years ago.  Substance and Sexual Activity   Alcohol use: Yes    Alcohol/week: 1.0 standard drink of alcohol    Types: 1 Cans of beer per week   Drug use: No   Sexual activity: Yes    Birth control/protection: None  Other Topics Concern   Not on file  Social History Narrative   Not on file   Social Drivers of Health   Financial Resource Strain: Low Risk  (04/27/2024)   Overall Financial Resource Strain (CARDIA)    Difficulty of Paying Living Expenses: Not hard at all   Food Insecurity: No Food Insecurity (04/27/2024)   Hunger Vital Sign    Worried About Running Out of Food in the Last Year: Never true    Ran Out of Food in the Last Year: Never true  Transportation Needs: No Transportation Needs (04/27/2024)   PRAPARE - Administrator, Civil Service (Medical): No    Lack of Transportation (Non-Medical): No  Physical Activity: Insufficiently Active (04/27/2024)   Exercise Vital Sign    Days of Exercise per Week: 3 days    Minutes of Exercise per Session: 30 min  Stress: No Stress Concern Present (04/27/2024)   [employer] of Occupational Health - Occupational Stress Questionnaire    Feeling of Stress: Only a little  Social Connections: Moderately Integrated (04/27/2024)   Social Connection and Isolation Panel    Frequency of Communication with  Friends and Family: More than three times a week    Frequency of Social Gatherings with Friends and Family: Once a week    Attends Religious Services: Never    Database administrator or Organizations: Yes    Attends Engineer, structural: More than 4 times per year    Marital Status: Married    Tobacco Counseling Counseling given: Not Answered Tobacco comments: smoked 30 years ago.    Clinical Intake:  Pre-visit preparation completed: Yes  Pain : No/denies pain  Diabetes: No  Lab Results  Component Value Date   HGBA1C 8.0 (H) 01/07/2024   HGBA1C 7.1 (H) 06/18/2023   HGBA1C 7.1 (H) 03/10/2023     How often do you need to have someone help you when you read instructions, pamphlets, or other written materials from your doctor or pharmacy?: 1 - Never  Interpreter Needed?: No  Information entered by :: Charmaine Bloodgood LPN   Activities of Daily Living     04/27/2024   10:27 AM  In your present state of health, do you have any difficulty performing the following activities:  Hearing? 0  Vision? 0  Difficulty concentrating or making decisions? 0  Walking or climbing  stairs? 0  Dressing or bathing? 0  Doing errands, shopping? 0  Preparing Food and eating ? N  Using the Toilet? N  In the past six months, have you accidently leaked urine? N  Do you have problems with loss of bowel control? N  Managing your Medications? N  Managing your Finances? N  Housekeeping or managing your Housekeeping? N    Patient Care Team: Copland, [name], MD as PCP - General (Family Medicine) Lonni Slain, MD as PCP - Cardiology (Cardiology) Carla Milling, RPH-CPP (Pharmacist) Austin Olam CROME, MD as Consulting Physician (Ophthalmology) Roseann, Adine PARAS., MD as Referring Physician (Urology) Joshua Rush, OD (Optometry)  I have updated your Care Teams any recent Medical Services you may have received from other providers in the past year.     Assessment:   This is a routine wellness examination for Randy Orr.  Hearing/Vision screen Hearing Screening - Comments:: Denies hearing difficulties    Vision Screening - Comments:: Wears rx glasses - up to date with routine eye exams with Dr. Joshua    Goals Addressed   None    Depression Screen     05/04/2024    8:27 AM 04/29/2024    3:42 PM 06/18/2023    8:41 AM 12/04/2022    8:20 AM 07/11/2022   11:16 AM 06/20/2021    9:18 AM 06/01/2020    9:39 AM  PHQ 2/9 Scores  PHQ - 2 Score 0 0 0 0 0 0 0  PHQ- 9 Score 3 3   1       Fall Risk     04/29/2024    3:42 PM 04/27/2024   10:27 AM 06/18/2023    8:41 AM 12/04/2022    8:20 AM 07/11/2022   11:16 AM  Fall Risk   Falls in the past year? 0 0 0 0 0  Number falls in past yr: 0 0 0 0 0  Injury with Fall? 0 0 0 0 0  Risk for fall due to : No Fall Risks No Fall Risks No Fall Risks No Fall Risks No Fall Risks  Follow up Falls evaluation completed Falls prevention discussed;Education provided;Falls evaluation completed Falls evaluation completed Falls evaluation completed Falls evaluation completed      Data saved with a previous  flowsheet row definition    MEDICARE  RISK AT HOME:  Medicare Risk at Home Any stairs in or around the home?: (Patient-Rptd) Yes If so, are there any without handrails?: (Patient-Rptd) No Home free of loose throw rugs in walkways, pet beds, electrical cords, etc?: (Patient-Rptd) Yes Adequate lighting in your home to reduce risk of falls?: (Patient-Rptd) Yes Life alert?: (Patient-Rptd) No Use of a cane, walker or w/c?: (Patient-Rptd) No Grab bars in the bathroom?: (Patient-Rptd) No Shower chair or bench in shower?: (Patient-Rptd) No Elevated toilet seat or a handicapped toilet?: (Patient-Rptd) No  TIMED UP AND GO:  Was the test performed?  No  Cognitive Function: 6CIT completed        05/04/2024    8:30 AM  6CIT Screen  What Year? 0 points  What month? 0 points  What time? 0 points  Count back from 20 0 points  Months in reverse 0 points  Repeat phrase 0 points  Total Score 0 points    Immunizations Immunization History  Administered Date(s) Administered    sv, Bivalent, Protein Subunit Rsvpref,pf (Abrysvo) 04/26/2022   INFLUENZA, HIGH DOSE SEASONAL PF 06/06/2018, 04/27/2019   Influenza Split 04/10/2023   Influenza,inj,Quad PF,6+ Mos 05/16/2014   Influenza-Unspecified 05/09/2020, 04/12/2021, 04/26/2022, 04/15/2024   Moderna Sars-Covid-2 Vaccination 04/24/2022, 10/25/2022   PFIZER Comirnaty(Gray Top)Covid-19 Tri-Sucrose Vaccine 11/13/2020   PFIZER(Purple Top)SARS-COV-2 Vaccination 09/03/2019, 09/24/2019, 05/09/2020   PPD Test 09/23/2011   Pfizer Covid-19 Vaccine Bivalent Booster 71yrs & up 05/12/2021, 05/02/2022   Pneumococcal Conjugate-13 10/08/2018   Pneumococcal Polysaccharide-23 10/20/2013, 06/19/2020   Tdap 04/08/2008, 10/20/2013   Unspecified SARS-COV-2 Vaccination 04/10/2023   Zoster Recombinant(Shingrix) 06/05/2020, 08/21/2020    Screening Tests Health Maintenance  Topic Date Due   Diabetic kidney evaluation - Urine ACR  Never done   DTaP/Tdap/Td (3 - Td or Tdap) 10/21/2023   COVID-19 Vaccine  (9 - Pfizer risk 2024-25 season) 04/12/2024   Colonoscopy  08/19/2024   FOOT EXAM  06/17/2024   HEMOGLOBIN A1C  07/09/2024   OPHTHALMOLOGY EXAM  08/26/2024   Diabetic kidney evaluation - eGFR measurement  01/06/2025   Medicare Annual Wellness (AWV)  05/04/2025   Pneumococcal Vaccine: 50+ Years  Completed   Influenza Vaccine  Completed   Hepatitis C Screening  Completed   Zoster Vaccines- Shingrix  Completed   HPV VACCINES  Aged Out   Meningococcal B Vaccine  Aged Out    Health Maintenance Items Addressed: Information provided on vaccine recommendations   Additional Screening:  Vision Screening: Recommended annual ophthalmology exams for early detection of glaucoma and other disorders of the eye. Is the patient up to date with their annual eye exam?  Yes  Who is the provider or what is the name of the office in which the patient attends annual eye exams? Dr. Joshua   Dental Screening: Recommended annual dental exams for proper oral hygiene  Community Resource Referral / Chronic Care Management: CRR required this visit?  No   CCM required this visit?  No   Plan:    I have personally reviewed and noted the following in the patient's chart:   Medical and social history Use of alcohol, tobacco or illicit drugs  Current medications and supplements including opioid prescriptions. Patient is not currently taking opioid prescriptions. Functional ability and status Nutritional status Physical activity Advanced directives List of other physicians Hospitalizations, surgeries, and ER visits in previous 12 months Vitals Screenings to include cognitive, depression, and falls Referrals and appointments  In addition, I have  reviewed and discussed with patient certain preventive protocols, quality metrics, and best practice recommendations. A written personalized care plan for preventive services as well as general preventive health recommendations were provided to patient.   Randy Orr [place], [place]   [id number]   After Visit Summary: (MyChart) Due to this being a telephonic visit, the after visit summary with patients personalized plan was offered to patient via MyChart   Notes: Nothing significant to report at this time.

## 2024-05-07 ENCOUNTER — Other Ambulatory Visit: Payer: Self-pay | Admitting: Family Medicine

## 2024-05-07 DIAGNOSIS — F5102 Adjustment insomnia: Secondary | ICD-10-CM

## 2024-05-13 DIAGNOSIS — L281 Prurigo nodularis: Secondary | ICD-10-CM | POA: Diagnosis not present

## 2024-05-13 DIAGNOSIS — L719 Rosacea, unspecified: Secondary | ICD-10-CM | POA: Diagnosis not present

## 2024-05-18 DIAGNOSIS — Z23 Encounter for immunization: Secondary | ICD-10-CM | POA: Diagnosis not present

## 2024-06-16 ENCOUNTER — Other Ambulatory Visit: Payer: Self-pay | Admitting: Medical Genetics

## 2024-06-18 NOTE — Patient Instructions (Addendum)
 It was good to see you today, I will be in touch with your labs I will reach out to GI and ask them to schedule you for your next colon  Recommend a tetanus at your convenience per pharmacy  I will refill your Mounjaro  once we get your A1c back in case we need to make an adjustment   If all is well please see me in  6 months

## 2024-06-18 NOTE — Progress Notes (Signed)
 [name] [address] [place], [place] [address] [phone]  Date:  06/21/2024   Name:  Randy Orr   DOB:  06-09-1951   MRN:  [id number]  PCP:  Watt [name], MD    Chief Complaint: Follow-up   History of Present Illness:  Randy Orr is a 73 y.o. very pleasant male patient who presents with the following:  Patient is in today for periodic follow-up.  I saw him most recently in May History of diabetes, peripheral neuropathy, hypogonadism on testosterone  replacement, hyperlipidemia He is unable to tolerate statins-is now using Repatha  as well as Zetia  We are also following him for a thyroid  nodule for which he is currently doing annual ultrasounds An incidentally discovered pulmonary nodule has been evaluated by pulmonology-nothing further needed  He follows up with urology, Dr. Roseann who saw him most recently in September He also saw cardiology, Dr. Lonni in August We can follow-up on testosterone  today, most recently checked in February  Repatha  Zetia  Metformin  XR 1000 daily Flomax  Depo testosterone - every 2 weeks, due in 4 days Mounjaro  7.5 Trazodone   Lab Results  Component Value Date   HGBA1C 8.0 (H) 01/07/2024   Tetanus due for booster Covid, flu UTD Foot exam due  needs urine micro Colon cancer screening due in the spring; he is aware - I will touch base with Dr Albertus   Discussed the use of AI scribe software for clinical note transcription with the patient, who gave verbal consent to proceed.  History of Present Illness Randy Orr is a 73 year old male who presents for follow-up and medication management.  He is scheduled for cataract surgery in ten days for both eyes. He previously underwent selective laser trabeculoplasty for glaucoma in both eyes. There was a six-week delay in the administrative process for his surgery.  He has type 2 diabetes and notes  variability in his blood sugar levels due to recent poor dietary choices, including leftover Halloween candy. His last A1c was checked in May, and he is due for another test today. He is currently on metformin  and Mounjaro  and has experienced significant weight loss, now weighing approximately 205 pounds. He is concerned about potential weight gain if his Mounjaro  dosage is adjusted. He also takes a multivitamin, vitamin D, and eye supplements, which can cause nausea if taken on an empty stomach.  He is currently taking Xanax , which was recently refilled, and trazodone  for sleep, which he finds effective in combination with Flomax . His alcohol consumption has decreased to zero, as it disrupts his sleep.  He mentions a recent back injury after getting a new office chair and plans to see a chiropractor. He is also dealing with significant family stress, including his mother's declining health and financial issues with his brother, which has led to increased stress and consideration of counseling for stress management.  He has also received a COVID-19 vaccination recently.    Patient Active Problem List   Diagnosis Date Noted   Elevated PSA 04/20/2024   Lower urinary tract symptoms (LUTS) 04/20/2024   Mobitz type I Wenckebach atrioventricular block 03/17/2024   Myalgia due to statin 03/17/2024   Nonocclusive coronary atherosclerosis of native coronary artery 03/17/2024   Thoracic aortic aneurysm 06/10/2022   Acute pain of right knee 10/16/2021   Statin myopathy 09/27/2020   Left knee pain 06/06/2016   Peripheral sensory neuropathy due to type 2 diabetes mellitus (HCC) 05/17/2014  Snoring 05/17/2014   Obesity 05/17/2014   Diabetes mellitus (HCC) 09/23/2011   Hypogonadism male 09/23/2011   Hyperlipidemia 09/23/2011    Past Medical History:  Diagnosis Date   AAA (abdominal aortic aneurysm) 06/10/22   Cataract    Diabetes mellitus without complication (HCC)    DM type 2   GERD  (gastroesophageal reflux disease)    Glaucoma    Hyperlipidemia    Low testosterone     Obesity 05/17/2014   Peripheral sensory neuropathy due to type 2 diabetes mellitus (HCC) 05/17/2014   Snoring 05/17/2014    Past Surgical History:  Procedure Laterality Date   COLONOSCOPY  07/18/2014    Social History   Tobacco Use   Smoking status: Former    Current packs/day: 0.00    Average packs/day: 0.5 packs/day for 5.0 years (2.5 ttl pk-yrs)    Types: Cigarettes    Quit date: 04/01/1983    Years since quitting: 41.2   Smokeless tobacco: Never   Tobacco comments:    smoked 30 years ago.  Substance Use Topics   Alcohol use: Yes    Alcohol/week: 1.0 standard drink of alcohol    Types: 1 Cans of beer per week   Drug use: No    Family History  Problem Relation Age of Onset   Hearing loss Mother    Heart attack Father    Diabetes Brother    Atrial fibrillation Brother    Diabetes Brother    Heart disease Brother    Colon cancer Neg Hx    Esophageal cancer Neg Hx    Rectal cancer Neg Hx    Stomach cancer Neg Hx     Allergies  Allergen Reactions   Atorvastatin Calcium  Other (See Comments)    MYALGIAS   Pravastatin  Sodium Other (See Comments)    LEG CRAMPS   Rosuvastatin  Calcium  Other (See Comments)    MYALGIA   Statins Other (See Comments)    We have tried several, cannot tolerate  - myalgias    Medication list has been reviewed and updated.  Current Outpatient Medications on File Prior to Visit  Medication Sig Dispense Refill   ALPRAZolam  (XANAX ) 0.5 MG tablet TAKE 1/2 - 1 TABLET (0.25 - 0.5 MG TOTAL) BY MOUTH TWICE A DAY AS NEEDED FOR ANXIETY 20 tablet 0   cyclobenzaprine  (FLEXERIL ) 10 MG tablet Take 1 tablet (10 mg total) by mouth daily as needed for muscle spasms. 30 tablet 0   Evolocumab  (REPATHA  SURECLICK) 140 MG/ML SOAJ INJECT 140 MG INTO THE SKIN EVERY 14 (FOURTEEN) DAYS. 6 mL 3   ezetimibe  (ZETIA ) 10 MG tablet TAKE 1 TABLET BY MOUTH EVERY DAY 90 tablet 1    l-methylfolate-B6-B12 (METANX) 3-35-2 MG TABS Take 1 tablet by mouth daily. 60 tablet 1   metFORMIN  (GLUCOPHAGE -XR) 500 MG 24 hr tablet TAKE 2 TABLETS BY MOUTH EVERY DAY WITH BREAKFAST 180 tablet 3   Multiple Vitamin (MULTIVITAMIN) tablet Take 1 tablet by mouth daily.     Multiple Vitamins-Minerals (PRESERVISION AREDS 2 PO) Take 1 tablet by mouth in the morning and at bedtime.     Needle, Disp, (HYPODERMIC NEEDLE 23GX1) 23G X 1 MISC Use to give IM injection as directed 50 each 1   sildenafil  (VIAGRA ) 50 MG tablet Take 50 mg by mouth as needed for erectile dysfunction.     tamsulosin  (FLOMAX ) 0.4 MG CAPS capsule Take 1 capsule (0.4 mg total) by mouth daily after supper. 90 capsule 3   testosterone  cypionate (DEPOTESTOSTERONE CYPIONATE) 200  MG/ML injection INJECT 0.75 ML (150 MG) INTRAMUSCULARLY EVERY 14 DAYS. 6 mL 0   tirzepatide  (MOUNJARO ) 7.5 MG/0.5ML Pen Inject 7.5 mg into the skin once a week. 6 mL 1   traZODone  (DESYREL ) 50 MG tablet TAKE 0.5-1 TABLETS BY MOUTH AT BEDTIME AS NEEDED FOR SLEEP. 90 tablet 0   UNABLE TO FIND Apply topically in the morning and at bedtime. Med Name: TRIPLE ROSACEA CREAM AZELAIC ACID15%/METRONIDAZOLE  1%/IVERMECTIN 1%     No current facility-administered medications on file prior to visit.    Review of Systems:  As per HPI- otherwise negative.   Physical Examination: Vitals:   06/21/24 0912  BP: 134/82  Pulse: 74  SpO2: 100%   Vitals:   06/21/24 0912  Weight: 206 lb 9.6 oz (93.7 kg)  Height: 6' 3 (1.905 m)   Body mass index is 25.82 kg/m. Ideal Body Weight: Weight in (lb) to have BMI = 25: 199.6  GEN: no acute distress.  Normal weight, looks well  HEENT: Atraumatic, Normocephalic.  Ears and Nose: No external deformity. CV: RRR, No M/G/R. No JVD. No thrill. No extra heart sounds. PULM: CTA B, no wheezes, crackles, rhonchi. No retractions. No resp. distress. No accessory muscle use. ABD: S, NT, ND, +BS. No rebound. No HSM. EXTR: No  c/c/e PSYCH: Normally interactive. Conversant.  Foot exam normal   Wt Readings from Last 3 Encounters:  06/21/24 206 lb 9.6 oz (93.7 kg)  05/04/24 204 lb (92.5 kg)  04/29/24 204 lb (92.5 kg)    Assessment and Plan: Dyslipidemia  Hypogonadism male  Type 2 diabetes mellitus without complication, without long-term current use of insulin (HCC)  Thyroid  nodule  Screening for deficiency anemia  Myalgia due to statin Refill mounjaro  once A1c comes in - he just filled a 3 month 7.5 rx  Assessment & Plan Type 2 diabetes mellitus with diabetic polyneuropathy Diabetes management ongoing with metformin  and tirzepatide . Last A1c was 8. Reports nausea possibly from morning medication. - Ordered A1c test today. - Continue metformin  and tirzepatide . - Adjust tirzepatide  dosage if needed based on A1c results. - Advised to space out morning medications to reduce nausea.  Hyperlipidemia Managed with ezetimibe  and evolocumab . - Continue ezetimibe  and evolocumab . - Refilled ezetimibe  prescription.  Testicular hypofunction (male hypogonadism) Testosterone  levels within normal range. PSA levels decreasing. - Ordered testosterone  level test today. - Continue testosterone  cypionate injections every 14 days.  Adjustment insomnia Insomnia managed with trazodone . - Refilled trazodone  prescription.  General Health Maintenance Upcoming cataract surgery and SLT for glaucoma. Colon cancer screening due in January. - Proceed with cataract surgery and SLT as scheduled. - Ensure colon cancer screening is scheduled for January. - Continue regular dental check-ups every six months.  Signed Harlene Schroeder, MD  Received labs   Results for orders placed or performed in visit on 06/21/24  Hemoglobin A1c   Collection Time: 06/21/24  9:37 AM  Result Value Ref Range   Hgb A1c MFr Bld 7.1 (H) 4.6 - 6.5 %  CBC   Collection Time: 06/21/24  9:37 AM  Result Value Ref Range   WBC 6.7 4.0 - 10.5 K/uL    RBC 5.02 4.22 - 5.81 Mil/uL   Platelets 234.0 150.0 - 400.0 K/uL   Hemoglobin 16.2 13.0 - 17.0 g/dL   HCT 52.7 60.9 - 47.9 %   MCV 94.0 78.0 - 100.0 fl   MCHC 34.3 30.0 - 36.0 g/dL   RDW 86.7 88.4 - 84.4 %  Comprehensive metabolic panel with GFR  Collection Time: 06/21/24  9:37 AM  Result Value Ref Range   Sodium 138 135 - 145 mEq/L   Potassium 4.2 3.5 - 5.1 mEq/L   Chloride 98 96 - 112 mEq/L   CO2 29 19 - 32 mEq/L   Glucose, Bld 141 (H) 70 - 99 mg/dL   BUN 9 6 - 23 mg/dL   Creatinine, Ser 8.84 0.40 - 1.50 mg/dL   Total Bilirubin 0.7 0.2 - 1.2 mg/dL   Alkaline Phosphatase 53 39 - 117 U/L   AST 25 0 - 37 U/L   ALT 35 0 - 53 U/L   Total Protein 6.9 6.0 - 8.3 g/dL   Albumin 4.7 3.5 - 5.2 g/dL   GFR 35.83 >39.99 mL/min   Calcium  9.2 8.4 - 10.5 mg/dL  TSH   Collection Time: 06/21/24  9:37 AM  Result Value Ref Range   TSH 1.94 0.35 - 5.50 uIU/mL  Microalbumin / creatinine urine ratio   Collection Time: 06/21/24  9:37 AM  Result Value Ref Range   Microalb, Ur 1.1 0.0 - 1.9 mg/dL   Creatinine,U 816.3 mg/dL   Microalb Creat Ratio 6.1 0.0 - 30.0 mg/g

## 2024-06-21 ENCOUNTER — Ambulatory Visit (INDEPENDENT_AMBULATORY_CARE_PROVIDER_SITE_OTHER): Admitting: Family Medicine

## 2024-06-21 ENCOUNTER — Encounter: Payer: Self-pay | Admitting: Family Medicine

## 2024-06-21 ENCOUNTER — Encounter: Payer: Self-pay | Admitting: Pharmacist

## 2024-06-21 VITALS — BP 134/82 | HR 74 | Ht 75.0 in | Wt 206.6 lb

## 2024-06-21 DIAGNOSIS — E785 Hyperlipidemia, unspecified: Secondary | ICD-10-CM

## 2024-06-21 DIAGNOSIS — E291 Testicular hypofunction: Secondary | ICD-10-CM | POA: Diagnosis not present

## 2024-06-21 DIAGNOSIS — E119 Type 2 diabetes mellitus without complications: Secondary | ICD-10-CM

## 2024-06-21 DIAGNOSIS — E041 Nontoxic single thyroid nodule: Secondary | ICD-10-CM

## 2024-06-21 DIAGNOSIS — Z7984 Long term (current) use of oral hypoglycemic drugs: Secondary | ICD-10-CM

## 2024-06-21 DIAGNOSIS — T466X5A Adverse effect of antihyperlipidemic and antiarteriosclerotic drugs, initial encounter: Secondary | ICD-10-CM | POA: Diagnosis not present

## 2024-06-21 DIAGNOSIS — Z130 Encounter for screening for diseases of the blood and blood-forming organs and certain disorders involving the immune mechanism: Secondary | ICD-10-CM | POA: Diagnosis not present

## 2024-06-21 DIAGNOSIS — M791 Myalgia, unspecified site: Secondary | ICD-10-CM | POA: Diagnosis not present

## 2024-06-21 DIAGNOSIS — F5102 Adjustment insomnia: Secondary | ICD-10-CM | POA: Diagnosis not present

## 2024-06-21 LAB — COMPREHENSIVE METABOLIC PANEL WITH GFR
ALT: 35 U/L (ref 0–53)
AST: 25 U/L (ref 0–37)
Albumin: 4.7 g/dL (ref 3.5–5.2)
Alkaline Phosphatase: 53 U/L (ref 39–117)
BUN: 9 mg/dL (ref 6–23)
CO2: 29 meq/L (ref 19–32)
Calcium: 9.2 mg/dL (ref 8.4–10.5)
Chloride: 98 meq/L (ref 96–112)
Creatinine, Ser: 1.15 mg/dL (ref 0.40–1.50)
GFR: 64.16 mL/min (ref >60.00)
Glucose, Bld: 141 mg/dL — ABNORMAL HIGH (ref 70–99)
Potassium: 4.2 meq/L (ref 3.5–5.1)
Sodium: 138 meq/L (ref 135–145)
Total Bilirubin: 0.7 mg/dL (ref 0.2–1.2)
Total Protein: 6.9 g/dL (ref 6.0–8.3)

## 2024-06-21 LAB — CBC
HCT: 47.2 % (ref 39.0–52.0)
Hemoglobin: 16.2 g/dL (ref 13.0–17.0)
MCHC: 34.3 g/dL (ref 30.0–36.0)
MCV: 94 fl (ref 78.0–100.0)
Platelets: 234 K/uL (ref 150.0–400.0)
RBC: 5.02 Mil/uL (ref 4.22–5.81)
RDW: 13.2 % (ref 11.5–15.5)
WBC: 6.7 K/uL (ref 4.0–10.5)

## 2024-06-21 LAB — MICROALBUMIN / CREATININE URINE RATIO
Creatinine,U: 183.6 mg/dL
Microalb Creat Ratio: 6.1 mg/g (ref 0.0–30.0)
Microalb, Ur: 1.1 mg/dL (ref 0.0–1.9)

## 2024-06-21 LAB — TSH: TSH: 1.94 u[IU]/mL (ref 0.35–5.50)

## 2024-06-21 LAB — HEMOGLOBIN A1C: Hgb A1c MFr Bld: 7.1 % — ABNORMAL HIGH (ref 4.6–6.5)

## 2024-06-21 MED ORDER — METFORMIN HCL ER 500 MG PO TB24: 500.0000 mg | ORAL_TABLET | Freq: Every day | ORAL | 3 refills | Status: DC

## 2024-06-21 MED ORDER — EZETIMIBE 10 MG PO TABS: 10.0000 mg | ORAL_TABLET | Freq: Every day | ORAL | 3 refills | Status: AC

## 2024-06-21 MED ORDER — TRAZODONE HCL 50 MG PO TABS: 50.0000 mg | ORAL_TABLET | Freq: Every day | ORAL | 3 refills | Status: AC

## 2024-06-21 NOTE — Progress Notes (Signed)
 Pharmacy Quality Measure Review  This patient is appearing on the insurance-providing list for being at risk of failing the adherence measure for Statin Use in Persons with Diabetes (SUPD) medications this calendar year.   Current therapy: Repatha  140mg  every 14 days and ezetimibe  10mg  daily.  Uses [employer] to offset cost of cholesterol medications. His [employer] grant is good 08/17/2023 thru 08/12/2024   Intolerant to several statins. Even tried to take CoEnzyme Q10 to see if helped with muscle pain but did not help.  He has taken rosuvastatin  5mg  daily and every 3 days - had leg cramps, myalgias and felt horrible. (07/04/2019 to 11/04/2019) pravastatin  40mg  09/23/2011 to 09/02/2012 - stopped due to leg cramps / myalgias.  atorvastatin - dose unknown - stopped due to myopathy   Coronary Calcium  Score elevated.  CT of chest 06/06/2022 showed an aneurysm.   Patient has appointment today with Dr Watt. Will send message about using exclusion code for SUPD since patient has tried several statins and experienced adverse effects.  He has statin myopathy G72.0, T46.6X5A] on problem list already.  Continue Repatha  and ezetimibe . Will contact patient in December 2025 to start renewal of [employer] Grant.   Madelin Ray, PharmD Clinical Pharmacist [facility]

## 2024-06-22 ENCOUNTER — Telehealth: Payer: Self-pay

## 2024-06-22 NOTE — Telephone Encounter (Signed)
Left message for patient to return my call to schedule colonoscopy.

## 2024-06-22 NOTE — Telephone Encounter (Signed)
-----   Message from Gordy HERO Pyrtle sent at 06/22/2024  1:02 PM EST ----- Surveillance colon for Jan 2026 Thanks JMP ----- Message ----- From: Watt [name], MD Sent: 06/21/2024   9:26 AM EST To: Gordy HERO Starch, MD  Hi Gordy- this pt is due for a colon in January.  Would you be so kind as to ask your staff to reach out to him and get him on the schedule?  Thank you! Jess

## 2024-06-23 NOTE — Telephone Encounter (Signed)
 Returned patient's call. Patient scheduled for colonoscopy with Dr. Albertus on 08/19/23 at 8:00 am and pre-visi on 07/26/24 at 8:00 am.

## 2024-06-23 NOTE — Telephone Encounter (Signed)
 Patient returning call. Please advise.,

## 2024-06-24 ENCOUNTER — Encounter: Payer: Self-pay | Admitting: Family Medicine

## 2024-06-24 LAB — TESTOSTERONE TOTAL,FREE,BIO, MALES
Albumin: 5.1 g/dL (ref 3.6–5.1)
Sex Hormone Binding: 48 nmol/L (ref 22–77)
Testosterone, Bioavailable: 66.9 ng/dL (ref 15.0–150.0)
Testosterone, Free: 28.9 pg/mL (ref 6.0–73.0)
Testosterone: 319 ng/dL (ref 250–827)

## 2024-07-01 DIAGNOSIS — H5371 Glare sensitivity: Secondary | ICD-10-CM | POA: Diagnosis not present

## 2024-07-01 DIAGNOSIS — H2511 Age-related nuclear cataract, right eye: Secondary | ICD-10-CM | POA: Diagnosis not present

## 2024-07-01 DIAGNOSIS — H2513 Age-related nuclear cataract, bilateral: Secondary | ICD-10-CM | POA: Diagnosis not present

## 2024-07-02 DIAGNOSIS — H2512 Age-related nuclear cataract, left eye: Secondary | ICD-10-CM | POA: Diagnosis not present

## 2024-07-07 NOTE — Progress Notes (Signed)
 Bain Whichard                                          MRN: [id number]   07/07/2024   The [facility] Quality Team Specialist reviewed this patient medical record for the purposes of chart review for care gap closure. The following were reviewed: abstraction for care gap closure-kidney health evaluation for diabetes:eGFR  and uACR.    [facility]BCI Quality Team

## 2024-07-15 DIAGNOSIS — H2513 Age-related nuclear cataract, bilateral: Secondary | ICD-10-CM | POA: Diagnosis not present

## 2024-07-15 DIAGNOSIS — H52202 Unspecified astigmatism, left eye: Secondary | ICD-10-CM | POA: Diagnosis not present

## 2024-07-15 DIAGNOSIS — H2512 Age-related nuclear cataract, left eye: Secondary | ICD-10-CM | POA: Diagnosis not present

## 2024-07-20 ENCOUNTER — Other Ambulatory Visit: Payer: Self-pay | Admitting: Family Medicine

## 2024-07-20 DIAGNOSIS — E291 Testicular hypofunction: Secondary | ICD-10-CM

## 2024-07-22 ENCOUNTER — Other Ambulatory Visit: Payer: Medicare HMO | Admitting: Pharmacist

## 2024-07-22 NOTE — Progress Notes (Unsigned)
° °  07/22/2024 Name: Randy Orr MRN: [id number] DOB: 09/15/1950  No chief complaint on file.   {Visit Type:26650}   Subjective:  Care Team: Primary Care Provider: Watt [name], MD ; Next Scheduled Visit: *** {careteamprovider:27366}  Medication Access/Adherence  Current Pharmacy:  [id number] [address], [place] [phone] [place] [address] Phone: [phone] Fax: [phone]   Patient reports affordability concerns with their medications: {YES/NO:21197} Patient reports access/transportation concerns to their pharmacy: {YES/NO:21197} Patient reports adherence concerns with their medications:  {YES/NO:21197} ***   {Pharmacy S/O Choices:26420}   Objective:  Lab Results  Component Value Date   HGBA1C 7.1 (H) 06/21/2024    Lab Results  Component Value Date   CREATININE 1.15 06/21/2024   BUN 9 06/21/2024   NA 138 06/21/2024   K 4.2 06/21/2024   CL 98 06/21/2024   CO2 29 06/21/2024    Lab Results  Component Value Date   CHOL 82 (L) 03/09/2024   HDL 47 03/09/2024   LDLCALC 20 03/09/2024   LDLDIRECT 146.0 07/01/2019   TRIG 68 03/09/2024   CHOLHDL 1.7 03/09/2024    Medications Reviewed Today   Medications were not reviewed in this encounter       Assessment/Plan:   {Pharmacy A/P Choices:26421}  Follow Up Plan: ***  ***

## 2024-07-26 ENCOUNTER — Encounter

## 2024-07-26 ENCOUNTER — Ambulatory Visit

## 2024-07-26 ENCOUNTER — Telehealth: Payer: Self-pay

## 2024-07-26 VITALS — Ht 75.0 in | Wt 205.0 lb

## 2024-07-26 DIAGNOSIS — Z860100 Personal history of colon polyps, unspecified: Secondary | ICD-10-CM

## 2024-07-26 MED ORDER — NA SULFATE-K SULFATE-MG SULF 17.5-3.13-1.6 GM/177ML PO SOLN: 1.0000 | Freq: Once | ORAL | 0 refills | Status: AC

## 2024-07-26 NOTE — Telephone Encounter (Signed)
 Patient rescheduled PV for 1430 today.

## 2024-07-26 NOTE — Progress Notes (Signed)
 No egg or soy allergy known to patient  No issues known to pt with past sedation with any surgeries or procedures Patient denies ever being told they had issues or difficulty with intubation  No FH of Malignant Hyperthermia Pt is on diet pills, Mounjaro  and hold instructions provided Pt is not on  home 02  Pt is not on blood thinners  Pt has intermittent issues with constipation - no meds taken  No A fib or A flutter Have any cardiac testing pending--No Pt can ambulate  Pt denies use of chewing tobacco Discussed diabetic I weight loss medication holds Discussed NSAID holds Checked BMI Pt instructed to use Singlecare.com or GoodRx for a price reduction on prep  Patient's chart reviewed by Norleen Schillings CNRA prior to previsit and patient appropriate for the [facility].  Pre visit completed and red dot placed by patient's name on their procedure day (on provider's schedule).

## 2024-07-26 NOTE — Telephone Encounter (Signed)
 Multiple telephone calls placed to patient to complete scheduled pre-visit appointment, all unsuccessful.  Message left for patient to call back before 4:30 pm today to reschedule the PV appointment to avoid cancellation of his colonoscopy procedure which is currently scheduled for 08/18/24.  NoShow letter will be sent out to the patient.

## 2024-07-26 NOTE — Telephone Encounter (Signed)
 Patient returned call and was rescheduled  Please advise  Thank you

## 2024-07-27 ENCOUNTER — Other Ambulatory Visit

## 2024-08-04 ENCOUNTER — Other Ambulatory Visit: Payer: Self-pay | Admitting: Family Medicine

## 2024-08-04 DIAGNOSIS — E119 Type 2 diabetes mellitus without complications: Secondary | ICD-10-CM

## 2024-08-10 ENCOUNTER — Other Ambulatory Visit: Payer: Self-pay | Admitting: Urology

## 2024-08-14 ENCOUNTER — Encounter: Payer: Self-pay | Admitting: Urology

## 2024-08-16 ENCOUNTER — Other Ambulatory Visit: Payer: Self-pay | Admitting: Urology

## 2024-08-16 ENCOUNTER — Encounter: Payer: Self-pay | Admitting: Internal Medicine

## 2024-08-16 MED ORDER — TAMSULOSIN HCL 0.4 MG PO CAPS: 0.4000 mg | ORAL_CAPSULE | Freq: Every day | ORAL | 3 refills | Status: AC

## 2024-08-18 ENCOUNTER — Encounter: Payer: Self-pay | Admitting: Internal Medicine

## 2024-08-18 ENCOUNTER — Ambulatory Visit: Admitting: Internal Medicine

## 2024-08-18 VITALS — BP 135/72 | HR 64 | Temp 97.7°F | Resp 16 | Ht 75.0 in | Wt 205.0 lb

## 2024-08-18 DIAGNOSIS — Z1211 Encounter for screening for malignant neoplasm of colon: Secondary | ICD-10-CM

## 2024-08-18 DIAGNOSIS — K635 Polyp of colon: Secondary | ICD-10-CM | POA: Diagnosis not present

## 2024-08-18 DIAGNOSIS — Z860101 Personal history of adenomatous and serrated colon polyps: Secondary | ICD-10-CM | POA: Diagnosis not present

## 2024-08-18 DIAGNOSIS — D120 Benign neoplasm of cecum: Secondary | ICD-10-CM

## 2024-08-18 DIAGNOSIS — Z860100 Personal history of colon polyps, unspecified: Secondary | ICD-10-CM

## 2024-08-18 MED ORDER — SODIUM CHLORIDE 0.9 % IV SOLN: 500.0000 mL | INTRAVENOUS | Status: DC

## 2024-08-18 NOTE — Patient Instructions (Addendum)
 Continue present medications. Await pathology results. Repeat colonoscopy in 5 years for surveillance.  Please read over handout  YOU HAD AN ENDOSCOPIC PROCEDURE TODAY AT THE [facility]:   Refer to the procedure report that was given to you for any specific questions about what was found during the examination.  If the procedure report does not answer your questions, please call your gastroenterologist to clarify.  If you requested that your care partner not be given the details of your procedure findings, then the procedure report has been included in a sealed envelope for you to review at your convenience later.  YOU SHOULD EXPECT: Some feelings of bloating in the abdomen. Passage of more gas than usual.  Walking can help get rid of the air that was put into your GI tract during the procedure and reduce the bloating. If you had a lower endoscopy (such as a colonoscopy or flexible sigmoidoscopy) you may notice spotting of blood in your stool or on the toilet paper. If you underwent a bowel prep for your procedure, you may not have a normal bowel movement for a few days.  Please Note:  You might notice some irritation and congestion in your nose or some drainage.  This is from the oxygen used during your procedure.  There is no need for concern and it should clear up in a day or so.  SYMPTOMS TO REPORT IMMEDIATELY:  Following lower endoscopy (colonoscopy or flexible sigmoidoscopy):  Excessive amounts of blood in the stool  Significant tenderness or worsening of abdominal pains  Swelling of the abdomen that is new, acute  Fever of 100F or higher For urgent or emergent issues, a gastroenterologist can be reached at any hour by calling [phone]. Do not use MyChart messaging for urgent concerns.    DIET:  We do recommend a small meal at first, but then you may proceed to your regular diet.  Drink plenty of fluids but you should avoid alcoholic beverages for 24  hours.  ACTIVITY:  You should plan to take it easy for the rest of today and you should NOT DRIVE or use heavy machinery until tomorrow (because of the sedation medicines used during the test).    FOLLOW UP: Our staff will call the number listed on your records the next business day following your procedure.  We will call around 7:15- 8:00 am to check on you and address any questions or concerns that you may have regarding the information given to you following your procedure. If we do not reach you, we will leave a message.     If any biopsies were taken you will be contacted by phone or by letter within the next 1-3 weeks.  Please call us  at [phone] if you have not heard about the biopsies in 3 weeks.    SIGNATURES/CONFIDENTIALITY: You and/or your care partner have signed paperwork which will be entered into your electronic medical record.  These signatures attest to the fact that that the information above on your After Visit Summary has been reviewed and is understood.  Full responsibility of the confidentiality of this discharge information lies with you and/or your care-partner.

## 2024-08-18 NOTE — Progress Notes (Signed)
 "   GASTROENTEROLOGY PROCEDURE H&P NOTE   Primary Care Physician: Watt [name], MD    Reason for Procedure:  History of sessile serrated colon polyps  Plan:    Colonoscopy  Patient is appropriate for endoscopic procedure(s) in the ambulatory ([facility]) setting.  The nature of the procedure, as well as the risks, benefits, and alternatives were carefully and thoroughly reviewed with the patient. Ample time for discussion and questions allowed.  All questions were answered. The patient understood, was satisfied, and agreed with the plan to proceed.    HPI: Randy Orr is a 74 y.o. male who presents for surveillance colonoscopy.  Medical history as below.  Tolerated the prep.  No recent chest pain or shortness of breath.  No abdominal pain today.  Past Medical History:  Diagnosis Date   AAA (abdominal aortic aneurysm) 06/10/22   Cataract    Diabetes mellitus without complication (HCC)    DM type 2   GERD (gastroesophageal reflux disease)    Glaucoma    Hyperlipidemia    Low testosterone     Obesity 05/17/2014   Peripheral sensory neuropathy due to type 2 diabetes mellitus (HCC) 05/17/2014   Snoring 05/17/2014    Past Surgical History:  Procedure Laterality Date   CATARACT EXTRACTION Bilateral    both November   COLONOSCOPY  07/18/2014   KNEE SURGERY Right    torn meniscus 2023    Prior to Admission medications  Medication Sig Start Date End Date Taking? Authorizing Provider  l-methylfolate-B6-B12 (METANX) 3-35-2 MG TABS Take 1 tablet by mouth daily. 05/17/14  Yes Dohmeier, Dedra, MD  metFORMIN  (GLUCOPHAGE -XR) 500 MG 24 hr tablet TAKE 2 TABLETS BY MOUTH EVERY DAY WITH BREAKFAST 08/04/24  Yes Copland, [name], MD  ALPRAZolam  (XANAX ) 0.5 MG tablet TAKE 1/2 - 1 TABLET (0.25 - 0.5 MG TOTAL) BY MOUTH TWICE A DAY AS NEEDED FOR ANXIETY 04/05/24   Copland, [name], MD  brimonidine  (ALPHAGAN ) 0.2 % ophthalmic solution Place 1 drop into the right eye 2 (two) times daily.  07/09/24   [provider]  cyclobenzaprine  (FLEXERIL ) 10 MG tablet Take 1 tablet (10 mg total) by mouth daily as needed for muscle spasms. 11/04/19   Copland, Jessica C, MD  Evolocumab  (REPATHA  SURECLICK) 140 MG/ML SOAJ INJECT 140 MG INTO THE SKIN EVERY 14 (FOURTEEN) DAYS. 02/23/24   Lonni Slain, MD  ezetimibe  (ZETIA ) 10 MG tablet Take 1 tablet (10 mg total) by mouth daily. 06/21/24   Copland, Jessica C, MD  gatifloxacin (ZYMAXID) 0.5 % SOLN Place 1 drop into the left eye 4 (four) times daily. 07/02/24   [provider]  ketorolac (ACULAR) 0.5 % ophthalmic solution Place 1 drop into the left eye 2 (two) times daily. 07/02/24   [provider]  Needle, Disp, (HYPODERMIC NEEDLE 23GX1) 23G X 1 MISC Use to give IM injection as directed 10/28/21   Copland, Jessica C, MD  prednisoLONE acetate (PRED FORTE) 1 % ophthalmic suspension Place 1 drop into the right eye 4 (four) times daily. 07/02/24   [provider]  sildenafil  (VIAGRA ) 50 MG tablet Take 50 mg by mouth as needed for erectile dysfunction.    [provider]  tamsulosin  (FLOMAX ) 0.4 MG CAPS capsule Take 1 capsule (0.4 mg total) by mouth daily after supper. 08/16/24   Stoneking, Adine PARAS., MD  testosterone  cypionate (DEPOTESTOSTERONE CYPIONATE) 200 MG/ML injection INJECT 0.75 ML (150 MG) INTRAMUSCULARLY EVERY 14 DAYS. 07/20/24   Copland, [name], MD  tirzepatide  (MOUNJARO ) 7.5  MG/0.5ML Pen Inject 7.5 mg into the skin once a week. 01/26/24   Copland, [name], MD  traZODone  (DESYREL ) 50 MG tablet Take 1 tablet (50 mg total) by mouth at bedtime. 06/21/24   Copland, [name], MD  UNABLE TO FIND Apply topically in the morning and at bedtime. Med Name: TRIPLE ROSACEA CREAM AZELAIC ACID15%/METRONIDAZOLE  1%/IVERMECTIN 1%    [provider]    Current Outpatient Medications  Medication Sig Dispense Refill   l-methylfolate-B6-B12 (METANX) 3-35-2 MG TABS Take 1 tablet by mouth daily. 60  tablet 1   metFORMIN  (GLUCOPHAGE -XR) 500 MG 24 hr tablet TAKE 2 TABLETS BY MOUTH EVERY DAY WITH BREAKFAST 180 tablet 3   ALPRAZolam  (XANAX ) 0.5 MG tablet TAKE 1/2 - 1 TABLET (0.25 - 0.5 MG TOTAL) BY MOUTH TWICE A DAY AS NEEDED FOR ANXIETY 20 tablet 0   brimonidine  (ALPHAGAN ) 0.2 % ophthalmic solution Place 1 drop into the right eye 2 (two) times daily.     cyclobenzaprine  (FLEXERIL ) 10 MG tablet Take 1 tablet (10 mg total) by mouth daily as needed for muscle spasms. 30 tablet 0   Evolocumab  (REPATHA  SURECLICK) 140 MG/ML SOAJ INJECT 140 MG INTO THE SKIN EVERY 14 (FOURTEEN) DAYS. 6 mL 3   ezetimibe  (ZETIA ) 10 MG tablet Take 1 tablet (10 mg total) by mouth daily. 90 tablet 3   gatifloxacin (ZYMAXID) 0.5 % SOLN Place 1 drop into the left eye 4 (four) times daily.     ketorolac (ACULAR) 0.5 % ophthalmic solution Place 1 drop into the left eye 2 (two) times daily.     Needle, Disp, (HYPODERMIC NEEDLE 23GX1) 23G X 1 MISC Use to give IM injection as directed 50 each 1   prednisoLONE acetate (PRED FORTE) 1 % ophthalmic suspension Place 1 drop into the right eye 4 (four) times daily.     sildenafil  (VIAGRA ) 50 MG tablet Take 50 mg by mouth as needed for erectile dysfunction.     tamsulosin  (FLOMAX ) 0.4 MG CAPS capsule Take 1 capsule (0.4 mg total) by mouth daily after supper. 90 capsule 3   testosterone  cypionate (DEPOTESTOSTERONE CYPIONATE) 200 MG/ML injection INJECT 0.75 ML (150 MG) INTRAMUSCULARLY EVERY 14 DAYS. 6 mL 0   tirzepatide  (MOUNJARO ) 7.5 MG/0.5ML Pen Inject 7.5 mg into the skin once a week. 6 mL 1   traZODone  (DESYREL ) 50 MG tablet Take 1 tablet (50 mg total) by mouth at bedtime. 90 tablet 3   UNABLE TO FIND Apply topically in the morning and at bedtime. Med Name: TRIPLE ROSACEA CREAM AZELAIC ACID15%/METRONIDAZOLE  1%/IVERMECTIN 1%     Current Facility-Administered Medications  Medication Dose Route Frequency Provider Last Rate Last Admin   0.9 %  sodium chloride  infusion  500 mL Intravenous  Continuous Makarios Madlock, Gordy HERO, MD        Allergies as of 08/18/2024 - Review Complete 08/18/2024  Allergen Reaction Noted   Atorvastatin calcium  Other (See Comments) 07/12/2022   Pravastatin  sodium Other (See Comments) 07/12/2022   Rosuvastatin  calcium  Other (See Comments) 07/12/2022   Statins Other (See Comments) 11/04/2019    Family History  Problem Relation Age of Onset   Hearing loss Mother    Heart attack Father    Diabetes Brother    Atrial fibrillation Brother    Diabetes Brother    Heart disease Brother    Colon cancer Neg Hx    Esophageal cancer Neg Hx    Rectal cancer Neg Hx    Stomach cancer Neg Hx  Social History   Socioeconomic History   Marital status: Married    Spouse name: Not on file   Number of children: 1   Years of education: AAS   Highest education level: Bachelor's degree (e.g., BA, AB, BS)  Occupational History   Occupation: SECURITY OFFICER    Employer: [employer]  Tobacco Use   Smoking status: Former    Current packs/day: 0.00    Average packs/day: 0.5 packs/day for 5.0 years (2.5 ttl pk-yrs)    Types: Cigarettes    Start date: 39    Quit date: 04/01/1983    Years since quitting: 41.4   Smokeless tobacco: Never   Tobacco comments:    smoked 30 years ago.  Vaping Use   Vaping status: Never Used  Substance and Sexual Activity   Alcohol use: Yes    Alcohol/week: 1.0 standard drink of alcohol    Types: 1 Cans of beer per week    Comment: rarely   Drug use: No   Sexual activity: Yes    Birth control/protection: None  Other Topics Concern   Not on file  Social History Narrative   Not on file   Social Drivers of Health   Tobacco Use: Medium Risk (08/18/2024)   Patient History    Smoking Tobacco Use: Former    Smokeless Tobacco Use: Never    Passive Exposure: Not on file  Financial Resource Strain: Low Risk (06/14/2024)   Overall Financial Resource Strain (CARDIA)    Difficulty of Paying Living Expenses: Not hard at all  Food  Insecurity: No Food Insecurity (06/14/2024)   Epic    Worried About Programme Researcher, Broadcasting/film/video in the Last Year: Never true    Ran Out of Food in the Last Year: Never true  Transportation Needs: No Transportation Needs (06/14/2024)   Epic    Lack of Transportation (Medical): No    Lack of Transportation (Non-Medical): No  Physical Activity: Unknown (06/14/2024)   Exercise Vital Sign    Days of Exercise per Week: 1 day    Minutes of Exercise per Session: Not on file  Recent Concern: Physical Activity - Insufficiently Active (04/27/2024)   Exercise Vital Sign    Days of Exercise per Week: 3 days    Minutes of Exercise per Session: 30 min  Stress: No Stress Concern Present (06/14/2024)   [employer] of Occupational Health - Occupational Stress Questionnaire    Feeling of Stress: Only a little  Social Connections: Moderately Integrated (06/14/2024)   Social Connection and Isolation Panel    Frequency of Communication with Friends and Family: More than three times a week    Frequency of Social Gatherings with Friends and Family: Three times a week    Attends Religious Services: Never    Active Member of Clubs or Organizations: Yes    Attends Banker Meetings: 1 to 4 times per year    Marital Status: Married  Catering Manager Violence: Not At Risk (05/04/2024)   Epic    Fear of Current or Ex-Partner: No    Emotionally Abused: No    Physically Abused: No    Sexually Abused: No  Depression (PHQ2-9): Low Risk (06/21/2024)   Depression (PHQ2-9)    PHQ-2 Score: 0  Alcohol Screen: Low Risk (06/14/2024)   Alcohol Screen    Last Alcohol Screening Score (AUDIT): 1  Housing: Low Risk (06/14/2024)   Epic    Unable to Pay for Housing in the Last Year: No  Number of Times Moved in the Last Year: 0    Homeless in the Last Year: No  Utilities: Not At Risk (05/04/2024)   Epic    Threatened with loss of utilities: No  Health Literacy: Adequate Health Literacy (05/04/2024)   B1300 Health  Literacy    Frequency of need for help with medical instructions: Never    Physical Exam: Vital signs in last 24 hours: @BP  (!) 154/83   Pulse 80   Temp 97.7 F (36.5 C)   Ht 6' 3 (1.905 m)   Wt 205 lb (93 kg)   SpO2 98%   BMI 25.62 kg/m  GEN: NAD EYE: Sclerae anicteric ENT: MMM CV: Non-tachycardic Pulm: CTA b/l GI: Soft, NT/ND NEURO:  Alert & Oriented x 3   Gordy Starch, MD [facility]  08/18/2024 8:43 AM  "

## 2024-08-18 NOTE — Progress Notes (Signed)
 Pt's states no medical or surgical changes since previsit or office visit.

## 2024-08-18 NOTE — Progress Notes (Signed)
 Called to room to assist during endoscopic procedure.  Patient ID and intended procedure confirmed with present staff. Received instructions for my participation in the procedure from the performing physician.

## 2024-08-18 NOTE — Op Note (Signed)
 [facility] Patient Name: Randy Orr Procedure Date: 08/18/2024 8:00 AM MRN: [id number] Endoscopist: Gordy CHRISTELLA Starch , MD, [id number] Age: 74 Referring MD:  Date of Birth: [birth year] Gender: Male Account #: [id number] Procedure:                Colonoscopy Indications:              High risk colon cancer surveillance: Personal                            history of multiple adenomas, Last colonoscopy:                            January 2021 (TA x 1, SSP x 1), Dec 2015 (SSP x 2) Medicines:                Monitored Anesthesia Care Procedure:                Pre-Anesthesia Assessment:                           - Prior to the procedure, a History and Physical                            was performed, and patient medications and                            allergies were reviewed. The patient's tolerance of                            previous anesthesia was also reviewed. The risks                            and benefits of the procedure and the sedation                            options and risks were discussed with the patient.                            All questions were answered, and informed consent                            was obtained. Prior Anticoagulants: The patient has                            taken no anticoagulant or antiplatelet agents. ASA                            Grade Assessment: II - A patient with mild systemic                            disease. After reviewing the risks and benefits,                            the patient was deemed in satisfactory condition to  undergo the procedure.                           After obtaining informed consent, the colonoscope                            was passed under direct vision. Throughout the                            procedure, the patient's blood pressure, pulse, and                            oxygen saturations were monitored continuously. The                            Olympus Scope SN:  [id number] was introduced through                            the anus and advanced to the cecum, identified by                            appendiceal orifice and ileocecal valve. The                            colonoscopy was performed without difficulty. The                            patient tolerated the procedure well. The quality                            of the bowel preparation was good. The ileocecal                            valve, appendiceal orifice, and rectum were                            photographed. Scope In: 9:03:45 AM Scope Out: 9:20:12 AM Scope Withdrawal Time: 0 hours 12 minutes 39 seconds  Total Procedure Duration: 0 hours 16 minutes 27 seconds  Findings:                 The digital rectal exam was normal.                           A 2 mm polyp was found in the cecum. The polyp was                            sessile. The polyp was removed with a cold snare.                            Resection and retrieval were complete.                           The exam was otherwise without abnormality on  direct and retroflexion views. Complications:            No immediate complications. Estimated Blood Loss:     Estimated blood loss: none. Impression:               - One 2 mm polyp in the cecum, removed with a cold                            snare. Resected and retrieved.                           - The examination was otherwise normal on direct                            and retroflexion views. Recommendation:           - Patient has a contact number available for                            emergencies. The signs and symptoms of potential                            delayed complications were discussed with the                            patient. Return to normal activities tomorrow.                            Written discharge instructions were provided to the                            patient.                           - Resume previous diet.                            - Continue present medications.                           - Await pathology results.                           - Repeat colonoscopy in 5 years for surveillance. Gordy CHRISTELLA Starch, MD 08/18/2024 9:29:11 AM This report has been signed electronically.

## 2024-08-18 NOTE — Progress Notes (Signed)
 To pacu, VSS. Report to [email or web address]

## 2024-08-19 ENCOUNTER — Telehealth: Payer: Self-pay | Admitting: *Deleted

## 2024-08-19 NOTE — Telephone Encounter (Signed)
" °  Follow up Call-     08/18/2024    8:10 AM  Call back number  Post procedure Call Back phone  # [phone]  Permission to leave phone message Yes     Patient questions:  Do you have a fever, pain , or abdominal swelling? No. Pain Score  0 *  Have you tolerated food without any problems? Yes.    Have you been able to return to your normal activities? Yes.    Do you have any questions about your discharge instructions: Diet   No. Medications  No. Follow up visit  No.  Do you have questions or concerns about your Care? No.  Actions: * If pain score is 4 or above: No action needed, pain <4.   "

## 2024-08-20 ENCOUNTER — Ambulatory Visit: Payer: Self-pay | Admitting: Internal Medicine

## 2024-08-20 LAB — SURGICAL PATHOLOGY

## 2024-09-07 ENCOUNTER — Other Ambulatory Visit: Payer: Self-pay | Admitting: Family Medicine

## 2024-09-07 DIAGNOSIS — E119 Type 2 diabetes mellitus without complications: Secondary | ICD-10-CM

## 2024-10-18 ENCOUNTER — Ambulatory Visit: Admitting: Urology
